# Patient Record
Sex: Male | Born: 1955 | Race: White | Hispanic: No | Marital: Married | Smoking: Former smoker
Health system: Southern US, Community
[De-identification: ages and names within clinical notes are randomized; demographics above are authoritative.]

## PROBLEM LIST (undated history)

## (undated) DIAGNOSIS — N189 Chronic kidney disease, unspecified: Secondary | ICD-10-CM

## (undated) DIAGNOSIS — E785 Hyperlipidemia, unspecified: Secondary | ICD-10-CM

## (undated) DIAGNOSIS — E041 Nontoxic single thyroid nodule: Secondary | ICD-10-CM

## (undated) DIAGNOSIS — I639 Cerebral infarction, unspecified: Secondary | ICD-10-CM

## (undated) DIAGNOSIS — I2510 Atherosclerotic heart disease of native coronary artery without angina pectoris: Secondary | ICD-10-CM

## (undated) DIAGNOSIS — Z87442 Personal history of urinary calculi: Secondary | ICD-10-CM

## (undated) DIAGNOSIS — M1990 Unspecified osteoarthritis, unspecified site: Secondary | ICD-10-CM

## (undated) DIAGNOSIS — I739 Peripheral vascular disease, unspecified: Secondary | ICD-10-CM

## (undated) DIAGNOSIS — I219 Acute myocardial infarction, unspecified: Secondary | ICD-10-CM

## (undated) DIAGNOSIS — Z8673 Personal history of transient ischemic attack (TIA), and cerebral infarction without residual deficits: Secondary | ICD-10-CM

## (undated) HISTORY — DX: Hyperlipidemia, unspecified: E78.5

## (undated) HISTORY — DX: Personal history of transient ischemic attack (TIA), and cerebral infarction without residual deficits: Z86.73

## (undated) HISTORY — DX: Atherosclerotic heart disease of native coronary artery without angina pectoris: I25.10

## (undated) HISTORY — DX: Nontoxic single thyroid nodule: E04.1

## (undated) HISTORY — PX: COLONOSCOPY: SHX174

## (undated) HISTORY — DX: Peripheral vascular disease, unspecified: I73.9

## (undated) HISTORY — DX: Cerebral infarction, unspecified: I63.9

---

## 1997-03-28 DIAGNOSIS — I219 Acute myocardial infarction, unspecified: Secondary | ICD-10-CM

## 1997-03-28 HISTORY — DX: Acute myocardial infarction, unspecified: I21.9

## 2001-03-28 HISTORY — PX: CORONARY ANGIOPLASTY WITH STENT PLACEMENT: SHX49

## 2001-05-03 ENCOUNTER — Ambulatory Visit (HOSPITAL_COMMUNITY): Admission: RE | Admit: 2001-05-03 | Discharge: 2001-05-04 | Payer: Self-pay | Admitting: Dermatology

## 2003-11-26 ENCOUNTER — Emergency Department (HOSPITAL_COMMUNITY): Admission: EM | Admit: 2003-11-26 | Discharge: 2003-11-26 | Payer: Self-pay | Admitting: Family Medicine

## 2004-08-14 ENCOUNTER — Observation Stay (HOSPITAL_COMMUNITY): Admission: EM | Admit: 2004-08-14 | Discharge: 2004-08-14 | Payer: Self-pay | Admitting: Family Medicine

## 2004-08-16 ENCOUNTER — Ambulatory Visit (HOSPITAL_COMMUNITY): Admission: AD | Admit: 2004-08-16 | Discharge: 2004-08-16 | Payer: Self-pay | Admitting: Urology

## 2006-06-02 ENCOUNTER — Observation Stay (HOSPITAL_COMMUNITY): Admission: EM | Admit: 2006-06-02 | Discharge: 2006-06-03 | Payer: Self-pay | Admitting: Emergency Medicine

## 2010-08-04 ENCOUNTER — Ambulatory Visit: Payer: Self-pay | Admitting: Cardiology

## 2010-08-04 ENCOUNTER — Encounter: Payer: Self-pay | Admitting: Cardiology

## 2010-08-04 DIAGNOSIS — E119 Type 2 diabetes mellitus without complications: Secondary | ICD-10-CM | POA: Insufficient documentation

## 2010-08-04 DIAGNOSIS — Z8673 Personal history of transient ischemic attack (TIA), and cerebral infarction without residual deficits: Secondary | ICD-10-CM | POA: Insufficient documentation

## 2010-08-04 DIAGNOSIS — I2510 Atherosclerotic heart disease of native coronary artery without angina pectoris: Secondary | ICD-10-CM | POA: Insufficient documentation

## 2010-08-04 DIAGNOSIS — E785 Hyperlipidemia, unspecified: Secondary | ICD-10-CM | POA: Insufficient documentation

## 2010-08-04 DIAGNOSIS — R0789 Other chest pain: Secondary | ICD-10-CM | POA: Insufficient documentation

## 2010-08-09 ENCOUNTER — Ambulatory Visit (INDEPENDENT_AMBULATORY_CARE_PROVIDER_SITE_OTHER): Payer: Managed Care, Other (non HMO) | Admitting: Cardiology

## 2010-08-09 ENCOUNTER — Encounter: Payer: Self-pay | Admitting: Cardiology

## 2010-08-09 VITALS — BP 122/80 | HR 66 | Ht 67.0 in | Wt 201.2 lb

## 2010-08-09 DIAGNOSIS — I2510 Atherosclerotic heart disease of native coronary artery without angina pectoris: Secondary | ICD-10-CM

## 2010-08-09 DIAGNOSIS — E785 Hyperlipidemia, unspecified: Secondary | ICD-10-CM

## 2010-08-09 NOTE — Assessment & Plan Note (Addendum)
He is asymptomatic at this point. We will continue with risk factor modification and antiplatelet therapy. I will plan on followup again in one year. I would anticipate a stress test at that time. I have encouraged him to increase his aerobic activity.

## 2010-08-09 NOTE — Assessment & Plan Note (Signed)
>>  ASSESSMENT AND PLAN FOR DYSLIPIDEMIA WRITTEN ON 08/09/2010  6:12 PM BY [place], Demetria Pore, MD  We will obtain a copy of his most recent lab work. He is on appropriate statin therapy.

## 2010-08-09 NOTE — Assessment & Plan Note (Signed)
We will obtain a copy of his most recent lab work. He is on appropriate statin therapy.

## 2010-08-09 NOTE — Patient Instructions (Signed)
Try and increase your walking.  We will request a copy of your lab work from Dr. Catha Gosselin.  Continue current medications.  Call if you have any worsening chest pain or shortness of breath.  I will see you back again in 1 year.

## 2010-08-09 NOTE — Progress Notes (Signed)
Mario Arnold Date of Birth: [birth year]   History of Present Illness: [name] is seen for followup today. He was last seen in August of 2010. He states he has done very well since then without any significant cardiac complaints. Occasionally he will note some shortness of breath on exertion he has had no significant chest pain. He reports his diabetes has been under good control. Based on his most recent lab work he was switched to Lipitor.  Current Outpatient Prescriptions on File Prior to Visit  Medication Sig Dispense Refill  . atorvastatin (LIPITOR) 80 MG tablet Take 80 mg by mouth daily.        . Choline Fenofibrate (TRILIPIX) 135 MG capsule Take 135 mg by mouth daily.        [name] dipyridamole-aspirin (AGGRENOX) 25-200 MG per 12 hr capsule Take 1 capsule by mouth 2 (two) times daily.        [name] glyBURIDE (DIABETA) 5 MG tablet Take 5 mg by mouth daily with breakfast.        . metFORMIN (GLUCOPHAGE) 500 MG tablet Take 500 mg by mouth 2 (two) times daily with a meal.        . metoprolol tartrate (LOPRESSOR) 25 MG tablet Take 25 mg by mouth 2 (two) times daily.        . Omega-3 Fatty Acids (FISH OIL) 1000 MG CAPS Take by mouth 4 (four) times daily.        . nitroGLYCERIN (NITROSTAT) 0.4 MG SL tablet Place 0.4 mg under the tongue every 5 (five) minutes as needed.        [name] DISCONTD: rosuvastatin (CRESTOR) 20 MG tablet Take 20 mg by mouth daily.          No Known Allergies  Past Medical History  Diagnosis Date  . Coronary artery disease     STATUS POST ANGIOPLASTY OF THE LAD   . Diabetes mellitus     TYPE 2  . Dyslipidemia   . History of transient ischemic attack (TIA)   . Chest pain, atypical     Past Surgical History  Procedure Date  . Coronary angioplasty with stent placement 2003    LAD. EJECTION FRACTION IS 45%    History  Smoking status  . Former Smoker -- 1.5 packs/day for 23 years  . Types: Cigarettes  . Quit date: 03/28/1997  Smokeless tobacco  . Former Neurosurgeon  .  Types: Snuff, Chew  Comment: as a teen    History  Alcohol Use No    Family History  Problem Relation Age of Onset  . Coronary artery disease Mother   . Heart attack Mother   . Diabetes Mother   . Transient ischemic attack Mother   . Heart disease Mother   . Cancer Father     Review of Systems: The review of systems is positive for chronic bilateral carpal tunnel symptoms. He doesn't want to consider surgery at this time.  All other systems were reviewed and are negative.  Physical Exam: BP 122/80  Pulse 66  Ht 5\' 7"  (1.702 m)  Wt 201 lb 4 oz (91.286 kg)  BMI 31.52 kg/m2 He is a pleasant white male in no acute distress. He is normocephalic, atraumatic. Pupils are equal round and reactive. Sclera clear. Neck is supple without JVD, adenopathy, thyromegaly, or bruits. Lungs are clear. Cardiac exam reveals a regular rate and rhythm without gallop or murmur. Abdomen is soft and nontender. He has no edema. Pedal pulses are good.  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 This is unchanged from July of 2009.  Assessment / Plan:

## 2011-07-18 ENCOUNTER — Encounter: Payer: Self-pay | Admitting: Cardiology

## 2011-07-18 ENCOUNTER — Other Ambulatory Visit: Payer: Self-pay | Admitting: Family Medicine

## 2011-07-18 DIAGNOSIS — I639 Cerebral infarction, unspecified: Secondary | ICD-10-CM

## 2011-07-22 ENCOUNTER — Other Ambulatory Visit: Payer: Managed Care, Other (non HMO)

## 2011-08-10 ENCOUNTER — Ambulatory Visit (INDEPENDENT_AMBULATORY_CARE_PROVIDER_SITE_OTHER): Payer: Managed Care, Other (non HMO) | Admitting: Cardiology

## 2011-08-10 ENCOUNTER — Encounter: Payer: Self-pay | Admitting: Cardiology

## 2011-08-10 ENCOUNTER — Ambulatory Visit
Admission: RE | Admit: 2011-08-10 | Discharge: 2011-08-10 | Disposition: A | Discharge status: Home / Self Care | Payer: Managed Care, Other (non HMO) | Source: Ambulatory Visit | Attending: Family Medicine | Admitting: Family Medicine

## 2011-08-10 VITALS — BP 143/87 | HR 65 | Ht 67.0 in | Wt 200.0 lb

## 2011-08-10 DIAGNOSIS — I2510 Atherosclerotic heart disease of native coronary artery without angina pectoris: Secondary | ICD-10-CM

## 2011-08-10 DIAGNOSIS — I10 Essential (primary) hypertension: Secondary | ICD-10-CM

## 2011-08-10 DIAGNOSIS — I639 Cerebral infarction, unspecified: Secondary | ICD-10-CM

## 2011-08-10 DIAGNOSIS — E785 Hyperlipidemia, unspecified: Secondary | ICD-10-CM

## 2011-08-10 DIAGNOSIS — E119 Type 2 diabetes mellitus without complications: Secondary | ICD-10-CM

## 2011-08-10 DIAGNOSIS — Z8673 Personal history of transient ischemic attack (TIA), and cerebral infarction without residual deficits: Secondary | ICD-10-CM

## 2011-08-10 NOTE — Assessment & Plan Note (Signed)
Agree with the addition of Zetia. Given his significant vascular disease and diabetes his goal LDL is 70.

## 2011-08-10 NOTE — Patient Instructions (Signed)
We will schedule you for a nuclear stress test   Continue your medical therapy.  I will see you again in 1 year.

## 2011-08-10 NOTE — Assessment & Plan Note (Signed)
>>  ASSESSMENT AND PLAN FOR DYSLIPIDEMIA WRITTEN ON 08/10/2011 10:13 AM BY [place], Demetria Pore, MD  Agree with the addition of Zetia. Given his significant vascular disease and diabetes his goal LDL is 70.

## 2011-08-10 NOTE — Assessment & Plan Note (Signed)
He remains asymptomatic other than mild dyspnea. He is on appropriate medical therapy. His last Myoview study was in February of 2010. This demonstrated a fixed apical defect and ejection fraction of 45%. We'll update his stress test at this time with a stress Myoview study.

## 2011-08-10 NOTE — Progress Notes (Signed)
Mario Arnold Date of Birth: 03-Apr-1955   History of Present Illness: Mario Arnold is seen for yearly followup today. He reports that he is generally doing well. He has occasional aches and pains but denies any chest pain. He does complain of a little bit of shortness of breath at times. He works vigorously in his yard but doesn't do any regular exercise. 3 weeks ago he was started on Zetia. He states he has been scheduled for carotid Doppler studies with his history of stroke in 2008.  Current Outpatient Prescriptions on File Prior to Visit  Medication Sig Dispense Refill  . atorvastatin (LIPITOR) 80 MG tablet Take 80 mg by mouth daily.        . Choline Fenofibrate (TRILIPIX) 135 MG capsule Take 135 mg by mouth daily.        [name] dipyridamole-aspirin (AGGRENOX) 25-200 MG per 12 hr capsule Take 1 capsule by mouth 2 (two) times daily.        [name] glyBURIDE (DIABETA) 5 MG tablet Take 5 mg by mouth daily with breakfast.        . metFORMIN (GLUCOPHAGE) 500 MG tablet Take 500 mg by mouth 2 (two) times daily with a meal.        . metoprolol tartrate (LOPRESSOR) 25 MG tablet Take 25 mg by mouth 2 (two) times daily.        . Omega-3 Fatty Acids (FISH OIL) 1000 MG CAPS Take by mouth 4 (four) times daily.        [name] ZETIA 10 MG tablet Take 10 mg by mouth daily.         No Known Allergies  Past Medical History  Diagnosis Date  . Coronary artery disease     STATUS POST ANGIOPLASTY OF THE LAD   . Diabetes mellitus     TYPE 2  . Dyslipidemia   . History of transient ischemic attack (TIA)   . Chest pain, atypical     Past Surgical History  Procedure Date  . Coronary angioplasty with stent placement 2003    LAD. EJECTION FRACTION IS 45%    History  Smoking status  . Former Smoker -- 1.5 packs/day for 23 years  . Types: Cigarettes  . Quit date: 03/28/1997  Smokeless tobacco  . Former Neurosurgeon  . Types: Snuff, Chew  Comment: as a teen    History  Alcohol Use No    Family History  Problem  Relation Age of Onset  . Coronary artery disease Mother   . Heart attack Mother   . Diabetes Mother   . Transient ischemic attack Mother   . Heart disease Mother   . Cancer Father     Review of Systems: As noted in history of present illness.  All other systems were reviewed and are negative.  Physical Exam: BP 143/87  Pulse 65  Ht 5\' 7"  (1.702 m)  Wt 200 lb (90.719 kg)  BMI 31.32 kg/m2 He is a pleasant white male in no acute distress. He is normocephalic, atraumatic. Pupils are equal round and reactive. Sclera clear. Neck is supple without JVD, adenopathy, thyromegaly, or bruits. Lungs are clear. Cardiac exam reveals a regular rate and rhythm without gallop or murmur. Abdomen is soft and nontender. He has no masses or bruits. No hepatosplenomegaly. He has no edema. Pedal pulses are good. He is alert and oriented x3. Cranial nerves II through XII are intact. Skin is warm and dry. LABORATORY DATA: ECG today demonstrates normal sinus rhythm with  possible old septal infarction. This is unchanged. Recent laboratory data reveals a hemoglobin A1c of 7.2%. Total cholesterol 145, triglycerides 151, LDL 96, and HDL 26. Chemistry panel was remarkable for creatinine of 1.36. CBC was normal.  Assessment / Plan:

## 2011-08-10 NOTE — Assessment & Plan Note (Signed)
He has been scheduled for followup carotid Dopplers.

## 2011-08-16 ENCOUNTER — Other Ambulatory Visit: Payer: Self-pay | Admitting: Family Medicine

## 2011-08-16 DIAGNOSIS — E041 Nontoxic single thyroid nodule: Secondary | ICD-10-CM

## 2011-08-17 ENCOUNTER — Ambulatory Visit
Admission: RE | Admit: 2011-08-17 | Discharge: 2011-08-17 | Disposition: A | Discharge status: Home / Self Care | Payer: Managed Care, Other (non HMO) | Source: Ambulatory Visit | Attending: Family Medicine | Admitting: Family Medicine

## 2011-08-17 ENCOUNTER — Ambulatory Visit (HOSPITAL_COMMUNITY): Payer: Managed Care, Other (non HMO) | Attending: Cardiology | Admitting: Radiology

## 2011-08-17 VITALS — BP 131/85 | HR 62 | Ht 67.0 in | Wt 200.0 lb

## 2011-08-17 DIAGNOSIS — E041 Nontoxic single thyroid nodule: Secondary | ICD-10-CM

## 2011-08-17 DIAGNOSIS — E785 Hyperlipidemia, unspecified: Secondary | ICD-10-CM | POA: Insufficient documentation

## 2011-08-17 DIAGNOSIS — I2510 Atherosclerotic heart disease of native coronary artery without angina pectoris: Secondary | ICD-10-CM

## 2011-08-17 DIAGNOSIS — R0602 Shortness of breath: Secondary | ICD-10-CM

## 2011-08-17 DIAGNOSIS — I10 Essential (primary) hypertension: Secondary | ICD-10-CM

## 2011-08-17 DIAGNOSIS — E119 Type 2 diabetes mellitus without complications: Secondary | ICD-10-CM | POA: Insufficient documentation

## 2011-08-17 MED ORDER — TECHNETIUM TC 99M TETROFOSMIN IV KIT
11.0000 | PACK | Freq: Once | INTRAVENOUS | Status: AC | PRN
  Administered 2011-08-17: 11 via INTRAVENOUS

## 2011-08-17 MED ORDER — TECHNETIUM TC 99M TETROFOSMIN IV KIT
33.0000 | PACK | Freq: Once | INTRAVENOUS | Status: AC | PRN
  Administered 2011-08-17: 33 via INTRAVENOUS

## 2011-08-17 NOTE — Progress Notes (Signed)
[facility] SITE 3 NUCLEAR MED [address] [place] [address] [phone]  Cardiology Nuclear Med Study  Mario Arnold is a 55 y.o. male     MRN : [id number]     DOB: 08-Jun-1955  Procedure Date: 08/17/2011  Nuclear Med Background Indication for Stress Test:  Evaluation for Ischemia and PTCA Patency History:  '03 PTCA-LAD; '10 QMV:HQION apical defect, EF=45% Cardiac Risk Factors: Carotid Disease, Family History - CAD, History of Smoking, Lipids, NIDDM, Overweight and TIA  Symptoms:  DOE and Fatigue   Nuclear Pre-Procedure Caffeine/Decaff Intake:  None NPO After: 9:00pm   Lungs:  Clear. IV 0.9% NS with Angio Cath:  20g  IV Site: R Hand  IV Started by:  Bonnita Levan, RN  Chest Size (in):  46 Cup Size: n/a  Height: 5\' 7"  (1.702 m)  Weight:  200 lb (90.719 kg)  BMI:  Body mass index is 31.32 kg/(m^2). Tech Comments:  Patient Aggrenox >72 hrs, held Metoprolol x 24 hours, BS=131 @ 7:30 AM    Nuclear Med Study 1 or 2 day study: 1 day  Stress Test Type:  Stress  Reading MD: Kristeen Miss, MD  Order Authorizing Provider:  Peter [place], MD  Resting Radionuclide: Technetium 49m Tetrofosmin  Resting Radionuclide Dose: 11.0 mCi   Stress Radionuclide:  Technetium 80m Tetrofosmin  Stress Radionuclide Dose: 32.0 mCi           Stress Protocol Rest HR: 62 Stress HR: 154  Rest BP: 131/85 Stress BP: 184/74  Exercise Time (min): 11:01 METS: 13.4   Predicted Max HR: 166 bpm % Max HR: 92.77 bpm Rate Pressure Product: 62952   Dose of Adenosine (mg):  n/a Dose of Lexiscan: n/a mg  Dose of Atropine (mg): n/a Dose of Dobutamine: n/a mcg/kg/min (at max HR)  Stress Test Technologist: Smiley Houseman, CMA-N  Nuclear Technologist:  Domenic Polite, CNMT     Rest Procedure:  Myocardial perfusion imaging was performed at rest 45 minutes following the intravenous administration of Technetium 87m Tetrofosmin.  Rest ECG: Prior SWMI with nonspecific T-wave changes.  Stress  Procedure:  The patient exercised on the treadmill utilizing the Bruce protocol for 11:01 minutes. He then stopped due to fatigue and denied any chest pain.  There were ST-T wave changes, frequent PVC's and two 3-beat runs of v-tach.  Technetium 80m Tetrofosmin was injected at peak exercise and myocardial perfusion imaging was performed after a brief delay.  Stress ECG: No significant change from baseline ECG and The patient developed 1 mm ST depression in the inferior and lateral leads with exercise.  He had many PVCs and several ventricular couplets.  The ST depression evolved into TWI .  QPS Raw Data Images:  Normal; no motion artifact; normal heart/lung ratio. Stress Images:  There is a moderate sized area of moderate attenuation of the mid and distal anterior wall.  The uptake in the other regions is normal. Rest Images:  Normal homogeneous uptake in all areas of the myocardium. Subtraction (SDS):  There is evidence of a moderate sized area of moderate ischemia of the mid and apical anterior wall.  Transient Ischemic Dilatation (Normal <1.22):  1.05 Lung/Heart Ratio (Normal <0.45):  0.29  Quantitative Gated Spect Images QGS EDV:  119 ml QGS ESV:  60 ml  Impression Exercise Capacity:  Excellent exercise capacity. BP Response:  Normal blood pressure response. Clinical Symptoms:  No significant symptoms noted. ECG Impression:  Significant ST abnormalities consistent with ischemia. Comparison with Prior  Nuclear Study: No images to compare  Overall Impression:  Abnormal stress nuclear study.  The ECG response is abnormal.  There is a moderate sized area of moderate ischemia in the mid and apical anterior wall. LV Ejection Fraction: 50%.  LV Wall Motion:  There is mild hypokinesis of the  anterior apical segments.   Mario Arnold, Mario Arnold., MD, [facility] 08/17/2011, 5:57 PM Office - [phone] Pager [phone]

## 2011-08-23 ENCOUNTER — Telehealth: Payer: Self-pay | Admitting: Cardiology

## 2011-08-23 NOTE — Telephone Encounter (Signed)
Please return call to patient regarding 5/22 stress test, he can be reached at [phone]

## 2011-08-23 NOTE — Telephone Encounter (Signed)
Patient called was told abnormal myoview.Dr.Jordan recommends cardiac cath.Patient was told will schedule cath tomorrow 08/24/11 and call him back with date, time and instructions.

## 2011-08-24 ENCOUNTER — Other Ambulatory Visit (INDEPENDENT_AMBULATORY_CARE_PROVIDER_SITE_OTHER): Payer: 59

## 2011-08-24 ENCOUNTER — Other Ambulatory Visit: Payer: Self-pay

## 2011-08-24 ENCOUNTER — Other Ambulatory Visit: Payer: Self-pay | Admitting: Cardiology

## 2011-08-24 ENCOUNTER — Ambulatory Visit
Admission: RE | Admit: 2011-08-24 | Discharge: 2011-08-24 | Disposition: A | Discharge status: Home / Self Care | Payer: 59 | Source: Ambulatory Visit | Attending: Cardiology | Admitting: Cardiology

## 2011-08-24 DIAGNOSIS — I2510 Atherosclerotic heart disease of native coronary artery without angina pectoris: Secondary | ICD-10-CM

## 2011-08-24 LAB — CBC WITH DIFFERENTIAL/PLATELET
Basophils Relative: 0.5 % (ref 0.0–3.0)
Eosinophils Relative: 4.4 % (ref 0.0–5.0)
HCT: 41.8 % (ref 39.0–52.0)
Lymphs Abs: 1.8 10*3/uL (ref 0.7–4.0)
MCV: 87.9 fl (ref 78.0–100.0)
Monocytes Absolute: 0.6 10*3/uL (ref 0.1–1.0)
Platelets: 183 10*3/uL (ref 150.0–400.0)
WBC: 5.3 10*3/uL (ref 4.5–10.5)

## 2011-08-24 LAB — BASIC METABOLIC PANEL
BUN: 22 mg/dL (ref 6–23)
CO2: 27 mEq/L (ref 19–32)
Chloride: 108 mEq/L (ref 96–112)
Potassium: 4.1 mEq/L (ref 3.5–5.1)

## 2011-08-24 NOTE — Telephone Encounter (Signed)
Patient called was told cardiac cath scheduled 08/30/11 JV cath lab at [facility] 9:30 am arrive at 8:30 am.Patient will come by office today 08/24/11 to pick up cath instructions and have lab work, cxr.

## 2011-08-27 DIAGNOSIS — E041 Nontoxic single thyroid nodule: Secondary | ICD-10-CM

## 2011-08-27 DIAGNOSIS — I779 Disorder of arteries and arterioles, unspecified: Secondary | ICD-10-CM

## 2011-08-27 HISTORY — DX: Disorder of arteries and arterioles, unspecified: I77.9

## 2011-08-27 HISTORY — DX: Nontoxic single thyroid nodule: E04.1

## 2011-08-30 ENCOUNTER — Encounter (HOSPITAL_BASED_OUTPATIENT_CLINIC_OR_DEPARTMENT_OTHER)
Admission: RE | Disposition: A | Discharge status: Home / Self Care | Payer: Self-pay | Source: Ambulatory Visit | Attending: Cardiology

## 2011-08-30 ENCOUNTER — Inpatient Hospital Stay (HOSPITAL_BASED_OUTPATIENT_CLINIC_OR_DEPARTMENT_OTHER)
Admission: RE | Admit: 2011-08-30 | Discharge: 2011-08-30 | Disposition: A | Discharge status: Home / Self Care | Payer: 59 | Source: Ambulatory Visit | Attending: Cardiology | Admitting: Cardiology

## 2011-08-30 DIAGNOSIS — I2510 Atherosclerotic heart disease of native coronary artery without angina pectoris: Secondary | ICD-10-CM | POA: Insufficient documentation

## 2011-08-30 DIAGNOSIS — T82897A Other specified complication of cardiac prosthetic devices, implants and grafts, initial encounter: Secondary | ICD-10-CM | POA: Insufficient documentation

## 2011-08-30 DIAGNOSIS — Y831 Surgical operation with implant of artificial internal device as the cause of abnormal reaction of the patient, or of later complication, without mention of misadventure at the time of the procedure: Secondary | ICD-10-CM | POA: Insufficient documentation

## 2011-08-30 DIAGNOSIS — R9439 Abnormal result of other cardiovascular function study: Secondary | ICD-10-CM | POA: Insufficient documentation

## 2011-08-30 LAB — POCT I-STAT GLUCOSE: Glucose, Bld: 118 mg/dL — ABNORMAL HIGH (ref 70–99)

## 2011-08-30 SURGERY — JV LEFT HEART CATHETERIZATION WITH CORONARY ANGIOGRAM
Anesthesia: Moderate Sedation

## 2011-08-30 MED ORDER — ASPIRIN 81 MG PO CHEW
324.0000 mg | CHEWABLE_TABLET | ORAL | Status: AC
  Administered 2011-08-30: 324 mg via ORAL

## 2011-08-30 MED ORDER — SODIUM CHLORIDE 0.9 % IJ SOLN: 3.0000 mL | INTRAMUSCULAR | Status: DC | PRN

## 2011-08-30 MED ORDER — SODIUM CHLORIDE 0.9 % IJ SOLN: 3.0000 mL | Freq: Two times a day (BID) | INTRAMUSCULAR | Status: DC

## 2011-08-30 MED ORDER — SODIUM CHLORIDE 0.9 % IV SOLN: 1.0000 mL/kg/h | INTRAVENOUS | Status: DC

## 2011-08-30 MED ORDER — ACETAMINOPHEN 325 MG PO TABS: 650.0000 mg | ORAL_TABLET | ORAL | Status: DC | PRN

## 2011-08-30 MED ORDER — SODIUM CHLORIDE 0.9 % IV SOLN
INTRAVENOUS | Status: DC
  Administered 2011-08-30: 09:00:00 via INTRAVENOUS

## 2011-08-30 MED ORDER — ONDANSETRON HCL 4 MG/2ML IJ SOLN: 4.0000 mg | Freq: Four times a day (QID) | INTRAMUSCULAR | Status: DC | PRN

## 2011-08-30 MED ORDER — DIAZEPAM 5 MG PO TABS
5.0000 mg | ORAL_TABLET | ORAL | Status: AC
  Administered 2011-08-30: 5 mg via ORAL

## 2011-08-30 MED ORDER — SODIUM CHLORIDE 0.9 % IV SOLN: 250.0000 mL | INTRAVENOUS | Status: DC | PRN

## 2011-08-30 NOTE — Progress Notes (Signed)
Bedrest begins @ 1030.  Tegaderm dressing applied, Dr. [place] in to discuss results with patient and family.

## 2011-08-30 NOTE — H&P (View-Only) (Signed)
 Mario Arnold Date of Birth: 03/20/1956   History of Present Illness: Mario Arnold is seen for yearly followup today. He reports that he is generally doing well. He has occasional aches and pains but denies any chest pain. He does complain of a little bit of shortness of breath at times. He works vigorously in his yard but doesn't do any regular exercise. 3 weeks ago he was started on Zetia. He states he has been scheduled for carotid Doppler studies with his history of stroke in 2008.  Current Outpatient Prescriptions on File Prior to Visit  Medication Sig Dispense Refill  . atorvastatin (LIPITOR) 80 MG tablet Take 80 mg by mouth daily.        . Choline Fenofibrate (TRILIPIX) 135 MG capsule Take 135 mg by mouth daily.        . dipyridamole-aspirin (AGGRENOX) 25-200 MG per 12 hr capsule Take 1 capsule by mouth 2 (two) times daily.        . glyBURIDE (DIABETA) 5 MG tablet Take 5 mg by mouth daily with breakfast.        . metFORMIN (GLUCOPHAGE) 500 MG tablet Take 500 mg by mouth 2 (two) times daily with a meal.        . metoprolol tartrate (LOPRESSOR) 25 MG tablet Take 25 mg by mouth 2 (two) times daily.        . Omega-3 Fatty Acids (FISH OIL) 1000 MG CAPS Take by mouth 4 (four) times daily.        . ZETIA 10 MG tablet Take 10 mg by mouth daily.         No Known Allergies  Past Medical History  Diagnosis Date  . Coronary artery disease     STATUS POST ANGIOPLASTY OF THE LAD   . Diabetes mellitus     TYPE 2  . Dyslipidemia   . History of transient ischemic attack (TIA)   . Chest pain, atypical     Past Surgical History  Procedure Date  . Coronary angioplasty with stent placement 2003    LAD. EJECTION FRACTION IS 45%    History  Smoking status  . Former Smoker -- 1.5 packs/day for 23 years  . Types: Cigarettes  . Quit date: 03/28/1997  Smokeless tobacco  . Former User  . Types: Snuff, Chew  Comment: as a teen    History  Alcohol Use No    Family History  Problem  Relation Age of Onset  . Coronary artery disease Mother   . Heart attack Mother   . Diabetes Mother   . Transient ischemic attack Mother   . Heart disease Mother   . Cancer Father     Review of Systems: As noted in history of present illness.  All other systems were reviewed and are negative.  Physical Exam: BP 143/87  Pulse 65  Ht 5' 7" (1.702 m)  Wt 200 lb (90.719 kg)  BMI 31.32 kg/m2 He is a pleasant white male in no acute distress. He is normocephalic, atraumatic. Pupils are equal round and reactive. Sclera clear. Neck is supple without JVD, adenopathy, thyromegaly, or bruits. Lungs are clear. Cardiac exam reveals a regular rate and rhythm without gallop or murmur. Abdomen is soft and nontender. He has no masses or bruits. No hepatosplenomegaly. He has no edema. Pedal pulses are good. He is alert and oriented x3. Cranial nerves II through XII are intact. Skin is warm and dry. LABORATORY DATA: ECG today demonstrates normal sinus rhythm with   possible old septal infarction. This is unchanged. Recent laboratory data reveals a hemoglobin A1c of 7.2%. Total cholesterol 145, triglycerides 151, LDL 96, and HDL 26. Chemistry panel was remarkable for creatinine of 1.36. CBC was normal.  Assessment / Plan:  

## 2011-08-30 NOTE — CV Procedure (Signed)
   Cardiac Catheterization Procedure Note  Name: Mario Arnold MRN: [id number] DOB: 05-12-1955  Procedure: Left Heart Cath, Selective Coronary Angiography, LV angiography  Indication: 56 year old white male with history of diabetes and hyperlipidemia. He has a history of remote stenting of the LAD in 2003. Recent stress Myoview study demonstrated anterior apical ischemia with mildly reduced left ventricular function.   Procedural details: The right groin was prepped, draped, and anesthetized with 1% lidocaine. Using modified Seldinger technique, a 4 French sheath was introduced into the right femoral artery. Standard Judkins catheters were used for coronary angiography and left ventriculography. Catheter exchanges were performed over a guidewire. There were no immediate procedural complications. The patient was transferred to the post catheterization recovery area for further monitoring.  Procedural Findings: Hemodynamics:  AO 115/68 with a mean of 88 mmHg LV 110/15 mm of birth   Coronary angiography: Coronary dominance: right  Left mainstem: There is mild tapering of the distal left main less than 20%.  Left anterior descending (LAD): The LAD is a large vessel which extends around the apex. There is a stent in the proximal to mid vessel. Within the stent there is one focal area of 50-60% narrowing at the takeoff of the first septal perforator.  There is a modest sized intermediate branch which has a 70% stenosis proximally.  Left circumflex (LCx): The left circumflex is relatively small vessel that courses predominantly in the AV groove. Has no significant disease.   Right coronary artery (RCA): The right coronary is a large dominant vessel. Has somewhat diffuse disease throughout the proximal to mid vessel up to 30%.  Left ventriculography: Left ventricular systolic function is abnormal, there is moderate apical hypokinesis, LVEF is estimated at 50 %, there is no significant mitral  regurgitation   Final Conclusions:   1. Moderate two-vessel coronary disease. There is a focal 50-60% in-stent stenosis in the LAD. There is a 70% stenosis in the intermediate branch. 2. Mild left ventricular dysfunction.  Recommendations: We will review this patient's data. He has been asymptomatic so medical therapy may be a reasonable option.  Malaiah Viramontes [facility] 08/30/2011, 10:05 AM

## 2011-08-30 NOTE — Interval H&P Note (Signed)
History and Physical Interval Note:  08/30/2011 9:39 AM  Mario Arnold  has presented today for surgery, with the diagnosis of abn myoview  The various methods of treatment have been discussed with the patient and family. After consideration of risks, benefits and other options for treatment, the patient has consented to  Procedure(s) (LRB): JV LEFT HEART CATHETERIZATION WITH CORONARY ANGIOGRAM (N/A) as a surgical intervention .  The patients' history has been reviewed, patient examined, no change in status, stable for surgery.  I have reviewed the patients' chart and labs.  Questions were answered to the patient's satisfaction.     Theron Arista [facility] 08/30/2011 9:39 AM

## 2011-09-13 ENCOUNTER — Encounter: Payer: Self-pay | Admitting: Nurse Practitioner

## 2011-09-13 ENCOUNTER — Ambulatory Visit (INDEPENDENT_AMBULATORY_CARE_PROVIDER_SITE_OTHER): Payer: 59 | Admitting: Nurse Practitioner

## 2011-09-13 VITALS — BP 140/82 | HR 60 | Ht 67.0 in | Wt 200.0 lb

## 2011-09-13 DIAGNOSIS — I2510 Atherosclerotic heart disease of native coronary artery without angina pectoris: Secondary | ICD-10-CM

## 2011-09-13 DIAGNOSIS — E041 Nontoxic single thyroid nodule: Secondary | ICD-10-CM

## 2011-09-13 DIAGNOSIS — Z8673 Personal history of transient ischemic attack (TIA), and cerebral infarction without residual deficits: Secondary | ICD-10-CM

## 2011-09-13 LAB — TSH: TSH: 2.71 u[IU]/mL (ref 0.35–5.50)

## 2011-09-13 MED ORDER — ISOSORBIDE MONONITRATE ER 30 MG PO TB24: 30.0000 mg | ORAL_TABLET | Freq: Every day | ORAL | Status: DC

## 2011-09-13 NOTE — Patient Instructions (Signed)
We are going to try you on Imdur 30 mg a day for the next 2 weeks  Dr. [place] and I will see you back in about 2 weeks  We are going to check your TSH today to make sure its ok since you have this thyroid nodule.  Call the [facility] office at [phone] if you have any questions, problems or concerns.

## 2011-09-13 NOTE — Assessment & Plan Note (Signed)
He is on aggrenox with no recurrent symptoms.

## 2011-09-13 NOTE — Progress Notes (Signed)
Mario Arnold Date of Birth: Feb 09, 1956 Medical Record #[id number]  History of Present Illness: Mario Arnold is seen today for a post cath visit. He is seen for Dr. Swaziland. He has known CAD with recent abnormal Myoview. He has had remote PCI of the LAD in 2003. Recent cath showed moderate 2 vessel disease with an EF of 50% and no significant MR. He is going to be managed medically. He had been fairly asymptomatic. His other issues include HTN, HLD and DM. He has known carotid disease with recent study performed. He has had a remote mini stroke about 5 or 6 years ago with no residual. He is on aggrenox.   He comes in today. He is here with his wife. He is doing well post cath. No problems with his groin reported. No chest pain. Doesn't exercise regularly but works in his yard fairly vigorously. He is having what he thinks is more "giving out" with working and hurrying and DOE. His wife thinks he may be more symptomatic than he has previously admitted too. He is doing more since his father in law has had recent CABG.  Does not know what his A1C is running. Did get his carotid study. Had a thyroid nodule with a subsequent ultrasound which is noted below. Has not had a TSH checked.   Current Outpatient Prescriptions on File Prior to Visit  Medication Sig Dispense Refill  . atorvastatin (LIPITOR) 80 MG tablet Take 80 mg by mouth daily.        . Choline Fenofibrate (TRILIPIX) 135 MG capsule Take 135 mg by mouth daily.        [name] dipyridamole-aspirin (AGGRENOX) 25-200 MG per 12 hr capsule Take 1 capsule by mouth 2 (two) times daily.        [name] glyBURIDE (DIABETA) 5 MG tablet Take 5 mg by mouth daily with breakfast.        . metFORMIN (GLUCOPHAGE) 500 MG tablet Take 500 mg by mouth 2 (two) times daily with a meal.        . metoprolol tartrate (LOPRESSOR) 25 MG tablet Take 25 mg by mouth 2 (two) times daily.        . Omega-3 Fatty Acids (FISH OIL) 1000 MG CAPS Take by mouth 4 (four) times daily.        [name] ZETIA 10  MG tablet Take 10 mg by mouth daily.         No Known Allergies  Past Medical History  Diagnosis Date  . Coronary artery disease     STATUS POST ANGIOPLASTY OF THE LAD   . Diabetes mellitus     TYPE 2  . Dyslipidemia   . History of transient ischemic attack (TIA)   . Chest pain, atypical     Past Surgical History  Procedure Date  . Coronary angioplasty with stent placement 2003    LAD. EJECTION FRACTION IS 45%    History  Smoking status  . Former Smoker -- 1.5 packs/day for 23 years  . Types: Cigarettes  . Quit date: 03/28/1997  Smokeless tobacco  . Former Neurosurgeon  . Types: Snuff, Chew  Comment: as a teen    History  Alcohol Use No    Family History  Problem Relation Age of Onset  . Coronary artery disease Mother   . Heart attack Mother   . Diabetes Mother   . Transient ischemic attack Mother   . Heart disease Mother   . Cancer Father  Review of Systems: The review of systems is per the HPI.  All other systems were reviewed and are negative.  Physical Exam: BP 140/82  Pulse 60  Ht 5\' 7"  (1.702 m)  Wt 200 lb (90.719 kg)  BMI 31.32 kg/m2  SpO2 96% Patient is very pleasant and in no acute distress. Skin is warm and dry. Color is normal.  HEENT is unremarkable. Normocephalic/atraumatic. PERRL. Sclera are nonicteric. Neck is supple. No masses. No JVD. Lungs are clear. Cardiac exam shows a regular rate and rhythm. Abdomen is soft. Extremities are without edema. Gait and ROM are intact. No gross neurologic deficits noted.   LABORATORY DATA: TSH is pending for today.   Lab Results  Component Value Date   WBC 5.3 08/24/2011   HGB 13.7 08/24/2011   HCT 41.8 08/24/2011   PLT 183.0 08/24/2011   GLUCOSE 118* 08/30/2011   NA 142 08/24/2011   K 4.1 08/24/2011   CL 108 08/24/2011   CREATININE 1.5 08/24/2011   BUN 22 08/24/2011   CO2 27 08/24/2011   INR 1.0 08/24/2011   No results found for this basename: TSH,  T3TOTAL,  T4TOTAL,  THYROIDAB    Cardiac Cath Final  Conclusions:  1. Moderate two-vessel coronary disease. There is a focal 50-60% in-stent stenosis in the LAD. There is a 70% stenosis in the intermediate branch.  2. Mild left ventricular dysfunction.   Recommendations: We will review this patient's data. He has been asymptomatic so medical therapy may be a reasonable option.   CAROTID DOPPLER IMPRESSION: Less than 50% stenosis in the right and left internal carotid arteries.  Sub centimeter left thyroid hypoechoic lesion. Dedicated thyroid ultrasound can be performed to complete the thyroid examination.   NECK ULTRASOUND IMPRESSION: 4 mm small cystic nodule in the left lower pole. Findings do not meet current SRU consensus criteria for biopsy. Follow-up by clinical exam is recommended. If patient has known risk factors for thyroid carcinoma, consider follow-up ultrasound in 12 months. If patient is clinically hyperthyroid, consider nuclear medicine thyroid uptake and scan.      Assessment / Plan:

## 2011-09-13 NOTE — Assessment & Plan Note (Signed)
Patient is s/p recent cath with the results as noted. He seems more symptomatic since his last visit here. I have started him on Imdur 30 mg a day. Cautioned him about headache. He is not on ED drugs. Will see him back in 2 weeks with Dr. Swaziland. If we do not see improvement, then I think we need to consider attempts at PCI. We will also check a TSH today to follow up on the thyroid nodule. Patient is agreeable to this plan and will call if any problems develop in the interim.

## 2011-09-13 NOTE — Assessment & Plan Note (Signed)
Most recent A1C that I see was 7.2

## 2011-09-20 ENCOUNTER — Ambulatory Visit: Payer: 59 | Admitting: Cardiology

## 2011-09-30 ENCOUNTER — Ambulatory Visit: Payer: 59 | Admitting: Nurse Practitioner

## 2011-10-06 ENCOUNTER — Telehealth: Payer: Self-pay

## 2011-10-06 ENCOUNTER — Encounter: Payer: Self-pay | Admitting: Nurse Practitioner

## 2011-10-06 ENCOUNTER — Ambulatory Visit (INDEPENDENT_AMBULATORY_CARE_PROVIDER_SITE_OTHER): Payer: 59 | Admitting: Nurse Practitioner

## 2011-10-06 VITALS — BP 118/82 | HR 64 | Ht 67.0 in | Wt 199.8 lb

## 2011-10-06 DIAGNOSIS — I2510 Atherosclerotic heart disease of native coronary artery without angina pectoris: Secondary | ICD-10-CM

## 2011-10-06 DIAGNOSIS — Z8673 Personal history of transient ischemic attack (TIA), and cerebral infarction without residual deficits: Secondary | ICD-10-CM

## 2011-10-06 DIAGNOSIS — E041 Nontoxic single thyroid nodule: Secondary | ICD-10-CM | POA: Insufficient documentation

## 2011-10-06 DIAGNOSIS — I779 Disorder of arteries and arterioles, unspecified: Secondary | ICD-10-CM

## 2011-10-06 MED ORDER — ISOSORBIDE MONONITRATE ER 60 MG PO TB24: 60.0000 mg | ORAL_TABLET | ORAL | Status: DC

## 2011-10-06 NOTE — Assessment & Plan Note (Signed)
He has a 4 mm small cystic nodule in the left lower pole. Findings did not meed current criteria for biopsy. His TSH was normal. He will need a repeat ultrasound in 12 months. Need to let his PCP know as well.

## 2011-10-06 NOTE — Progress Notes (Signed)
[name] Date of Birth: 25-Jun-1955 Medical Record #[id number]  History of Present Illness: Carlous is seen back today for a 2 week check. He is seen for Dr. Swaziland. He has known CAD with recent abnormal Myoview. Has had remote PCI of the LAD in 2003. Recent cath showed moderate 2 vessel disease with an EF of 50% and no significant MR. He is going to be managed medically but attempts at PCI could be undertaken. He is now on Imdur. His other issues include known carotid disease, a thyroid nodule with normal TSH, prior TIA on aggrenox, DM, HTN and HLD.   He comes in today. He is here with his wife. I placed him on Imdur 2 weeks ago. He seemed to be more symptomatic than originally thought. He says his at least 75% better. Breathing has improved. He still gets tired but it is mostly in the evening. Chest tightness has improved. Will have some tightness if he really over exerts himself. He is still having to manage another household since his father in law has had recent CABG. No headache reported.   Current Outpatient Prescriptions on File Prior to Visit  Medication Sig Dispense Refill  . atorvastatin (LIPITOR) 80 MG tablet Take 80 mg by mouth daily.        . Choline Fenofibrate (TRILIPIX) 135 MG capsule Take 135 mg by mouth daily.        [name] dipyridamole-aspirin (AGGRENOX) 25-200 MG per 12 hr capsule Take 1 capsule by mouth 2 (two) times daily.        [name] glyBURIDE (DIABETA) 5 MG tablet Take 5 mg by mouth daily with breakfast.        . isosorbide mononitrate (IMDUR) 30 MG 24 hr tablet Take 1 tablet (30 mg total) by mouth daily.  30 tablet  3  . metFORMIN (GLUCOPHAGE) 500 MG tablet Take 500 mg by mouth 2 (two) times daily with a meal.        . metoprolol tartrate (LOPRESSOR) 25 MG tablet Take 25 mg by mouth 2 (two) times daily.        [name] ZETIA 10 MG tablet Take 10 mg by mouth daily.       . Omega-3 Fatty Acids (FISH OIL) 1000 MG CAPS Take by mouth 4 (four) times daily.          No Known  Allergies  Past Medical History  Diagnosis Date  . Coronary artery disease     STATUS POST ANGIOPLASTY OF THE LAD   . Diabetes mellitus     TYPE 2  . Dyslipidemia   . History of transient ischemic attack (TIA)     on aggrenox  . Chest pain, atypical     Past Surgical History  Procedure Date  . Coronary angioplasty with stent placement 2003    LAD. EJECTION FRACTION IS 45%    History  Smoking status  . Former Smoker -- 1.5 packs/day for 23 years  . Types: Cigarettes  . Quit date: 03/28/1997  Smokeless tobacco  . Former Neurosurgeon  . Types: Snuff, Chew  Comment: as a teen    History  Alcohol Use No    Family History  Problem Relation Age of Onset  . Coronary artery disease Mother   . Heart attack Mother   . Diabetes Mother   . Transient ischemic attack Mother   . Heart disease Mother   . Cancer Father     Review of Systems: The review of systems is per  the HPI.  All other systems were reviewed and are negative.  Physical Exam: BP 118/82  Pulse 64  Ht 5\' 7"  (1.702 m)  Wt 199 lb 12.8 oz (90.629 kg)  BMI 31.29 kg/m2 Patient is very pleasant and in no acute distress. Skin is warm and dry. Color is normal.  HEENT is unremarkable. Normocephalic/atraumatic. PERRL. Sclera are nonicteric. Neck is supple. No masses. No JVD. Lungs are clear. Cardiac exam shows a regular rate and rhythm. Abdomen is soft. Extremities are without edema. Gait and ROM are intact. No gross neurologic deficits noted.   LABORATORY DATA:   Assessment / Plan:

## 2011-10-06 NOTE — Assessment & Plan Note (Signed)
Would need a repeat duplex next year.

## 2011-10-06 NOTE — Patient Instructions (Addendum)
Let's increase the Imdur to 60 mg a day  We will need to have your thyroid rechecked with ultrasound next June  Dr. Swaziland will see you in a month.  Stay on your other medicines.  Call the [facility] office at [phone] if you have any questions, problems or concerns.

## 2011-10-06 NOTE — Assessment & Plan Note (Signed)
He reports at least a 75% improvement in symptoms with the addition of nitrate therapy. I have increased it to 60 mg a day. We will see him back in one month to reassess. Patient is agreeable to this plan and will call if any problems develop in the interim.

## 2011-10-06 NOTE — Telephone Encounter (Signed)
Mario Arnold's office note from 10/06/11 faxed to patient's PCP Dr.Kevin Little.

## 2011-10-06 NOTE — Assessment & Plan Note (Signed)
On aggrenox with no recurrent symptoms.

## 2011-10-17 ENCOUNTER — Other Ambulatory Visit: Payer: Self-pay | Admitting: Cardiology

## 2011-11-18 ENCOUNTER — Ambulatory Visit (INDEPENDENT_AMBULATORY_CARE_PROVIDER_SITE_OTHER): Payer: 59 | Admitting: Cardiology

## 2011-11-18 ENCOUNTER — Encounter: Payer: Self-pay | Admitting: Cardiology

## 2011-11-18 VITALS — BP 120/80 | HR 68 | Ht 67.0 in | Wt 200.8 lb

## 2011-11-18 DIAGNOSIS — I2510 Atherosclerotic heart disease of native coronary artery without angina pectoris: Secondary | ICD-10-CM

## 2011-11-18 DIAGNOSIS — E785 Hyperlipidemia, unspecified: Secondary | ICD-10-CM

## 2011-11-18 NOTE — Progress Notes (Signed)
Mario Arnold Date of Birth: [birth year] Medical Record #[id number]  History of Present Illness: Mario Arnold is seen back today for a followup visit. He has known CAD with  abnormal Myoview. Has had remote PCI of the LAD in 2003. Recent cath showed moderate 2 vessel disease with a 50% in-stent restenosis of the proximal LAD. There was also a 70% lesion in an intermediate branch.  EF of 50% and no significant MR.  His other issues include known carotid disease, a thyroid nodule with normal TSH, prior TIA on aggrenox, DM, HTN and HLD. With reinstitution of isosorbide his symptoms of dyspnea have resolved. He has no chest pain. He is very at and sometimes works up to 10 hours a day. He states he is feeling much better.  Current Outpatient Prescriptions on File Prior to Visit  Medication Sig Dispense Refill  . atorvastatin (LIPITOR) 80 MG tablet Take 80 mg by mouth daily.        [name] dipyridamole-aspirin (AGGRENOX) 25-200 MG per 12 hr capsule Take 1 capsule by mouth 2 (two) times daily.        [name] glyBURIDE (DIABETA) 5 MG tablet Take 5 mg by mouth daily with breakfast.        . isosorbide mononitrate (IMDUR) 60 MG 24 hr tablet Take 1 tablet (60 mg total) by mouth every morning.  30 tablet  11  . metFORMIN (GLUCOPHAGE) 500 MG tablet Take 500 mg by mouth 2 (two) times daily with a meal.        . metoprolol tartrate (LOPRESSOR) 25 MG tablet Take 25 mg by mouth 2 (two) times daily.        . Omega-3 Fatty Acids (FISH OIL) 1000 MG CAPS Take by mouth 4 (four) times daily.        . TRILIPIX 135 MG capsule TAKE ONE CAPSULE BY MOUTH EVERY DAY  30 each  5  . ZETIA 10 MG tablet Take 10 mg by mouth daily.         No Known Allergies  Past Medical History  Diagnosis Date  . Coronary artery disease     STATUS POST ANGIOPLASTY OF THE LAD   . Diabetes mellitus     TYPE 2  . Dyslipidemia   . History of transient ischemic attack (TIA)     on aggrenox  . Thyroid nodule June 2013    had [place] with 4 mm small cystic nodule  noted. Needs follow up US in 12 months. TSH is normal.   . Carotid disease, bilateral June 2013    less than 50% stenosis in the right and left     Past Surgical History  Procedure Date  . Coronary angioplasty with stent placement 2003    LAD. EJECTION FRACTION IS 45%    History  Smoking status  . Former Smoker -- 1.5 packs/day for 23 years  . Types: Cigarettes  . Quit date: 03/28/1997  Smokeless tobacco  . Former Neurosurgeon  . Types: Snuff, Chew  Comment: as a teen    History  Alcohol Use No    Family History  Problem Relation Age of Onset  . Coronary artery disease Mother   . Heart attack Mother   . Diabetes Mother   . Transient ischemic attack Mother   . Heart disease Mother   . Cancer Father     Review of Systems: The review of systems is per the HPI.  All other systems were reviewed and are negative.  Physical Exam: BP  120/80  Pulse 68  Ht 5\' 7"  (1.702 m)  Wt 200 lb 12.8 oz (91.082 kg)  BMI 31.45 kg/m2  SpO2 95% Patient is very pleasant and in no acute distress. Skin is warm and dry. Color is normal.  HEENT is unremarkable. Normocephalic/atraumatic. PERRL. Sclera are nonicteric. Neck is supple. No masses. No JVD. Lungs are clear. Cardiac exam shows a regular rate and rhythm. Abdomen is soft. Extremities are without edema. Gait and ROM are intact. No gross neurologic deficits noted.   LABORATORY DATA:   Assessment / Plan: 1. Coronary disease status post remote stenting of the proximal LAD. He has modest in-stent restenosis and modest disease in an intermediate branch. Given his excellent clinical response to medical treatment no intervention indicated at this time.  2. Carotid arterial disease. Recommend followup Doppler study next year.  3. Diabetes mellitus type 2.  4. Hyperlipidemia.

## 2011-11-18 NOTE — Patient Instructions (Signed)
Continue your current medication.  I will see you again in 6 months.   

## 2012-03-29 ENCOUNTER — Telehealth: Payer: Self-pay | Admitting: Cardiology

## 2012-03-29 NOTE — Telephone Encounter (Signed)
New problem:    trilipix 135 mg.  Price has went up . Can he get an gentric form or can the rx be call in as DAW  As brand. To get a reduce in price.

## 2012-03-29 NOTE — Telephone Encounter (Signed)
Pt. states that Trilipix has gone up in price again. He wants to know if there is another medication he can take that is not as costly or if Dr. Swaziland indicates medication to be dispensed as written he can get for $50.00 monthly. Please advise.   Pt. given 42 tablets of sample Trilipix as he states he has ran out.

## 2012-03-31 NOTE — Telephone Encounter (Signed)
May substitute fenofibrate 160 mg daily for Trilipix.  Kishia Shackett Sw[place]and MD, [facility]

## 2012-04-02 MED ORDER — FENOFIBRATE 160 MG PO TABS: 160.0000 mg | ORAL_TABLET | Freq: Every day | ORAL | Status: DC

## 2012-04-02 NOTE — Telephone Encounter (Signed)
Patient called was told Dr.Jordan okayed when finishes trilipix start fenofibrate 160 mg daily.

## 2012-04-02 NOTE — Addendum Note (Signed)
Addended by: Meda Klinefelter D on: 04/02/2012 08:44 AM   Modules accepted: Orders

## 2012-07-27 ENCOUNTER — Ambulatory Visit (HOSPITAL_COMMUNITY)
Admission: RE | Admit: 2012-07-27 | Discharge: 2012-07-27 | Disposition: A | Discharge status: Home / Self Care | Payer: 59 | Source: Ambulatory Visit | Attending: Orthopedic Surgery | Admitting: Orthopedic Surgery

## 2012-07-27 ENCOUNTER — Other Ambulatory Visit (HOSPITAL_COMMUNITY): Payer: Self-pay | Admitting: Orthopedic Surgery

## 2012-07-27 DIAGNOSIS — IMO0002 Reserved for concepts with insufficient information to code with codable children: Secondary | ICD-10-CM

## 2012-07-27 DIAGNOSIS — Z1389 Encounter for screening for other disorder: Secondary | ICD-10-CM | POA: Insufficient documentation

## 2012-08-09 ENCOUNTER — Other Ambulatory Visit: Payer: Self-pay | Admitting: Orthopedic Surgery

## 2012-08-13 ENCOUNTER — Telehealth: Payer: Self-pay | Admitting: Cardiology

## 2012-08-13 NOTE — Telephone Encounter (Signed)
New problem   Pt is having rotator cuff sx on 09/05/12 and need to know what medications he need to stop taking before his sx. Please call pt

## 2012-08-13 NOTE — Telephone Encounter (Signed)
Returned call to patient no answer.LMTC. 

## 2012-08-21 ENCOUNTER — Encounter (HOSPITAL_COMMUNITY): Payer: Self-pay | Admitting: Pharmacy Technician

## 2012-08-22 NOTE — Telephone Encounter (Signed)
Returned call to patient Dr.Jordan advised continue all medication.Does not need to hold any medication before his rotator cuff surgery.

## 2012-08-22 NOTE — Telephone Encounter (Signed)
Follow up   Pt need to know if there is any medications he need to stop taking before your sx. Please call pt.

## 2012-08-23 ENCOUNTER — Other Ambulatory Visit: Payer: Self-pay | Admitting: Family Medicine

## 2012-08-23 DIAGNOSIS — E041 Nontoxic single thyroid nodule: Secondary | ICD-10-CM

## 2012-08-27 ENCOUNTER — Encounter (HOSPITAL_COMMUNITY)
Admission: RE | Admit: 2012-08-27 | Discharge: 2012-08-27 | Disposition: A | Discharge status: Home / Self Care | Payer: PRIVATE HEALTH INSURANCE | Source: Ambulatory Visit | Attending: Orthopedic Surgery | Admitting: Orthopedic Surgery

## 2012-08-27 ENCOUNTER — Encounter (HOSPITAL_COMMUNITY): Payer: Self-pay

## 2012-08-27 ENCOUNTER — Ambulatory Visit (HOSPITAL_COMMUNITY)
Admission: RE | Admit: 2012-08-27 | Discharge: 2012-08-27 | Disposition: A | Discharge status: Home / Self Care | Payer: PRIVATE HEALTH INSURANCE | Source: Ambulatory Visit | Attending: Orthopedic Surgery | Admitting: Orthopedic Surgery

## 2012-08-27 DIAGNOSIS — Z01818 Encounter for other preprocedural examination: Secondary | ICD-10-CM | POA: Insufficient documentation

## 2012-08-27 DIAGNOSIS — I252 Old myocardial infarction: Secondary | ICD-10-CM | POA: Insufficient documentation

## 2012-08-27 DIAGNOSIS — Z01810 Encounter for preprocedural cardiovascular examination: Secondary | ICD-10-CM | POA: Insufficient documentation

## 2012-08-27 DIAGNOSIS — Z01812 Encounter for preprocedural laboratory examination: Secondary | ICD-10-CM | POA: Insufficient documentation

## 2012-08-27 HISTORY — DX: Unspecified osteoarthritis, unspecified site: M19.90

## 2012-08-27 HISTORY — DX: Acute myocardial infarction, unspecified: I21.9

## 2012-08-27 LAB — BASIC METABOLIC PANEL
BUN: 17 mg/dL (ref 6–23)
CO2: 25 mEq/L (ref 19–32)
Calcium: 9.8 mg/dL (ref 8.4–10.5)
Chloride: 106 mEq/L (ref 96–112)
Creatinine, Ser: 1.39 mg/dL — ABNORMAL HIGH (ref 0.50–1.35)

## 2012-08-27 LAB — CBC
HCT: 43.1 % (ref 39.0–52.0)
MCH: 29.4 pg (ref 26.0–34.0)
MCV: 87.2 fL (ref 78.0–100.0)
Platelets: 204 10*3/uL (ref 150–400)
RBC: 4.94 MIL/uL (ref 4.22–5.81)

## 2012-08-27 NOTE — Progress Notes (Signed)
Stress test 5/13 EPIC,   Phone clearance note Dr Swaziland with note not to stop blood thinners EPIC.  Patient states was told not to stop

## 2012-08-27 NOTE — Patient Instructions (Addendum)
20 Mario Arnold  08/27/2012   Your procedure is scheduled on:  09/05/12  [facility]  Report to [facility] at 0630      AM.  Call this number if you have problems the morning of surgery: [phone]       Remember:   Do not eat food  Or drink :After Midnight. Tuesday NIGHT   Take these medicines the morning of surgery with A SIP OF WATER:  Isosorbide,  Metoprolol DO NOT TAKE ANY DIABETES MEDICATION MORNING OF SURGERY  .  Contacts, dentures or partial plates can not be worn to surgery  Leave suitcase in the car. After surgery it may be brought to your room.  For patients admitted to the hospital, checkout time is 11:00 AM day of  discharge.             SPECIAL INSTRUCTIONS- SEE [facility] PREPARING FOR SURGERY INSTRUCTION SHEET-     DO NOT WEAR JEWELRY, LOTIONS, POWDERS, OR PERFUMES.  WOMEN-- DO NOT SHAVE LEGS OR UNDERARMS FOR 12 HOURS BEFORE SHOWERS. MEN MAY SHAVE FACE.  Patients discharged the day of surgery will not be allowed to drive home. IF going home the day of surgery, you must have a driver and someone to stay with you for the first 24 hours  Name and phone number of your driver:      Mario Arnold   wife                                                                  Please read over the following fact sheets that you were given: MRSA Information, Incentive Spirometry Sheet                                                                                  [name]  PST [phone]  [phone]                 FAILURE TO FOLLOW THESE INSTRUCTIONS MAY RESULT IN  CANCELLATION   OF YOUR SURGERY                                                  Patient Signature _____________________________

## 2012-08-27 NOTE — Progress Notes (Signed)
SURGICAL CLEARANCE NOTE DR Swaziland ON CHART

## 2012-09-05 ENCOUNTER — Ambulatory Visit (HOSPITAL_COMMUNITY): Payer: PRIVATE HEALTH INSURANCE | Admitting: Anesthesiology

## 2012-09-05 ENCOUNTER — Encounter (HOSPITAL_COMMUNITY): Payer: Self-pay

## 2012-09-05 ENCOUNTER — Ambulatory Visit (HOSPITAL_COMMUNITY)
Admission: RE | Admit: 2012-09-05 | Discharge: 2012-09-06 | Disposition: A | Discharge status: Home / Self Care | Payer: PRIVATE HEALTH INSURANCE | Source: Ambulatory Visit | Attending: Orthopedic Surgery | Admitting: Orthopedic Surgery

## 2012-09-05 ENCOUNTER — Encounter (HOSPITAL_COMMUNITY): Payer: Self-pay | Admitting: Anesthesiology

## 2012-09-05 ENCOUNTER — Encounter (HOSPITAL_COMMUNITY)
Admission: RE | Disposition: A | Discharge status: Home / Self Care | Payer: Self-pay | Source: Ambulatory Visit | Attending: Orthopedic Surgery

## 2012-09-05 DIAGNOSIS — M24119 Other articular cartilage disorders, unspecified shoulder: Secondary | ICD-10-CM | POA: Insufficient documentation

## 2012-09-05 DIAGNOSIS — M719 Bursopathy, unspecified: Secondary | ICD-10-CM | POA: Insufficient documentation

## 2012-09-05 DIAGNOSIS — I2510 Atherosclerotic heart disease of native coronary artery without angina pectoris: Secondary | ICD-10-CM | POA: Insufficient documentation

## 2012-09-05 DIAGNOSIS — E785 Hyperlipidemia, unspecified: Secondary | ICD-10-CM | POA: Insufficient documentation

## 2012-09-05 DIAGNOSIS — M19019 Primary osteoarthritis, unspecified shoulder: Secondary | ICD-10-CM | POA: Insufficient documentation

## 2012-09-05 DIAGNOSIS — Z79899 Other long term (current) drug therapy: Secondary | ICD-10-CM | POA: Insufficient documentation

## 2012-09-05 DIAGNOSIS — I252 Old myocardial infarction: Secondary | ICD-10-CM | POA: Insufficient documentation

## 2012-09-05 DIAGNOSIS — I739 Peripheral vascular disease, unspecified: Secondary | ICD-10-CM | POA: Insufficient documentation

## 2012-09-05 DIAGNOSIS — M67919 Unspecified disorder of synovium and tendon, unspecified shoulder: Secondary | ICD-10-CM | POA: Insufficient documentation

## 2012-09-05 DIAGNOSIS — E119 Type 2 diabetes mellitus without complications: Secondary | ICD-10-CM | POA: Insufficient documentation

## 2012-09-05 DIAGNOSIS — Z8673 Personal history of transient ischemic attack (TIA), and cerebral infarction without residual deficits: Secondary | ICD-10-CM | POA: Insufficient documentation

## 2012-09-05 HISTORY — PX: SHOULDER ARTHROSCOPY WITH OPEN ROTATOR CUFF REPAIR AND DISTAL CLAVICLE ACROMINECTOMY: SHX5683

## 2012-09-05 LAB — GLUCOSE, CAPILLARY
Glucose-Capillary: 159 mg/dL — ABNORMAL HIGH (ref 70–99)
Glucose-Capillary: 160 mg/dL — ABNORMAL HIGH (ref 70–99)
Glucose-Capillary: 195 mg/dL — ABNORMAL HIGH (ref 70–99)

## 2012-09-05 SURGERY — SHOULDER ARTHROSCOPY WITH OPEN ROTATOR CUFF REPAIR AND DISTAL CLAVICLE ACROMINECTOMY
Anesthesia: General | Site: Shoulder | Laterality: Right | Wound class: Clean

## 2012-09-05 MED ORDER — PROMETHAZINE HCL 25 MG/ML IJ SOLN: 6.2500 mg | INTRAMUSCULAR | Status: DC | PRN

## 2012-09-05 MED ORDER — HYDROCODONE-ACETAMINOPHEN 5-325 MG PO TABS
1.0000 | ORAL_TABLET | ORAL | Status: DC | PRN
  Administered 2012-09-05 – 2012-09-06 (×3): 1 via ORAL
  Administered 2012-09-06: 2 via ORAL
  Administered 2012-09-06: 1 via ORAL
  Filled 2012-09-05 (×3): qty 1
  Filled 2012-09-05: qty 2
  Filled 2012-09-05: qty 1

## 2012-09-05 MED ORDER — ACETAMINOPHEN 650 MG RE SUPP: 650.0000 mg | Freq: Four times a day (QID) | RECTAL | Status: DC | PRN

## 2012-09-05 MED ORDER — KETOROLAC TROMETHAMINE 30 MG/ML IJ SOLN
INTRAMUSCULAR | Status: AC
  Filled 2012-09-05: qty 1

## 2012-09-05 MED ORDER — DIPHENHYDRAMINE HCL 12.5 MG/5ML PO ELIX: 12.5000 mg | ORAL_SOLUTION | Freq: Four times a day (QID) | ORAL | Status: DC | PRN

## 2012-09-05 MED ORDER — ATORVASTATIN CALCIUM 80 MG PO TABS
80.0000 mg | ORAL_TABLET | Freq: Every day | ORAL | Status: DC
  Administered 2012-09-05: 80 mg via ORAL
  Filled 2012-09-05 (×2): qty 1

## 2012-09-05 MED ORDER — ONDANSETRON HCL 4 MG/2ML IJ SOLN
4.0000 mg | Freq: Four times a day (QID) | INTRAMUSCULAR | Status: DC | PRN
  Administered 2012-09-05: 4 mg via INTRAVENOUS

## 2012-09-05 MED ORDER — ASPIRIN-DIPYRIDAMOLE ER 25-200 MG PO CP12
1.0000 | ORAL_CAPSULE | Freq: Two times a day (BID) | ORAL | Status: DC
  Administered 2012-09-05 – 2012-09-06 (×2): 1 via ORAL
  Filled 2012-09-05 (×3): qty 1

## 2012-09-05 MED ORDER — POVIDONE-IODINE 7.5 % EX SOLN
Freq: Once | CUTANEOUS | Status: AC
  Administered 2012-09-05: 07:00:00 via TOPICAL

## 2012-09-05 MED ORDER — EPINEPHRINE HCL 1 MG/ML IJ SOLN
INTRAMUSCULAR | Status: DC | PRN
  Administered 2012-09-05 (×2): 1 mg

## 2012-09-05 MED ORDER — MORPHINE SULFATE 2 MG/ML IJ SOLN
2.0000 mg | INTRAMUSCULAR | Status: DC | PRN
  Administered 2012-09-06: 2 mg via INTRAVENOUS
  Filled 2012-09-05: qty 1

## 2012-09-05 MED ORDER — FENOFIBRATE 160 MG PO TABS
160.0000 mg | ORAL_TABLET | Freq: Every day | ORAL | Status: DC
  Administered 2012-09-05: 160 mg via ORAL
  Filled 2012-09-05 (×2): qty 1

## 2012-09-05 MED ORDER — MIDAZOLAM HCL 5 MG/5ML IJ SOLN
INTRAMUSCULAR | Status: DC | PRN
  Administered 2012-09-05: 2 mg via INTRAVENOUS

## 2012-09-05 MED ORDER — ROPIVACAINE HCL 5 MG/ML IJ SOLN
INTRAMUSCULAR | Status: DC | PRN
  Administered 2012-09-05: 30 mL

## 2012-09-05 MED ORDER — ONDANSETRON HCL 4 MG/2ML IJ SOLN
INTRAMUSCULAR | Status: DC | PRN
  Administered 2012-09-05: 4 mg via INTRAVENOUS

## 2012-09-05 MED ORDER — ROPIVACAINE HCL 5 MG/ML IJ SOLN
INTRAMUSCULAR | Status: AC
  Filled 2012-09-05: qty 30

## 2012-09-05 MED ORDER — PROPOFOL 10 MG/ML IV BOLUS
INTRAVENOUS | Status: DC | PRN
  Administered 2012-09-05: 170 mg via INTRAVENOUS
  Administered 2012-09-05: 30 mg via INTRAVENOUS

## 2012-09-05 MED ORDER — KETOROLAC TROMETHAMINE 30 MG/ML IJ SOLN
15.0000 mg | Freq: Once | INTRAMUSCULAR | Status: AC | PRN
  Administered 2012-09-05: 30 mg via INTRAVENOUS

## 2012-09-05 MED ORDER — HYDROMORPHONE 0.3 MG/ML IV SOLN
INTRAVENOUS | Status: AC
  Filled 2012-09-05: qty 25

## 2012-09-05 MED ORDER — METFORMIN HCL 500 MG PO TABS
500.0000 mg | ORAL_TABLET | Freq: Two times a day (BID) | ORAL | Status: DC
  Administered 2012-09-05 – 2012-09-06 (×2): 500 mg via ORAL
  Filled 2012-09-05 (×4): qty 1

## 2012-09-05 MED ORDER — CEFAZOLIN SODIUM-DEXTROSE 2-3 GM-% IV SOLR
INTRAVENOUS | Status: AC
  Filled 2012-09-05: qty 50

## 2012-09-05 MED ORDER — HYDROMORPHONE HCL PF 1 MG/ML IJ SOLN: 0.5000 mg | INTRAMUSCULAR | Status: DC | PRN

## 2012-09-05 MED ORDER — DIPHENHYDRAMINE HCL 50 MG/ML IJ SOLN: 12.5000 mg | Freq: Four times a day (QID) | INTRAMUSCULAR | Status: DC | PRN

## 2012-09-05 MED ORDER — MENTHOL 3 MG MT LOZG
1.0000 | LOZENGE | OROMUCOSAL | Status: DC | PRN
  Filled 2012-09-05: qty 9

## 2012-09-05 MED ORDER — ISOSORBIDE MONONITRATE ER 60 MG PO TB24
60.0000 mg | ORAL_TABLET | Freq: Every day | ORAL | Status: DC
  Administered 2012-09-06: 60 mg via ORAL
  Filled 2012-09-05: qty 1

## 2012-09-05 MED ORDER — NALOXONE HCL 0.4 MG/ML IJ SOLN: 0.4000 mg | INTRAMUSCULAR | Status: DC | PRN

## 2012-09-05 MED ORDER — METOCLOPRAMIDE HCL 5 MG/ML IJ SOLN: 5.0000 mg | Freq: Three times a day (TID) | INTRAMUSCULAR | Status: DC | PRN

## 2012-09-05 MED ORDER — BUPIVACAINE-EPINEPHRINE 0.5% -1:200000 IJ SOLN
INTRAMUSCULAR | Status: AC
  Filled 2012-09-05: qty 1

## 2012-09-05 MED ORDER — OXYCODONE-ACETAMINOPHEN 5-325 MG PO TABS
1.0000 | ORAL_TABLET | ORAL | Status: DC | PRN
  Administered 2012-09-06 (×2): 1 via ORAL
  Filled 2012-09-05 (×2): qty 1

## 2012-09-05 MED ORDER — SUCCINYLCHOLINE CHLORIDE 20 MG/ML IJ SOLN
INTRAMUSCULAR | Status: DC | PRN
  Administered 2012-09-05: 100 mg via INTRAVENOUS

## 2012-09-05 MED ORDER — SODIUM CHLORIDE 0.9 % IV SOLN
INTRAVENOUS | Status: DC
  Administered 2012-09-05: 17:00:00 via INTRAVENOUS

## 2012-09-05 MED ORDER — GLYBURIDE 5 MG PO TABS
5.0000 mg | ORAL_TABLET | Freq: Every day | ORAL | Status: DC
  Administered 2012-09-06: 5 mg via ORAL
  Filled 2012-09-05 (×2): qty 1

## 2012-09-05 MED ORDER — PHENYLEPHRINE HCL 10 MG/ML IJ SOLN
INTRAMUSCULAR | Status: DC | PRN
  Administered 2012-09-05 (×3): 40 ug via INTRAVENOUS

## 2012-09-05 MED ORDER — HYDROMORPHONE HCL PF 1 MG/ML IJ SOLN
0.2500 mg | INTRAMUSCULAR | Status: DC | PRN
  Administered 2012-09-05 (×2): 0.5 mg via INTRAVENOUS

## 2012-09-05 MED ORDER — EPINEPHRINE HCL 1 MG/ML IJ SOLN
INTRAMUSCULAR | Status: AC
Start: 2012-09-05 — End: 2012-09-05
  Filled 2012-09-05: qty 2

## 2012-09-05 MED ORDER — METOPROLOL TARTRATE 25 MG PO TABS
25.0000 mg | ORAL_TABLET | Freq: Two times a day (BID) | ORAL | Status: DC
  Administered 2012-09-05 – 2012-09-06 (×2): 25 mg via ORAL
  Filled 2012-09-05 (×3): qty 1

## 2012-09-05 MED ORDER — METOCLOPRAMIDE HCL 10 MG PO TABS: 5.0000 mg | ORAL_TABLET | Freq: Three times a day (TID) | ORAL | Status: DC | PRN

## 2012-09-05 MED ORDER — LACTATED RINGERS IV SOLN
INTRAVENOUS | Status: DC | PRN
  Administered 2012-09-05 (×2): via INTRAVENOUS

## 2012-09-05 MED ORDER — EZETIMIBE 10 MG PO TABS
10.0000 mg | ORAL_TABLET | Freq: Every day | ORAL | Status: DC
  Administered 2012-09-05: 10 mg via ORAL
  Filled 2012-09-05 (×2): qty 1

## 2012-09-05 MED ORDER — ONDANSETRON HCL 4 MG/2ML IJ SOLN
4.0000 mg | Freq: Four times a day (QID) | INTRAMUSCULAR | Status: DC | PRN
  Filled 2012-09-05: qty 2

## 2012-09-05 MED ORDER — HYDROMORPHONE 0.3 MG/ML IV SOLN
INTRAVENOUS | Status: DC
  Administered 2012-09-05: 0.3 mg via INTRAVENOUS
  Administered 2012-09-05: 11:00:00 via INTRAVENOUS

## 2012-09-05 MED ORDER — CEFAZOLIN SODIUM-DEXTROSE 2-3 GM-% IV SOLR
2.0000 g | Freq: Four times a day (QID) | INTRAVENOUS | Status: AC
  Administered 2012-09-05 – 2012-09-06 (×3): 2 g via INTRAVENOUS
  Filled 2012-09-05 (×3): qty 50

## 2012-09-05 MED ORDER — LACTATED RINGERS IV SOLN: INTRAVENOUS | Status: DC

## 2012-09-05 MED ORDER — PHENOL 1.4 % MT LIQD
1.0000 | OROMUCOSAL | Status: DC | PRN
  Filled 2012-09-05: qty 177

## 2012-09-05 MED ORDER — METHOCARBAMOL 100 MG/ML IJ SOLN
500.0000 mg | Freq: Four times a day (QID) | INTRAMUSCULAR | Status: DC | PRN
  Administered 2012-09-05: 500 mg via INTRAVENOUS
  Filled 2012-09-05: qty 5

## 2012-09-05 MED ORDER — METHOCARBAMOL 500 MG PO TABS
500.0000 mg | ORAL_TABLET | Freq: Four times a day (QID) | ORAL | Status: DC | PRN
  Administered 2012-09-05 – 2012-09-06 (×3): 500 mg via ORAL
  Filled 2012-09-05 (×3): qty 1

## 2012-09-05 MED ORDER — BUPIVACAINE-EPINEPHRINE 0.5% -1:200000 IJ SOLN
INTRAMUSCULAR | Status: DC | PRN
  Administered 2012-09-05: 6 mL

## 2012-09-05 MED ORDER — PHENYLEPHRINE HCL 10 MG/ML IJ SOLN
10.0000 mg | INTRAVENOUS | Status: DC | PRN
  Administered 2012-09-05: 40 ug/min via INTRAVENOUS

## 2012-09-05 MED ORDER — LIDOCAINE HCL (CARDIAC) 20 MG/ML IV SOLN
INTRAVENOUS | Status: DC | PRN
  Administered 2012-09-05: 80 mg via INTRAVENOUS

## 2012-09-05 MED ORDER — CEFAZOLIN SODIUM-DEXTROSE 2-3 GM-% IV SOLR
2.0000 g | INTRAVENOUS | Status: AC
  Administered 2012-09-05: 2 g via INTRAVENOUS

## 2012-09-05 MED ORDER — ONDANSETRON HCL 4 MG PO TABS: 4.0000 mg | ORAL_TABLET | Freq: Four times a day (QID) | ORAL | Status: DC | PRN

## 2012-09-05 MED ORDER — HYDROMORPHONE HCL PF 1 MG/ML IJ SOLN
INTRAMUSCULAR | Status: AC
  Filled 2012-09-05: qty 1

## 2012-09-05 MED ORDER — BUPIVACAINE-EPINEPHRINE (PF) 0.5% -1:200000 IJ SOLN
INTRAMUSCULAR | Status: AC
  Filled 2012-09-05: qty 10

## 2012-09-05 MED ORDER — FENTANYL CITRATE 0.05 MG/ML IJ SOLN
INTRAMUSCULAR | Status: DC | PRN
  Administered 2012-09-05: 50 ug via INTRAVENOUS

## 2012-09-05 MED ORDER — SODIUM CHLORIDE 0.9 % IJ SOLN: 9.0000 mL | INTRAMUSCULAR | Status: DC | PRN

## 2012-09-05 MED ORDER — ACETAMINOPHEN 325 MG PO TABS: 650.0000 mg | ORAL_TABLET | Freq: Four times a day (QID) | ORAL | Status: DC | PRN

## 2012-09-05 MED ORDER — LACTATED RINGERS IR SOLN
Status: DC | PRN
  Administered 2012-09-05 (×2): 3000 mL

## 2012-09-05 SURGICAL SUPPLY — 57 items
ANCH SUT 2 5.5 BABSR ASCP (Orthopedic Implant) ×1 IMPLANT
ANCHOR PEEK ZIP 5.5 NDL NO2 (Orthopedic Implant) ×1 IMPLANT
APL SKNCLS STERI-STRIP NONHPOA (GAUZE/BANDAGES/DRESSINGS) ×1
BENZOIN TINCTURE PRP APPL 2/3 (GAUZE/BANDAGES/DRESSINGS) ×2 IMPLANT
BLADE 4.2CUDA (BLADE) ×2 IMPLANT
BLADE OSCILLATING/SAGITTAL (BLADE) ×2
BLADE SURG SZ11 CARB STEEL (BLADE) ×2 IMPLANT
BLADE SW THK.38XMED LNG THN (BLADE) IMPLANT
BOOTIES KNEE HIGH SLOAN (MISCELLANEOUS) ×4 IMPLANT
BUR OVAL 4.0 (BURR) ×2 IMPLANT
CLOTH BEACON ORANGE TIMEOUT ST (SAFETY) ×2 IMPLANT
DRAPE LG THREE QUARTER DISP (DRAPES) ×2 IMPLANT
DRAPE SHOULDER BEACH CHAIR (DRAPES) ×2 IMPLANT
DRAPE U-SHAPE 47X51 STRL (DRAPES) ×2 IMPLANT
DRSG ADAPTIC 3X8 NADH LF (GAUZE/BANDAGES/DRESSINGS) ×2 IMPLANT
DRSG EMULSION OIL 3X3 NADH (GAUZE/BANDAGES/DRESSINGS) ×1 IMPLANT
DRSG PAD ABDOMINAL 8X10 ST (GAUZE/BANDAGES/DRESSINGS) ×2 IMPLANT
DURAPREP 26ML APPLICATOR (WOUND CARE) ×2 IMPLANT
ELECT KIT MENISCUS BASIC 165 (SET/KITS/TRAYS/PACK) IMPLANT
ELECT REM PT RETURN 9FT ADLT (ELECTROSURGICAL) ×2
ELECTRODE REM PT RTRN 9FT ADLT (ELECTROSURGICAL) ×1 IMPLANT
GLOVE BIOGEL PI IND STRL 8 (GLOVE) ×1 IMPLANT
GLOVE BIOGEL PI INDICATOR 8 (GLOVE) ×1
GLOVE ECLIPSE 8.0 STRL XLNG CF (GLOVE) ×2 IMPLANT
GLOVE INDICATOR 8.0 STRL GRN (GLOVE) ×4 IMPLANT
GOWN STRL REIN XL XLG (GOWN DISPOSABLE) ×4 IMPLANT
MANIFOLD NEPTUNE II (INSTRUMENTS) ×2 IMPLANT
NDL MA TROC 1/2 (NEEDLE) IMPLANT
NDL MA TROC 1/2 CIR (NEEDLE) IMPLANT
NDL SAFETY ECLIPSE 18X1.5 (NEEDLE) ×1 IMPLANT
NDL SPNL 18GX3.5 QUINCKE PK (NEEDLE) ×1 IMPLANT
NEEDLE HYPO 18GX1.5 SHARP (NEEDLE) ×2
NEEDLE MA TROC 1/2 (NEEDLE) IMPLANT
NEEDLE MA TROC 1/2 CIR (NEEDLE) IMPLANT
NEEDLE SPNL 18GX3.5 QUINCKE PK (NEEDLE) ×2 IMPLANT
PACK SHOULDER CUSTOM OPM052 (CUSTOM PROCEDURE TRAY) ×2 IMPLANT
POSITIONER SURGICAL ARM (MISCELLANEOUS) ×2 IMPLANT
SET ARTHROSCOPY TUBING (MISCELLANEOUS) ×2
SET ARTHROSCOPY TUBING LN (MISCELLANEOUS) ×1 IMPLANT
SLING ARM IMMOBILIZER LRG (SOFTGOODS) ×2 IMPLANT
SLING ARM IMMOBILIZER MED (SOFTGOODS) IMPLANT
SPONGE GAUZE 4X4 12PLY (GAUZE/BANDAGES/DRESSINGS) ×2 IMPLANT
SPONGE LAP 4X18 X RAY DECT (DISPOSABLE) ×1 IMPLANT
STAPLER VISISTAT (STAPLE) IMPLANT
STRIP CLOSURE SKIN 1/2X4 (GAUZE/BANDAGES/DRESSINGS) IMPLANT
SUCTION FRAZIER TIP 10 FR DISP (SUCTIONS) ×2 IMPLANT
SUT BONE WAX W31G (SUTURE) ×3 IMPLANT
SUT ETHIBOND NAB CT1 #1 30IN (SUTURE) ×2 IMPLANT
SUT ETHILON 4 0 PS 2 18 (SUTURE) ×2 IMPLANT
SUT VIC AB 1 CT1 27 (SUTURE) ×4
SUT VIC AB 1 CT1 27XBRD ANTBC (SUTURE) ×2 IMPLANT
SUT VIC AB 2-0 CT1 27 (SUTURE) ×4
SUT VIC AB 2-0 CT1 27XBRD (SUTURE) ×2 IMPLANT
SYR 3ML LL SCALE MARK (SYRINGE) ×2 IMPLANT
TAPE CLOTH SURG 6X10 WHT LF (GAUZE/BANDAGES/DRESSINGS) ×1 IMPLANT
TUBING CONNECTING 10 (TUBING) ×2 IMPLANT
WAND 90 DEG TURBOVAC W/CORD (SURGICAL WAND) ×2 IMPLANT

## 2012-09-05 NOTE — Anesthesia Postprocedure Evaluation (Signed)
  Anesthesia Post-op Note  Patient: Lynnwood L Putz  Procedure(s) Performed: Procedure(s) (LRB): RIGHT SHOULDER ARTHROSCOPY WITH LABRAL DEBRIDEMENT AND OPEN DISTAL CLAVICLE RESECTION, acromionectomy  AND OPEN ROTATOR CUFF REPAIR (Right)  Patient Location: PACU  Anesthesia Type: General  Level of Consciousness: awake and alert   Airway and Oxygen Therapy: Patient Spontanous Breathing  Post-op Pain: mild  Post-op Assessment: Post-op Vital signs reviewed, Patient's Cardiovascular Status Stable, Respiratory Function Stable, Patent Airway and No signs of Nausea or vomiting  Last Vitals:  Filed Vitals:   09/05/12 1044  BP:   Pulse:   Temp: 36.4 C  Resp:     Post-op Vital Signs: stable   Complications: No apparent anesthesia complications

## 2012-09-05 NOTE — Transfer of Care (Signed)
Immediate Anesthesia Transfer of Care Note  Patient: Mario Arnold  Procedure(s) Performed: Procedure(s) (LRB): RIGHT SHOULDER ARTHROSCOPY WITH LABRAL DEBRIDEMENT AND OPEN DISTAL CLAVICLE RESECTION AND OPEN ROTATOR CUFF REPAIR (Right)  Patient Location: PACU  Anesthesia Type: General  Level of Consciousness: sedated, patient cooperative and responds to stimulaton  Airway & Oxygen Therapy: Patient Spontanous Breathing and Patient connected to face mask oxgen  Post-op Assessment: Report given to PACU RN and Post -op Vital signs reviewed and stable  Post vital signs: Reviewed and stable  Complications: No apparent anesthesia complications

## 2012-09-05 NOTE — Anesthesia Procedure Notes (Signed)
Anesthesia Regional Block:  Interscalene brachial plexus block  Pre-Anesthetic Checklist: ,, timeout performed, Correct Patient, Correct Site, Correct Laterality, Correct Procedure, Correct Position, site marked, Risks and benefits discussed,  Surgical consent,  Pre-op evaluation,  At surgeon's request and post-op pain management  Laterality: Right  Prep: chloraprep       Needles:  Injection technique: Single-shot  Needle Type: Echogenic Needle     Needle Length:cm 9 cm Needle Gauge: 21 G    Additional Needles:  Procedures: ultrasound guided (picture in chart) Interscalene brachial plexus block Narrative:  Injection made incrementally with aspirations every 5 mL.  Performed by: Personally  Anesthesiologist: Eilene Ghazi MD  Additional Notes: Patient tolerated the procedure well without complications  Interscalene brachial plexus block

## 2012-09-05 NOTE — Anesthesia Preprocedure Evaluation (Signed)
Anesthesia Evaluation  Patient identified by MRN, date of birth, ID band Patient awake    Reviewed: Allergy & Precautions, H&P , NPO status , Patient's Chart, lab work & pertinent test results  Airway Mallampati: II TM Distance: <3 FB Neck ROM: Full    Dental no notable dental hx.    Pulmonary former smoker,  breath sounds clear to auscultation  Pulmonary exam normal       Cardiovascular + CAD, + Past MI, + Cardiac Stents and + Peripheral Vascular Disease Rhythm:Regular Rate:Normal     Neuro/Psych negative neurological ROS  negative psych ROS   GI/Hepatic negative GI ROS, Neg liver ROS,   Endo/Other  diabetes, Oral Hypoglycemic Agents  Renal/GU negative Renal ROS  negative genitourinary   Musculoskeletal negative musculoskeletal ROS (+)   Abdominal   Peds negative pediatric ROS (+)  Hematology negative hematology ROS (+)   Anesthesia Other Findings   Reproductive/Obstetrics negative OB ROS                           Anesthesia Physical Anesthesia Plan  ASA: III  Anesthesia Plan: General   Post-op Pain Management:    Induction: Intravenous  Airway Management Planned: Oral ETT  Additional Equipment:   Intra-op Plan:   Post-operative Plan: Extubation in OR  Informed Consent: I have reviewed the patients History and Physical, chart, labs and discussed the procedure including the risks, benefits and alternatives for the proposed anesthesia with the patient or authorized representative who has indicated his/her understanding and acceptance.   Dental advisory given  Plan Discussed with: CRNA and Surgeon  Anesthesia Plan Comments:         Anesthesia Quick Evaluation

## 2012-09-05 NOTE — H&P (Signed)
Mario Arnold is an 57 y.o. male.   Chief Complaint:painful rt shoulder HPI:MRI demonstrates labral tear, ac arthritis, and torn rotator cuff  Past Medical History  Diagnosis Date  . Coronary artery disease     STATUS POST ANGIOPLASTY OF THE LAD   . Diabetes mellitus     TYPE 2  . Dyslipidemia   . History of transient ischemic attack (TIA)     on aggrenox  . Thyroid nodule June 2013    had [place] with 4 mm small cystic nodule noted. Needs follow up US in 12 months. TSH is normal.   . Carotid disease, bilateral June 2013    less than 50% stenosis in the right and left   . Arthritis   . Myocardial infarction 1999    stent placement- per patient has 2 stents    Past Surgical History  Procedure Laterality Date  . Coronary angioplasty with stent placement  2003    LAD. EJECTION FRACTION IS 45%    Family History  Problem Relation Age of Onset  . Coronary artery disease Mother   . Heart attack Mother   . Diabetes Mother   . Transient ischemic attack Mother   . Heart disease Mother   . Cancer Father    Social History:  reports that he quit smoking about 15 years ago. His smoking use included Cigarettes. He has a 34.5 pack-year smoking history. He has quit using smokeless tobacco. His smokeless tobacco use included Snuff and Chew. He reports that  drinks alcohol. He reports that he does not use illicit drugs.  Allergies: No Known Allergies  Medications Prior to Admission  Medication Sig Dispense Refill  . atorvastatin (LIPITOR) 80 MG tablet Take 80 mg by mouth every evening.       . dipyridamole-aspirin (AGGRENOX) 25-200 MG per 12 hr capsule Take 1 capsule by mouth 2 (two) times daily.       [name] ezetimibe (ZETIA) 10 MG tablet Take 10 mg by mouth every evening.      . fenofibrate 160 MG tablet Take 160 mg by mouth every evening.      . fish oil-omega-3 fatty acids 1000 MG capsule Take 1 g by mouth 2 (two) times daily. States has been instructed date to stop      . glyBURIDE (DIABETA)  5 MG tablet Take 5 mg by mouth daily with breakfast.       . isosorbide mononitrate (IMDUR) 60 MG 24 hr tablet Take 60 mg by mouth every evening.      . metFORMIN (GLUCOPHAGE) 500 MG tablet Take 500 mg by mouth 2 (two) times daily with a meal.       . metoprolol tartrate (LOPRESSOR) 25 MG tablet Take 25 mg by mouth 2 (two) times daily.         Results for orders placed during the hospital encounter of 09/05/12 (from the past 48 hour(s))  GLUCOSE, CAPILLARY     Status: Abnormal   Collection Time    09/05/12  6:48 AM      Result Value Range   Glucose-Capillary 159 (*) 70 - 99 mg/dL   No results found.  ROS  Blood pressure 124/79, pulse 67, temperature 97.6 F (36.4 C), temperature source Oral, resp. rate 11, SpO2 93.00%. Physical Exam  Constitutional: He is oriented to person, place, and time. He appears well-developed and well-nourished.  HENT:  Head: Normocephalic and atraumatic.  Right Ear: External ear normal.  Left Ear: External ear normal.  Nose: Nose normal.  Mouth/Throat: Oropharynx is clear and moist.  Eyes: Conjunctivae and EOM are normal. Pupils are equal, round, and reactive to light.  Neck: Normal range of motion. Neck supple.  Cardiovascular: Normal rate, regular rhythm, normal heart sounds and intact distal pulses.   Respiratory: Effort normal and breath sounds normal.  GI: Soft. Bowel sounds are normal.  Musculoskeletal:  He has had an interscalene block rt shoulder  Neurological: He is alert and oriented to person, place, and time. He has normal reflexes.  Skin: Skin is warm and dry.  Psychiatric: He has a normal mood and affect. His behavior is normal. Judgment and thought content normal.     Assessment/Plan Torn labrum, rotator cuff, ac arthritis Rt shoulder arthroscopy with labral debridement;open distal clavicle resection, anterior acromionectomy and repair torn rotator cuff  APLINGTON,JAMES P 09/05/2012, 8:32 AM

## 2012-09-06 ENCOUNTER — Encounter (HOSPITAL_COMMUNITY): Payer: Self-pay | Admitting: Orthopedic Surgery

## 2012-09-06 MED ORDER — METHOCARBAMOL 500 MG PO TABS: 500.0000 mg | ORAL_TABLET | Freq: Four times a day (QID) | ORAL | Status: DC

## 2012-09-06 MED ORDER — OXYCODONE-ACETAMINOPHEN 7.5-325 MG PO TABS: 1.0000 | ORAL_TABLET | ORAL | Status: DC | PRN

## 2012-09-06 NOTE — Progress Notes (Signed)
Patient ID: Mario Arnold, male   DOB: Jan 14, 1956, 57 y.o.   MRN: [id number] Doing well.  Afebrile and comfortable on oral pain meds.  Will discharge.  Inst given on use of shoulder immobilizer, bathing, and followup.

## 2012-09-06 NOTE — Evaluation (Signed)
Occupational Therapy Evaluation Patient Details Name: Mario Arnold MRN: [id number] DOB: [birth year] Today's Date: 09/06/2012 Time: 7829-5621 OT Time Calculation (min): 22 min  OT Assessment / Plan / Recommendation Clinical Impression  Pt is s/p R shoulder surgery and all education completed with pt and family for shoulder care. No further questions or concerns.     OT Assessment  Patient does not need any further OT services;Progress rehab of shoulder as ordered by MD at follow-up appointment    Follow Up Recommendations  No OT follow up;Supervision/Assistance - 24 hour    Barriers to Discharge      Equipment Recommendations  None recommended by OT    Recommendations for Other Services    Frequency       Precautions / Restrictions Precautions Precautions: Shoulder Precaution Booklet Issued: Yes (comment) Precaution Comments: Issued shoulder care handout and reviewed with pt and family member Required Braces or Orthoses: Other Brace/Splint Other Brace/Splint: R shoulder immobilizer Restrictions Weight Bearing Restrictions: Yes RUE Weight Bearing: Non weight bearing   Pertinent Vitals/Pain 5/10 with activity. Was 3/10 at rest. Pt states he has had pain meds.    ADL  ADL Comments: Wife practiced with don/doff sling and don shirt as well as UB bathing. Reviewed shoulder care handout with pt and wife. No further questions. Pt overall at mod assist with bathing and dressing mostly for UB care.     OT Diagnosis:    OT Problem List:   OT Treatment Interventions:     OT Goals    Visit Information  Last OT Received On: 09/06/12 Assistance Needed: +1    Subjective Data  Subjective: I washed up this morning Patient Stated Goal: none stated. pt agreeable to OT   Prior Functioning     Home Living Lives With: Spouse Available Help at Discharge: Family Dominant Hand: Right         Vision/Perception     Cognition       Extremity/Trunk Assessment        Mobility       Exercise Donning/doffing shirt without moving shoulder: Caregiver independent with task Method for sponge bathing under operated UE: Caregiver independent with task Donning/doffing sling/immobilizer: Caregiver independent with task Correct positioning of sling/immobilizer: Caregiver independent with task Sling wearing schedule (on at all times/off for ADL's): Caregiver independent with task Proper positioning of operated UE when showering: Caregiver independent with task Positioning of UE while sleeping: Caregiver independent with task   Balance     End of Session OT - End of Session Activity Tolerance: Patient tolerated treatment well Patient left: in chair;with call bell/phone within reach;with family/visitor present  GO Functional Assessment Tool Used: clinical judgement Functional Limitation: Self care Self Care Current Status (H0865): At least 20 percent but less than 40 percent impaired, limited or restricted Self Care Goal Status (H8469): At least 20 percent but less than 40 percent impaired, limited or restricted Self Care Discharge Status [phone]): At least 20 percent but less than 40 percent impaired, limited or restricted   Lennox Laity [phone] 09/06/2012, 11:27 AM

## 2012-09-06 NOTE — Care Management Note (Signed)
    Page 1 of 1   09/06/2012     2:34:50 PM   CARE MANAGEMENT NOTE 09/06/2012  Patient:  Mario Arnold, Mario Arnold   Account Number:  [id number]  Date Initiated:  09/06/2012  Documentation initiated by:  Colleen Can  Subjective/Objective Assessment:   dx rt shoulder rotator cuff tear repair     Action/Plan:   CM spoke with patient and spouse. PLANS ARE FOR return TO HIS HOME in [facility] where spouse will be caregiver. He has no HH or DME needs.   Anticipated DC Date:  09/06/2012   Anticipated DC Plan:  HOME/SELF CARE      DC Planning Services  CM consult      Choice offered to / List presented to:             Status of service:  Completed, signed off Medicare Important Message given?   (If response is "NO", the following Medicare IM given date fields will be blank) Date Medicare IM given:   Date Additional Medicare IM given:    Discharge Disposition:  HOME/SELF CARE  Per UR Regulation:    If discussed at Long Length of Stay Meetings, dates discussed:    Comments:

## 2012-09-06 NOTE — Op Note (Signed)
NAMEZEKI, BEDROSIAN NO.:  [id number]  MEDICAL RECORD NO.:  [id number]  LOCATION:  1609                         FACILITY:  [facility]  PHYSICIAN:  Marlowe Kays, M.D.  DATE OF BIRTH:  01-27-1956  DATE OF PROCEDURE:  09/05/2012 DATE OF DISCHARGE:                              OPERATIVE REPORT   PREOPERATIVE DIAGNOSES: 1. Labral tear. 2. Acromioclavicular joint arthritis. 3. Torn rotator cuff, right shoulder.  POSTOPERATIVE DIAGNOSES: 1. Labral tear. 2. Acromioclavicular joint arthritis. 3. Torn rotator cuff, right shoulder.  OPERATION: 1. Right shoulder arthroscopy with debridement of labrum. 2. Open distal clavicle resection. 3. Open anterior acromionectomy with repair of torn rotator cuff.  SURGEON:  Marlowe Kays, M.D.  ASSISTANTDruscilla Brownie. Cherlynn June.  ANESTHESIA:  General preceded by interscalene block.  PATHOLOGY AND JUSTIFICATION FOR PROCEDURE:  Painful right shoulder with MRI demonstrating preoperative diagnoses, which were confirmed on surgery.  We had obtained Medical and Cardiology clearance prior to the case.  Mr. Angie Fava assistance was necessary because of the complexity of the surgery, including using the arm and system and instrumentation.  PROCEDURE:  Satisfactory interscalene block by Anesthesia, prophylactic antibiotics, satisfied general anesthesia, placed in the sliding frame in the beach-chair position.  Right shoulder girdle was prepped with DuraPrep, and draped in sterile field.  Time-out was performed.  I then marked out the anatomy of the shoulder and for hemostatic purposes infiltrated posterior soft spot portal location and the proposed surgical incision with Marcaine with adrenaline.  Through posterior soft spot portal I atraumatically entered the glenohumeral joint with the major finding being fairly significant tear of the labrum with 1 large fragment of rotator cuff going into the joint.  Accordingly, the  biceps tendon looked unremarkable.  I advanced the scope between the biceps tendon and subscapularis and using a switching stick, made an anterior incision over it and placed a metal cannula into the joint followed by 4.2 shaver.  I then debrided down the labrum removing all the large strands that were interposed in the joint.  Then withdrew all fluid possible and we closed the two portals anteriorly and posteriorly with 4- 0 nylon.  We then applied Ioban and I marked out the incision for the remainder of the surgery laterally of the clavicle and AC joint and then went laterally and distally.  At the [facility] joint which I identified with subperiosteal dissection, I then measured 1.5 cm medial and placed scribe lines on the clavicle at this point.  I then undermined the clavicle with small elevator and placed __it________ protecting underneath tissues and used a micro saw to amputate the clavicle at that point.  In fact there was no residual spurs on the apparent clavicle which I covered with bone wax.  I then continued the incision as planned lateralward with subperiosteal dissection exposing the anterior acromion, placed a Cobb elevator beneath it.  I then used a micro saw to decompress the underlying rotator cuff.  I also made several passes with the saw and also used small rongeur and rasp.  The rotator cuff tear was identified.  It was little more than 1 cm on a size and what was  essentially an equilateral triangle.  I roughened up the articular cartilage above the greater tuberosity and advanced the rotator cuff tear lateralward attaching it using a 4-stranded Stryker 5.5 anchor.  We supplemented this repair with several lateral sutures.  This gave a nice stable repair with his arm to his side.  The wound was then irrigated with sterile saline and closure performed with interrupted #1 Vicryl over the fascia over the distal clavicle and anterior acromion, 2-0 Vicryl subcutaneous tissue and  staples in the skin.  Betadine, Adaptic, dry sterile dressing, shoulder immobilizer applied.  He tolerated the procedure well, was taken to the recovery room in a satisfied condition with no known complications.          ______________________________ Marlowe Kays, M.D.     JA/MEDQ  D:  09/05/2012  T:  09/06/2012  Job:  [id number]

## 2012-09-07 NOTE — Discharge Summary (Signed)
Mario Arnold, Mario Arnold NO.:  [id number]  MEDICAL RECORD NO.:  [id number]  LOCATION:  1609                         FACILITY:  [facility]  PHYSICIAN:  Marlowe Kays, M.D.  DATE OF BIRTH:  01/31/1956  DATE OF ADMISSION:  09/05/2012 DATE OF DISCHARGE:  09/06/2012                              DISCHARGE SUMMARY   ADMITTING DIAGNOSES: 1. Right shoulder labral tear. 2. Acromioclavicular joint arthritis. 3. Rotator cuff tear.  DISCHARGE DIAGNOSES: 1. Right shoulder labral tear. 2. Acromioclavicular joint arthritis. 3. Rotator cuff tear.  OPERATION:  On September 05, 2012: 1. Right shoulder arthroscopy with debridement of labrum. 2. Open distal clavicle resection. 3. Open anterior acromionectomy and repair of torn rotator cuff.  SUMMARY:  This is a 57 year old male who has had progressive pain in his right shoulder with an MRI demonstrating the preoperative diagnoses.  He has had heart issues and also is a type 2 diabetic and did receive medical clearance from his medical doctor and cardiologist prior to surgery.  This was performed without complication with an eventful postoperative course.  He is discharged today comfortable on oral pain medication.  He is afebrile.  He is given prescriptions for Percocet and Robaxin 500.  Also given instructions on using the shoulder immobilizer, bathing, dressing, and undressing.  His wife will change the dressing in 3 days at home, and if there is no unusual drainage, he may be showering at that time.  Return visit to my office in 2 weeks from surgery with instructions to call [phone] for appointment.  CONDITION AT DISCHARGE:  Stable and improved.          ______________________________ Marlowe Kays, M.D.     JA/MEDQ  D:  09/06/2012  T:  09/07/2012  Job:  [id number]

## 2012-09-17 NOTE — Discharge Summary (Signed)
Mario Arnold, Mario Arnold NO.:  [id number]  MEDICAL RECORD NO.:  [id number]  LOCATION:  1609                         FACILITY:  [facility]  PHYSICIAN:  Marlowe Kays, M.D.  DATE OF BIRTH:  [birth year]  DATE OF ADMISSION:  09/05/2012 DATE OF DISCHARGE:  09/06/2012                              DISCHARGE SUMMARY   ADMISSION DIAGNOSES: 1. Right shoulder labral tear. 2. Rotator cuff tear. 3. Acromioclavicular joint arthritis.  DISCHARGE DIAGNOSES: 1. Right shoulder labral tear. 2. Rotator cuff tear. 3. Acromioclavicular joint arthritis.  OPERATION: 1. On September 05, 2012, right shoulder arthroscopy with labral     debridement. 2. Open distal clavicle resection. 3. Anterior acromionectomy with rotator cuff repair.  SUMMARY:  Because of chronic pain in his right shoulder, an MRI was performed demonstrating the admission diagnoses.  Potential risks and complications were discussed with him.  Preoperatively he was cleared by his cardiologist, Dr. Peter Swaziland.  The surgical procedure proceeded uneventfully.  He had an interscalene block under general anesthesia. At discharge, he was afebrile and comfortable on oral pain medication. Wound was healing satisfactorily.  He was given discharge instructions on appropriate use of the shoulder immobilizer, bathing, dressing, and undressing.  He will resume his Aggrenox as of discharge, he was also given appropriate pain medication and muscle relaxants to return to my office 2 weeks from surgery, unless there was a problem, he needs to call [phone] for appointment.  CONDITION ON DISCHARGE:  Stable.          ______________________________ Marlowe Kays, M.D.     JA/MEDQ  D:  09/17/2012  T:  09/17/2012  Job:  [id number]

## 2012-10-02 ENCOUNTER — Ambulatory Visit
Admission: RE | Admit: 2012-10-02 | Discharge: 2012-10-02 | Disposition: A | Discharge status: Home / Self Care | Payer: PRIVATE HEALTH INSURANCE | Source: Ambulatory Visit | Attending: Family Medicine | Admitting: Family Medicine

## 2012-10-02 DIAGNOSIS — E041 Nontoxic single thyroid nodule: Secondary | ICD-10-CM

## 2012-10-29 ENCOUNTER — Other Ambulatory Visit: Payer: Self-pay | Admitting: Nurse Practitioner

## 2012-11-01 ENCOUNTER — Other Ambulatory Visit: Payer: Self-pay | Admitting: Nurse Practitioner

## 2012-11-01 ENCOUNTER — Other Ambulatory Visit: Payer: Self-pay | Admitting: *Deleted

## 2012-11-01 MED ORDER — ISOSORBIDE MONONITRATE ER 60 MG PO TB24
60.0000 mg | ORAL_TABLET | Freq: Every evening | ORAL | Status: DC
Start: 2012-11-01 — End: 2013-04-15

## 2013-04-15 ENCOUNTER — Other Ambulatory Visit: Payer: Self-pay | Admitting: Cardiology

## 2013-04-25 ENCOUNTER — Encounter: Payer: Self-pay | Admitting: Cardiology

## 2013-05-07 ENCOUNTER — Ambulatory Visit (INDEPENDENT_AMBULATORY_CARE_PROVIDER_SITE_OTHER): Payer: PRIVATE HEALTH INSURANCE | Admitting: Nurse Practitioner

## 2013-05-07 ENCOUNTER — Encounter: Payer: Self-pay | Admitting: Nurse Practitioner

## 2013-05-07 VITALS — BP 108/80 | HR 60 | Ht 67.0 in | Wt 195.8 lb

## 2013-05-07 DIAGNOSIS — I779 Disorder of arteries and arterioles, unspecified: Secondary | ICD-10-CM

## 2013-05-07 DIAGNOSIS — I739 Peripheral vascular disease, unspecified: Secondary | ICD-10-CM

## 2013-05-07 DIAGNOSIS — E785 Hyperlipidemia, unspecified: Secondary | ICD-10-CM

## 2013-05-07 DIAGNOSIS — I10 Essential (primary) hypertension: Secondary | ICD-10-CM

## 2013-05-07 DIAGNOSIS — I2510 Atherosclerotic heart disease of native coronary artery without angina pectoris: Secondary | ICD-10-CM

## 2013-05-07 LAB — BASIC METABOLIC PANEL
BUN: 20 mg/dL (ref 6–23)
CO2: 23 mEq/L (ref 19–32)
Calcium: 9.8 mg/dL (ref 8.4–10.5)
Chloride: 107 mEq/L (ref 96–112)
Creatinine, Ser: 1.3 mg/dL (ref 0.4–1.5)
GFR: 59.65 mL/min — ABNORMAL LOW (ref >60.00)
Glucose, Bld: 184 mg/dL — ABNORMAL HIGH (ref 70–99)
Potassium: 4.1 mEq/L (ref 3.5–5.1)
Sodium: 137 mEq/L (ref 135–145)

## 2013-05-07 LAB — HEPATIC FUNCTION PANEL
ALT: 37 U/L (ref 0–53)
AST: 26 U/L (ref 0–37)
Albumin: 4.4 g/dL (ref 3.5–5.2)
Alkaline Phosphatase: 71 U/L (ref 39–117)
Bilirubin, Direct: 0 mg/dL (ref 0.0–0.3)
Total Bilirubin: 0.6 mg/dL (ref 0.3–1.2)
Total Protein: 7.6 g/dL (ref 6.0–8.3)

## 2013-05-07 LAB — CBC
HCT: 44.9 % (ref 39.0–52.0)
Hemoglobin: 14.8 g/dL (ref 13.0–17.0)
MCHC: 33 g/dL (ref 30.0–36.0)
MCV: 87.7 fl (ref 78.0–100.0)
Platelets: 202 10*3/uL (ref 150.0–400.0)
RBC: 5.12 Mil/uL (ref 4.22–5.81)
RDW: 12.7 % (ref 11.5–14.6)
WBC: 6 10*3/uL (ref 4.5–10.5)

## 2013-05-07 LAB — LIPID PANEL
Cholesterol: 115 mg/dL (ref 0–200)
HDL: 29.3 mg/dL — ABNORMAL LOW (ref >39.00)
Total CHOL/HDL Ratio: 4
Triglycerides: 235 mg/dL — ABNORMAL HIGH (ref 0.0–149.0)
VLDL: 47 mg/dL — ABNORMAL HIGH (ref 0.0–40.0)

## 2013-05-07 LAB — LDL CHOLESTEROL, DIRECT: Direct LDL: 55.5 mg/dL

## 2013-05-07 MED ORDER — NITROGLYCERIN 0.4 MG SL SUBL: 0.4000 mg | SUBLINGUAL_TABLET | SUBLINGUAL | Status: DC | PRN

## 2013-05-07 NOTE — Patient Instructions (Addendum)
Stay on your current medicines - I did send in a prescription for NTG to have on hand  We will check labs today  We need to update your carotid duplex  See Dr. SwazilandJordan in 6 months  Call the [facility] office at [phone] if you have any questions, problems or concerns.

## 2013-05-07 NOTE — Progress Notes (Signed)
Kandis CockingBuddy L Hinkson Date of Birth: 07/18/1955 Medical Record #[id number]  History of Present Illness: [name] is seen back today for a follow up visit. Seen for Dr. SwazilandJordan. Has not been here since August of 2013. Has known CAD with past abnormal Myoview and remote PCI of the LAD in 2003. Last cath showed moderate 2 vessel disease with a 50% INR of the proximal LAD and a 70% lesion in an intermediate branch. EF of 50% - managed medically. Other issues include known carotid disease - less than 50% bilaterally when studied in 2013, thyroid nodule with normal TSH, prior TIA in 2013 on aggrenox, DM, HTN and HLD.   Has not been seen since August of 2013 - was doing ok.   Comes back today. Here with his wife. Did not get a recall letter. Has been doing ok. Seems to be at his baseline. No chest pain. Breathing ok. Some fatigue at times. Not really exercising. Tolerating his medicines. Does not know what his A1C is - has not seen his PCP in over a year as well. Overall he feels like he is at his baseline. Has had a shoulder surgery since his last visit here. Also with some "kidney impairment" - no real details noted. Not on ACE.   Current Outpatient Prescriptions  Medication Sig Dispense Refill  . atorvastatin (LIPITOR) 80 MG tablet Take 80 mg by mouth every evening.       . dipyridamole-aspirin (AGGRENOX) 25-200 MG per 12 hr capsule Take 1 capsule by mouth 2 (two) times daily.       [name]. ezetimibe (ZETIA) 10 MG tablet Take 10 mg by mouth every evening.      . fenofibrate 160 MG tablet Take 160 mg by mouth every evening.      . fish oil-omega-3 fatty acids 1000 MG capsule Take 1 g by mouth 2 (two) times daily. States has been instructed date to stop      . glyBURIDE (DIABETA) 5 MG tablet Take 5 mg by mouth daily with breakfast.       . isosorbide mononitrate (IMDUR) 60 MG 24 hr tablet TAKE ONE TABLET BY MOUTH ONCE DAILY IN THE EVENING  30 tablet  2  . metFORMIN (GLUCOPHAGE) 500 MG tablet Take 500 mg by mouth 2  (two) times daily with a meal.       . metoprolol tartrate (LOPRESSOR) 25 MG tablet Take 25 mg by mouth 2 (two) times daily.        No current facility-administered medications for this visit.    Allergies  Allergen Reactions  . Hydromorphone Nausea And Vomiting    Past Medical History  Diagnosis Date  . Coronary artery disease     STATUS POST ANGIOPLASTY OF THE LAD   . Diabetes mellitus     TYPE 2  . Dyslipidemia   . History of transient ischemic attack (TIA)     on aggrenox  . Thyroid nodule June 2013    had US with 4 mm small cystic nodule noted. Needs follow up US in 12 months. TSH is normal.   . Carotid disease, bilateral June 2013    less than 50% stenosis in the right and left   . Arthritis   . Myocardial infarction 1999    stent placement- per patient has 2 stents    Past Surgical History  Procedure Laterality Date  . Coronary angioplasty with stent placement  2003    LAD. EJECTION FRACTION IS 45%  . Shoulder  arthroscopy with open rotator cuff repair and distal clavicle acrominectomy Right 09/05/2012    Procedure: RIGHT SHOULDER ARTHROSCOPY WITH LABRAL DEBRIDEMENT AND OPEN DISTAL CLAVICLE RESECTION, acromionectomy  AND OPEN ROTATOR CUFF REPAIR;  Surgeon: Drucilla Schmidt, MD;  Location: [facility];  Service: Orthopedics;  Laterality: Right;    History  Smoking status  . Former Smoker -- 1.50 packs/day for 23 years  . Types: Cigarettes  . Quit date: 03/28/1997  Smokeless tobacco  . Former Neurosurgeon  . Types: Snuff, Chew    Comment: as a teen    History  Alcohol Use  . Yes    Comment: socially    Family History  Problem Relation Age of Onset  . Coronary artery disease Mother   . Heart attack Mother   . Diabetes Mother   . Transient ischemic attack Mother   . Heart disease Mother   . Cancer Father     Review of Systems: The review of systems is per the HPI.  All other systems were reviewed and are negative.  Physical Exam: BP 108/80  Pulse 60  Ht 5'  7" (1.702 m)  Wt 195 lb 12.8 oz (88.814 kg)  BMI 30.66 kg/m2 Patient is very pleasant and in no acute distress. Skin is warm and dry. Color is normal.  HEENT is unremarkable. Normocephalic/atraumatic. PERRL. Sclera are nonicteric. Neck is supple. No masses. No JVD. Lungs are clear. Cardiac exam shows a regular rate and rhythm. Abdomen is soft. Extremities are without edema. Gait and ROM are intact. No gross neurologic deficits noted.  LABORATORY DATA:  EKG today shows sinus rhythm, possible septal infarct - tracing unchanged.   Lab Results  Component Value Date   WBC 5.1 08/27/2012   HGB 14.5 08/27/2012   HCT 43.1 08/27/2012   PLT 204 08/27/2012   GLUCOSE 166* 08/27/2012   NA 139 08/27/2012   K 4.2 08/27/2012   CL 106 08/27/2012   CREATININE 1.39* 08/27/2012   BUN 17 08/27/2012   CO2 25 08/27/2012   TSH 2.71 09/13/2011   INR 1.0 08/24/2011     Assessment / Plan: 1. CAD - remote PCI of the LAD - known 2 vessel CAD with 50% ISR of the LAD and 70% intermediate lesion - managed medically - he is doing well. I have sent in a RX for NTG to have on hand. Continue with CV risk factor modification. See back in 6 months.   2. HTN - BP looks good.   3. HLD - recheck labs today  4. Carotid disease - recheck his duplex  See him back in 6 months.   Patient is agreeable to this plan and will call if any problems develop in the interim.   Rosalio Macadamia, RN, ANP-C [facility] [address] [place], [place]  [address] [phone]

## 2013-05-17 ENCOUNTER — Other Ambulatory Visit (INDEPENDENT_AMBULATORY_CARE_PROVIDER_SITE_OTHER): Payer: PRIVATE HEALTH INSURANCE

## 2013-05-17 ENCOUNTER — Other Ambulatory Visit: Payer: Self-pay | Admitting: *Deleted

## 2013-05-17 ENCOUNTER — Ambulatory Visit (HOSPITAL_COMMUNITY): Payer: PRIVATE HEALTH INSURANCE | Attending: Nurse Practitioner

## 2013-05-17 DIAGNOSIS — I779 Disorder of arteries and arterioles, unspecified: Secondary | ICD-10-CM

## 2013-05-17 DIAGNOSIS — I658 Occlusion and stenosis of other precerebral arteries: Secondary | ICD-10-CM | POA: Insufficient documentation

## 2013-05-17 DIAGNOSIS — I10 Essential (primary) hypertension: Secondary | ICD-10-CM

## 2013-05-17 DIAGNOSIS — E119 Type 2 diabetes mellitus without complications: Secondary | ICD-10-CM

## 2013-05-17 DIAGNOSIS — I6529 Occlusion and stenosis of unspecified carotid artery: Secondary | ICD-10-CM | POA: Insufficient documentation

## 2013-05-17 DIAGNOSIS — E785 Hyperlipidemia, unspecified: Secondary | ICD-10-CM

## 2013-05-17 DIAGNOSIS — Z87891 Personal history of nicotine dependence: Secondary | ICD-10-CM | POA: Insufficient documentation

## 2013-05-17 DIAGNOSIS — Z8673 Personal history of transient ischemic attack (TIA), and cerebral infarction without residual deficits: Secondary | ICD-10-CM | POA: Insufficient documentation

## 2013-05-17 DIAGNOSIS — I2510 Atherosclerotic heart disease of native coronary artery without angina pectoris: Secondary | ICD-10-CM | POA: Insufficient documentation

## 2013-05-17 DIAGNOSIS — I739 Peripheral vascular disease, unspecified: Secondary | ICD-10-CM

## 2013-05-17 LAB — HEMOGLOBIN A1C: Hgb A1c MFr Bld: 10.8 % — ABNORMAL HIGH (ref 4.6–6.5)

## 2013-05-24 ENCOUNTER — Other Ambulatory Visit: Payer: Self-pay | Admitting: *Deleted

## 2013-05-24 MED ORDER — FENOFIBRATE 160 MG PO TABS: 160.0000 mg | ORAL_TABLET | Freq: Every evening | ORAL | Status: DC

## 2013-06-21 ENCOUNTER — Encounter: Payer: Self-pay | Admitting: Cardiology

## 2013-07-18 ENCOUNTER — Other Ambulatory Visit: Payer: Self-pay

## 2013-07-18 MED ORDER — ISOSORBIDE MONONITRATE ER 60 MG PO TB24: ORAL_TABLET | ORAL | Status: DC

## 2013-10-17 ENCOUNTER — Other Ambulatory Visit: Payer: Self-pay | Admitting: Cardiology

## 2013-10-24 IMAGING — CR DG CHEST 2V
2 series · 2 of 2 positions shown · non-contrast
Comparison: 08/24/2011

CLINICAL DATA: Preop for rotator cuff repair

CHEST - 2 VIEW

[w chest pa]
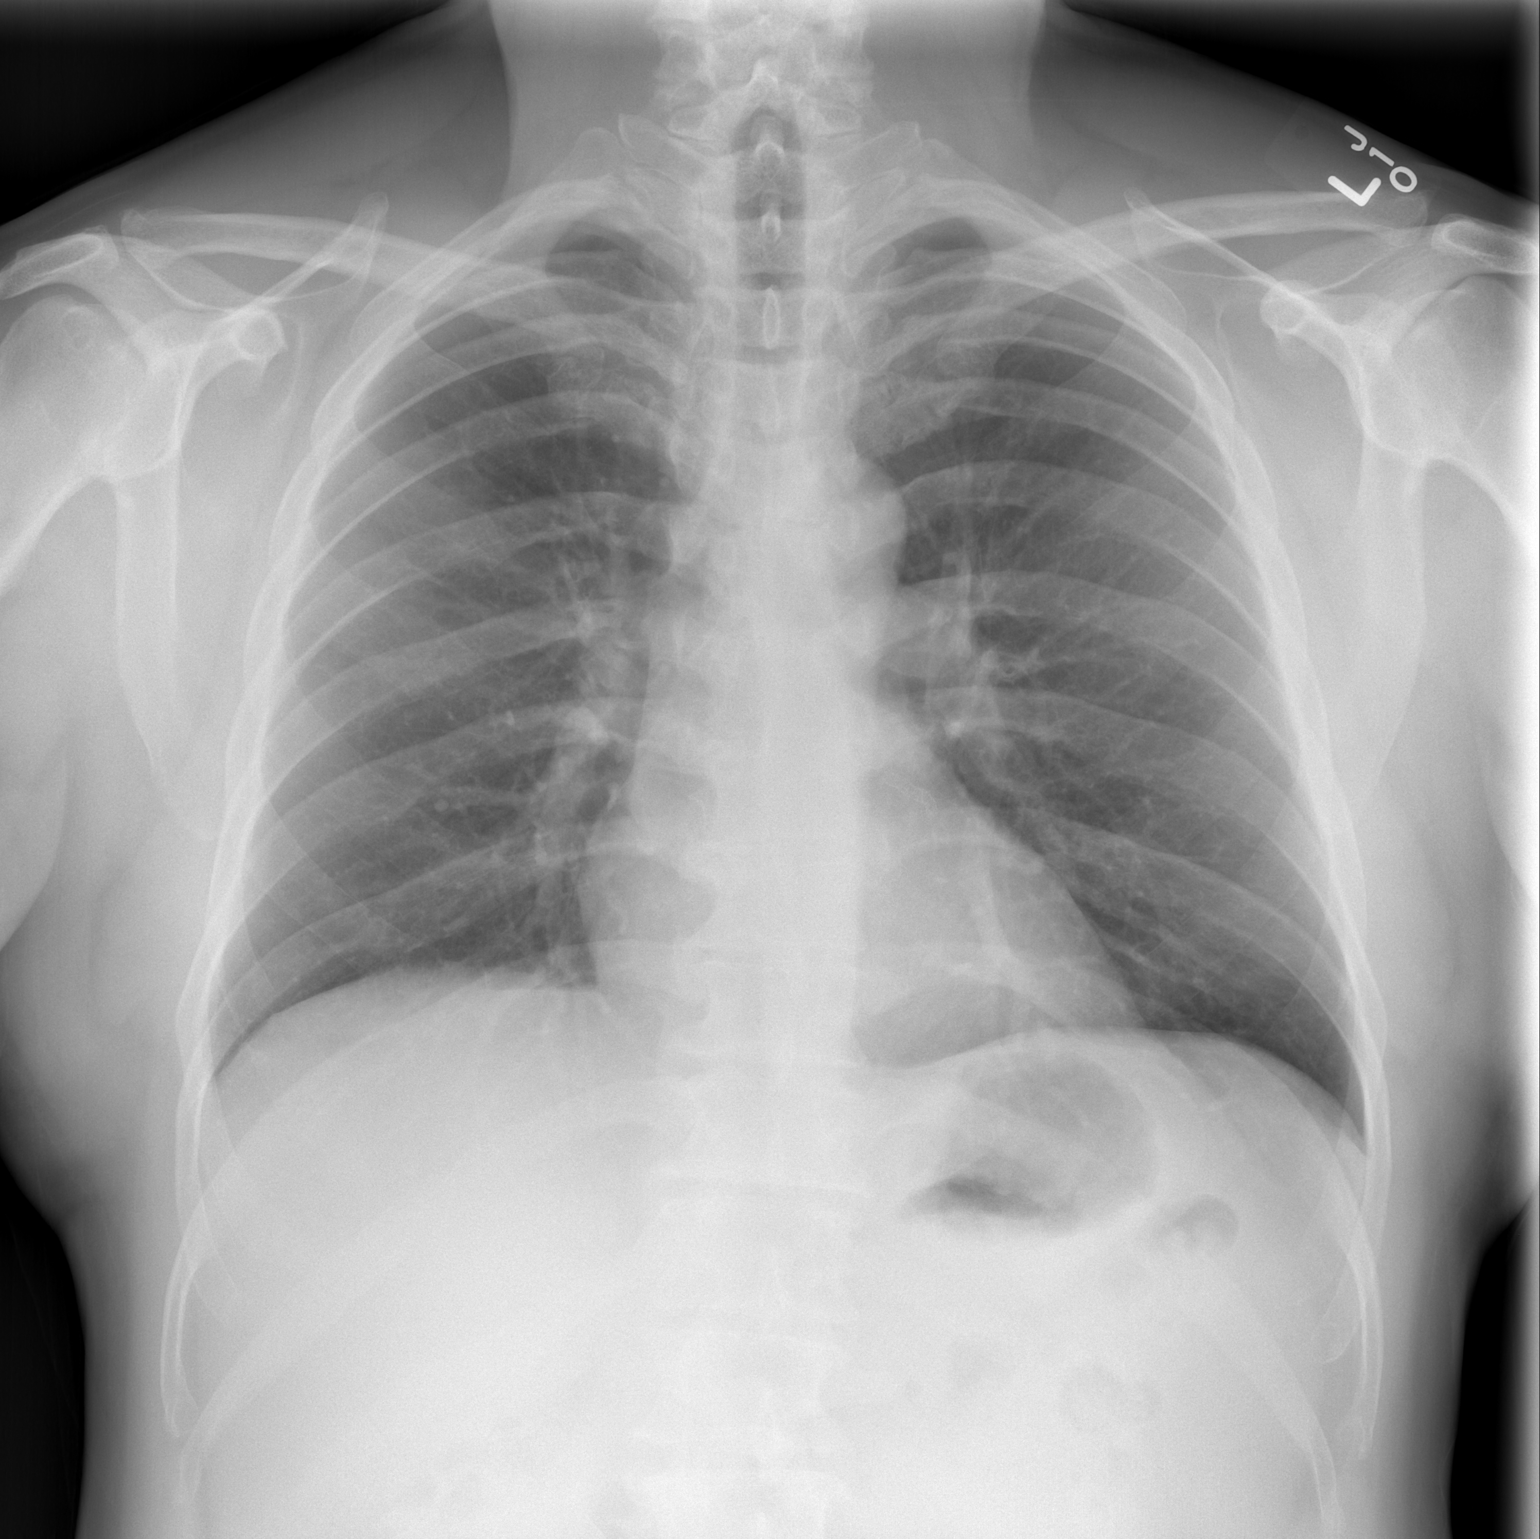

[w chest lat]
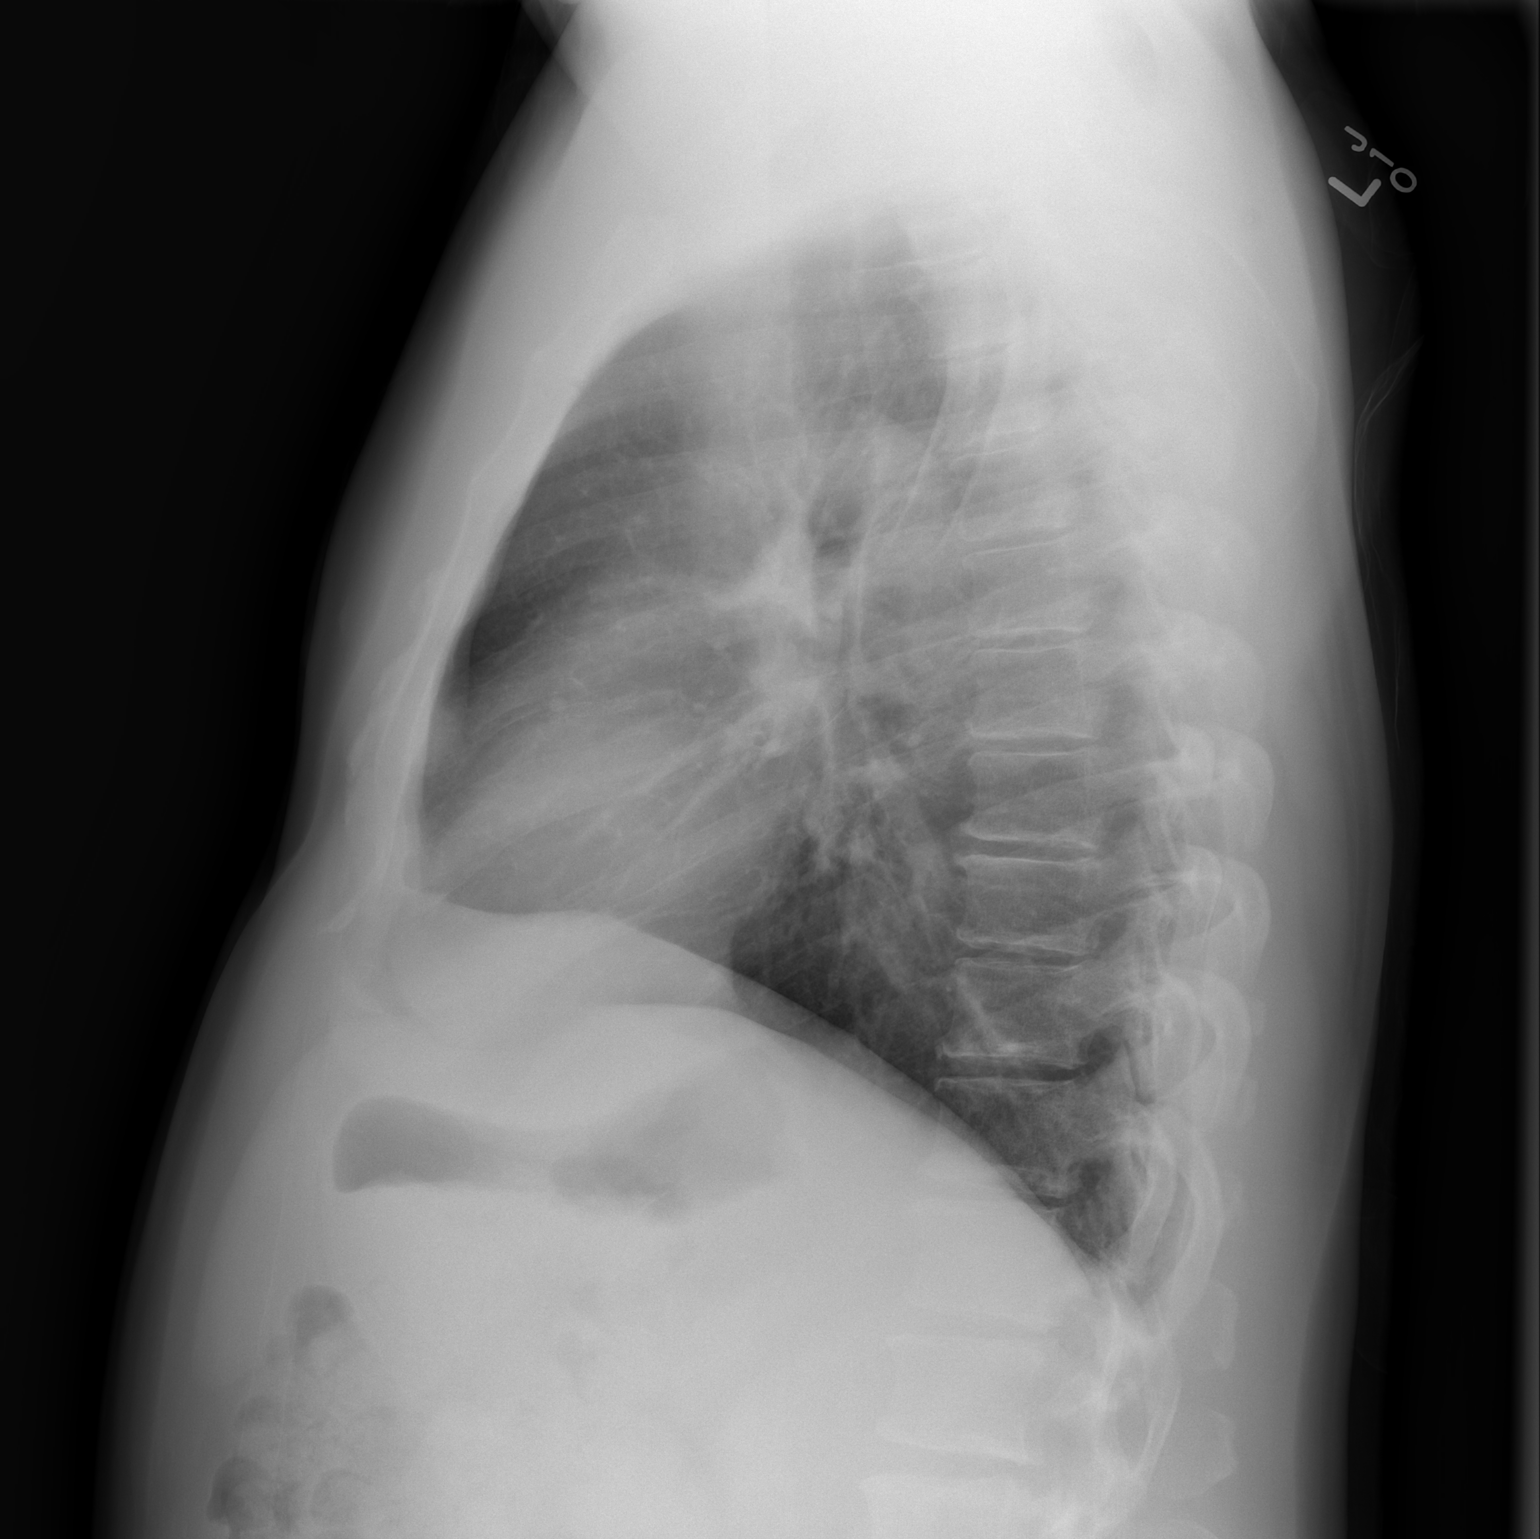

[2 of 2 positions shown; findings below may reference images not displayed]

FINDINGS: Cardiomediastinal silhouette is stable.  There is no
infiltrate or pleural effusion.  No pulmonary edema.  Bony
structures are stable.  Again noted degenerative changes thoracic
spine.
IMPRESSION: No active disease.  No significant change.

## 2013-11-10 ENCOUNTER — Emergency Department (HOSPITAL_COMMUNITY): Payer: PRIVATE HEALTH INSURANCE

## 2013-11-10 ENCOUNTER — Emergency Department (HOSPITAL_COMMUNITY)
Admission: EM | Admit: 2013-11-10 | Discharge: 2013-11-10 | Disposition: A | Discharge status: Home / Self Care | Payer: PRIVATE HEALTH INSURANCE | Attending: Emergency Medicine | Admitting: Emergency Medicine

## 2013-11-10 ENCOUNTER — Emergency Department (INDEPENDENT_AMBULATORY_CARE_PROVIDER_SITE_OTHER)
Admission: EM | Admit: 2013-11-10 | Discharge: 2013-11-10 | Disposition: A | Discharge status: Home / Self Care | Payer: PRIVATE HEALTH INSURANCE | Source: Home / Self Care | Attending: Emergency Medicine | Admitting: Emergency Medicine

## 2013-11-10 ENCOUNTER — Encounter (HOSPITAL_COMMUNITY): Payer: Self-pay | Admitting: Emergency Medicine

## 2013-11-10 DIAGNOSIS — Y9389 Activity, other specified: Secondary | ICD-10-CM | POA: Insufficient documentation

## 2013-11-10 DIAGNOSIS — S335XXA Sprain of ligaments of lumbar spine, initial encounter: Secondary | ICD-10-CM | POA: Insufficient documentation

## 2013-11-10 DIAGNOSIS — Z9861 Coronary angioplasty status: Secondary | ICD-10-CM | POA: Insufficient documentation

## 2013-11-10 DIAGNOSIS — Z7982 Long term (current) use of aspirin: Secondary | ICD-10-CM | POA: Diagnosis not present

## 2013-11-10 DIAGNOSIS — E119 Type 2 diabetes mellitus without complications: Secondary | ICD-10-CM | POA: Diagnosis not present

## 2013-11-10 DIAGNOSIS — Z87891 Personal history of nicotine dependence: Secondary | ICD-10-CM | POA: Diagnosis not present

## 2013-11-10 DIAGNOSIS — IMO0002 Reserved for concepts with insufficient information to code with codable children: Secondary | ICD-10-CM | POA: Diagnosis present

## 2013-11-10 DIAGNOSIS — E785 Hyperlipidemia, unspecified: Secondary | ICD-10-CM | POA: Insufficient documentation

## 2013-11-10 DIAGNOSIS — Y92410 Unspecified street and highway as the place of occurrence of the external cause: Secondary | ICD-10-CM | POA: Insufficient documentation

## 2013-11-10 DIAGNOSIS — I252 Old myocardial infarction: Secondary | ICD-10-CM | POA: Diagnosis not present

## 2013-11-10 DIAGNOSIS — M129 Arthropathy, unspecified: Secondary | ICD-10-CM | POA: Diagnosis not present

## 2013-11-10 DIAGNOSIS — Z8673 Personal history of transient ischemic attack (TIA), and cerebral infarction without residual deficits: Secondary | ICD-10-CM | POA: Diagnosis not present

## 2013-11-10 DIAGNOSIS — Z79899 Other long term (current) drug therapy: Secondary | ICD-10-CM | POA: Diagnosis not present

## 2013-11-10 DIAGNOSIS — I2510 Atherosclerotic heart disease of native coronary artery without angina pectoris: Secondary | ICD-10-CM | POA: Diagnosis not present

## 2013-11-10 DIAGNOSIS — M6281 Muscle weakness (generalized): Secondary | ICD-10-CM

## 2013-11-10 DIAGNOSIS — S39012A Strain of muscle, fascia and tendon of lower back, initial encounter: Secondary | ICD-10-CM

## 2013-11-10 DIAGNOSIS — M549 Dorsalgia, unspecified: Secondary | ICD-10-CM

## 2013-11-10 MED ORDER — CYCLOBENZAPRINE HCL 10 MG PO TABS: 10.0000 mg | ORAL_TABLET | Freq: Two times a day (BID) | ORAL | Status: DC | PRN

## 2013-11-10 MED ORDER — HYDROCODONE-ACETAMINOPHEN 5-325 MG PO TABS: 1.0000 | ORAL_TABLET | Freq: Four times a day (QID) | ORAL | Status: DC | PRN

## 2013-11-10 NOTE — ED Notes (Addendum)
[facility] and states that today his lower back hurts. Patient reports taking tylenol for pain.
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
## 2013-11-10 NOTE — Discharge Instructions (Signed)
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
## 2013-11-11 NOTE — ED Provider Notes (Signed)
Medical screening examination/treatment/procedure(s) were performed by non-physician practitioner and as supervising physician I was immediately available for consultation/collaboration.  Leslee Homeavid Anjeanette Petzold, M.D.  Reuben Likesavid C Keigan Girten, MD 11/11/13 [phone]

## 2013-11-11 NOTE — ED Provider Notes (Signed)
History/physical exam/procedure(s) were performed by non-physician practitioner and as supervising physician I was immediately available for consultation/collaboration. I have reviewed all notes and am in agreement with care and plan.   Landi Biscardi S Berwyn Bigley, MD 11/11/13 1227 

## 2013-11-18 ENCOUNTER — Ambulatory Visit: Payer: PRIVATE HEALTH INSURANCE | Admitting: Cardiology

## 2013-12-11 ENCOUNTER — Other Ambulatory Visit: Payer: Self-pay

## 2013-12-11 DIAGNOSIS — I2510 Atherosclerotic heart disease of native coronary artery without angina pectoris: Secondary | ICD-10-CM

## 2013-12-11 DIAGNOSIS — E785 Hyperlipidemia, unspecified: Secondary | ICD-10-CM

## 2013-12-20 ENCOUNTER — Ambulatory Visit (INDEPENDENT_AMBULATORY_CARE_PROVIDER_SITE_OTHER): Payer: PRIVATE HEALTH INSURANCE | Admitting: Physician Assistant

## 2013-12-20 ENCOUNTER — Encounter: Payer: Self-pay | Admitting: Physician Assistant

## 2013-12-20 VITALS — BP 122/72 | HR 75 | Ht 67.0 in | Wt 190.0 lb

## 2013-12-20 DIAGNOSIS — E785 Hyperlipidemia, unspecified: Secondary | ICD-10-CM

## 2013-12-20 DIAGNOSIS — I2510 Atherosclerotic heart disease of native coronary artery without angina pectoris: Secondary | ICD-10-CM

## 2013-12-20 DIAGNOSIS — I739 Peripheral vascular disease, unspecified: Secondary | ICD-10-CM

## 2013-12-20 DIAGNOSIS — E119 Type 2 diabetes mellitus without complications: Secondary | ICD-10-CM

## 2013-12-20 DIAGNOSIS — I779 Disorder of arteries and arterioles, unspecified: Secondary | ICD-10-CM

## 2013-12-20 NOTE — Patient Instructions (Signed)
1.  I recommend daily exercise.   2.  Try to reduce carbohydrate.   3.  Follow up with Dr. Swaziland in 6 months.   4.  We will get labs from Dr. Clarene Duke.

## 2013-12-20 NOTE — Assessment & Plan Note (Signed)
A1c 10.8 in February 2015. He is on metformin. Recommend low carbohydrate diet which we discussed.

## 2013-12-20 NOTE — Assessment & Plan Note (Addendum)
On Zetia and fenofibrate.  LDL is at target however is vLDL is high.

## 2013-12-20 NOTE — Assessment & Plan Note (Signed)
Carotid Dopplers were 2015 showed a 40-59% right ICA stenosis and 1-39% left ICA stenosis. We'll continue to monitor.

## 2013-12-20 NOTE — Assessment & Plan Note (Signed)
>>  ASSESSMENT AND PLAN FOR DYSLIPIDEMIA WRITTEN ON 12/20/2013  3:34 PM BY HAGER, BRYAN W, PA-C  On Zetia and fenofibrate.  LDL is at target however is vLDL is high.

## 2013-12-20 NOTE — Progress Notes (Signed)
Date:  12/20/2013   ID:  Mario Arnold, DOB [birth year], MRN [id number]  PCP:  Mickie Hillier, MD  Primary Cardiologist:  [place]    History of Present Illness: Mario Arnold is a 58 y.o. male with CAD with past abnormal Myoview and remote PCI of the LAD in 2003. Last cath showed moderate 2 vessel disease with a 50% INR of the proximal LAD and a 70% lesion in an intermediate branch. EF of 50% - managed medically. Other issues include known carotid disease - less than 50% bilaterally when studied in 2013, thyroid nodule with normal TSH, prior TIA in 2013 on aggrenox, DM, HTN and HLD.  He had carotid Dopplers in February 2015 which showed 40-59% right ICA stenosis and 1-39% left ICA stenosis.  His A1c in February was 10.8. Lipid Panel below. Patient presents for a six-month evaluation. He reports feeling a little tired sometimes and has some back pain. He otherwise denies nausea, vomiting, fever, chest pain, shortness of breath, orthopnea, dizziness, PND, cough, congestion, abdominal pain, hematochezia, melena, lower extremity edema, claudication.  He drives 1.5 hours to [place] for work and does not have a lot of extra time for exercise. He eats a lot of potatoes.  He did have some labs done yesterday for Dr.Kevin Little.   we will try to obtain those records.  His weight is down 5 pounds from previous office visit.   Lipid Panel     Component Value Date/Time   CHOL 115 05/07/2013 0855   TRIG 235.0* 05/07/2013 0855   HDL 29.30* 05/07/2013 0855   CHOLHDL 4 05/07/2013 0855   VLDL 47.0* 05/07/2013 0855   LDLDIRECT 55.5 05/07/2013 0855     Wt Readings from Last 3 Encounters:  12/20/13 190 lb (86.183 kg)  05/07/13 195 lb 12.8 oz (88.814 kg)  09/05/12 197 lb (89.359 kg)     Past Medical History  Diagnosis Date  . Coronary artery disease     STATUS POST ANGIOPLASTY OF THE LAD   . Diabetes mellitus     TYPE 2  . Dyslipidemia   . History of transient ischemic attack (TIA)     on  aggrenox  . Thyroid nodule June 2013    had [place] with 4 mm small cystic nodule noted. Needs follow up US in 12 months. TSH is normal.   . Carotid disease, bilateral June 2013    less than 50% stenosis in the right and left   . Arthritis   . Myocardial infarction 1999    stent placement- per patient has 2 stents    Current Outpatient Prescriptions  Medication Sig Dispense Refill  . acetaminophen (TYLENOL) 500 MG tablet Take 1,000 mg by mouth every 6 (six) hours as needed for mild pain.      [name] dipyridamole-aspirin (AGGRENOX) 25-200 MG per 12 hr capsule Take 1 capsule by mouth 2 (two) times daily.       [name] ezetimibe (ZETIA) 10 MG tablet Take 10 mg by mouth every evening.      . fenofibrate 160 MG tablet Take 1 tablet (160 mg total) by mouth every evening.  30 tablet  5  . glyBURIDE (DIABETA) 5 MG tablet Take 5 mg by mouth daily with breakfast.       . isosorbide mononitrate (IMDUR) 60 MG 24 hr tablet TAKE ONE TABLET BY MOUTH IN THE EVENING  30 tablet  6  . metFORMIN (GLUCOPHAGE) 500 MG tablet Take 500 mg by mouth 4 (four) times  daily. Two in am and 2 in pm      . metoprolol tartrate (LOPRESSOR) 25 MG tablet Take 25 mg by mouth 2 (two) times daily.       . nitroGLYCERIN (NITROSTAT) 0.4 MG SL tablet Place 1 tablet (0.4 mg total) under the tongue every 5 (five) minutes as needed.  25 tablet  6   No current facility-administered medications for this visit.    Allergies:    Allergies  Allergen Reactions  . Hydromorphone Nausea And Vomiting    Social History:  The patient  reports that he quit smoking about 16 years ago. His smoking use included Cigarettes. He has a 34.5 pack-year smoking history. He has quit using smokeless tobacco. His smokeless tobacco use included Snuff and Chew. He reports that he drinks alcohol. He reports that he does not use illicit drugs.   Family history:   Family History  Problem Relation Age of Onset  . Coronary artery disease Mother   . Heart attack Mother   .  Diabetes Mother   . Transient ischemic attack Mother   . Heart disease Mother   . Cancer Father     ROS:  Please see the history of present illness.  All other systems reviewed and negative.   PHYSICAL EXAM: VS:  BP 122/72  Pulse 75  Ht  (1.702 m)  Wt 190 lb (86.183 kg)  BMI 29.75 kg/m2 Well nourished, well developed, in no acute distress HEENT: Pupils are equal round react to light accommodation extraocular movements are intact.  Neck: no JVDNo cervical lymphadenopathy. No carotid bruits Cardiac: Regular rate and rhythm without murmurs rubs or gallops. Lungs:  clear to auscultation bilaterally, no wheezing, rhonchi or rales Abd: soft, nontender, positive bowel sounds all quadrants, Ext: no lower extremity edema.  2+ radial and dorsalis pedis pulses. Skin: warm and dry Neuro:  Grossly normal  EKG:  Normal sinus rhythm. Possible atrial enlargement. Septal Q waves. No changes  ASSESSMENT AND PLAN:  Problem List Items Addressed This Visit   Coronary artery disease - Primary     No complaints of angina. Patient does report feeling a little tired more than usual. He does have uncontrolled diabetes and does not exercise at all. 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 He also does not get a full night's sleep. I think all this can contribute to his fatigue.  I recommended a low carbohydrate diet and apparently he eats a lot of french fries/potatoes.    Relevant Orders      EKG 12-Lead   Diabetes mellitus type 2 in nonobese     A1c 10.8 in February 2015. He is on metformin. Recommend low carbohydrate diet which we discussed.    Dyslipidemia     On Zetia and fenofibrate.  LDL is at target however is vLDL is high.    Carotid disease, bilateral     Carotid Dopplers were 2015 showed a 40-59% right ICA stenosis and 1-39% left ICA stenosis. We'll continue to monitor.

## 2013-12-20 NOTE — Assessment & Plan Note (Signed)
No complaints of angina. Patient does report feeling a little tired more than usual. He does have uncontrolled diabetes and does not exercise at all. 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 He also does not get a full night's sleep. I think all this can contribute to his fatigue.  I recommended a low carbohydrate diet and apparently he eats a lot of french fries/potatoes.

## 2014-03-07 ENCOUNTER — Other Ambulatory Visit: Payer: Self-pay | Admitting: *Deleted

## 2014-03-07 MED ORDER — FENOFIBRATE 160 MG PO TABS: 160.0000 mg | ORAL_TABLET | Freq: Every evening | ORAL | Status: DC

## 2014-04-22 ENCOUNTER — Telehealth: Payer: Self-pay | Admitting: Cardiology

## 2014-04-22 NOTE — Telephone Encounter (Signed)
Received records from [facility] (Dr Terrial RhodesJoseph Coladonato) for appointment on 06/20/14 with Dr SwazilandJordan.  [facility] Dr Elvis CoilJordan's schedule on 06/20/14. lp

## 2014-06-06 ENCOUNTER — Other Ambulatory Visit: Payer: Self-pay

## 2014-06-06 MED ORDER — ISOSORBIDE MONONITRATE ER 60 MG PO TB24: 60.0000 mg | ORAL_TABLET | Freq: Every evening | ORAL | Status: DC

## 2014-06-20 ENCOUNTER — Encounter: Payer: Self-pay | Admitting: Cardiology

## 2014-06-20 ENCOUNTER — Ambulatory Visit (INDEPENDENT_AMBULATORY_CARE_PROVIDER_SITE_OTHER): Payer: PRIVATE HEALTH INSURANCE | Admitting: Cardiology

## 2014-06-20 VITALS — BP 102/68 | HR 68 | Ht 67.0 in | Wt 189.5 lb

## 2014-06-20 DIAGNOSIS — I779 Disorder of arteries and arterioles, unspecified: Secondary | ICD-10-CM | POA: Diagnosis not present

## 2014-06-20 DIAGNOSIS — E785 Hyperlipidemia, unspecified: Secondary | ICD-10-CM | POA: Diagnosis not present

## 2014-06-20 DIAGNOSIS — E119 Type 2 diabetes mellitus without complications: Secondary | ICD-10-CM | POA: Diagnosis not present

## 2014-06-20 DIAGNOSIS — I2510 Atherosclerotic heart disease of native coronary artery without angina pectoris: Secondary | ICD-10-CM

## 2014-06-20 DIAGNOSIS — I739 Peripheral vascular disease, unspecified: Secondary | ICD-10-CM

## 2014-06-20 NOTE — Patient Instructions (Signed)
We will schedule you for carotid doppler studies.  Continue your current therapy  I will see you in 6 months

## 2014-06-20 NOTE — Progress Notes (Signed)
Mario Arnold Date of Birth: 08/09/1955 Medical Record #[id number]  History of Present Illness: Mario Arnold is seen for follow up CAD.  Has known CAD with past abnormal Myoview and remote PCI of the LAD in 2003. Last cath in 2013 following abnormal Myoview showed moderate 2 vessel disease with a 50% INR of the proximal LAD and a 70% lesion in an intermediate branch. EF of 50% - managed medically. Other issues include known carotid disease - less than 50% bilaterally when studied in 2013, thyroid nodule with normal TSH, prior TIA in 2013 on aggrenox, DM, HTN and HLD.  On follow up today he is doing well. Denies any chest pain or SOB. His only complaint is that he works long hours and is tired. Recent labs from nephrology noted below.   Current Outpatient Prescriptions  Medication Sig Dispense Refill  . acetaminophen (TYLENOL) 500 MG tablet Take 1,000 mg by mouth every 6 (six) hours as needed for mild pain.    [name]. dipyridamole-aspirin (AGGRENOX) 25-200 MG per 12 hr capsule Take 1 capsule by mouth 2 (two) times daily.     [name]. ezetimibe (ZETIA) 10 MG tablet Take 10 mg by mouth every evening.    . fenofibrate 160 MG tablet Take 1 tablet (160 mg total) by mouth every evening. 30 tablet 3  . glyBURIDE (DIABETA) 5 MG tablet Take 5 mg by mouth daily with breakfast.     . isosorbide mononitrate (IMDUR) 60 MG 24 hr tablet Take 1 tablet (60 mg total) by mouth every evening. 30 tablet 1  . metFORMIN (GLUCOPHAGE) 500 MG tablet Take 500 mg by mouth 4 (four) times daily. Two in am and 2 in pm    . metoprolol tartrate (LOPRESSOR) 25 MG tablet Take 25 mg by mouth 2 (two) times daily.     . nitroGLYCERIN (NITROSTAT) 0.4 MG SL tablet Place 1 tablet (0.4 mg total) under the tongue every 5 (five) minutes as needed. 25 tablet 6  . PROAIR HFA 108 (90 BASE) MCG/ACT inhaler Inhale 2 puffs into the lungs as needed.     No current facility-administered medications for this visit.    Allergies  Allergen Reactions  .  Hydromorphone Nausea And Vomiting    Past Medical History  Diagnosis Date  . Coronary artery disease     STATUS POST ANGIOPLASTY OF THE LAD   . Diabetes mellitus     TYPE 2  . Dyslipidemia   . History of transient ischemic attack (TIA)     on aggrenox  . Thyroid nodule June 2013    had US with 4 mm small cystic nodule noted. Needs follow up US in 12 months. TSH is normal.   . Carotid disease, bilateral June 2013    less than 50% stenosis in the right and left   . Arthritis   . Myocardial infarction 1999    stent placement- per patient has 2 stents    Past Surgical History  Procedure Laterality Date  . Coronary angioplasty with stent placement  2003    LAD. EJECTION FRACTION IS 45%  . Shoulder arthroscopy with open rotator cuff repair and distal clavicle acrominectomy Right 09/05/2012    Procedure: RIGHT SHOULDER ARTHROSCOPY WITH LABRAL DEBRIDEMENT AND OPEN DISTAL CLAVICLE RESECTION, acromionectomy  AND OPEN ROTATOR CUFF REPAIR;  Surgeon: Drucilla SchmidtJames P Aplington, MD;  Location: [facility];  Service: Orthopedics;  Laterality: Right;    History  Smoking status  . Former Smoker -- 1.50 packs/day for 23 years  .  Types: Cigarettes  . Quit date: 03/28/1997  Smokeless tobacco  . Former Neurosurgeon  . Types: Snuff, Chew    Comment: as a teen    History  Alcohol Use  . Yes    Comment: socially    Family History  Problem Relation Age of Onset  . Coronary artery disease Mother   . Heart attack Mother   . Diabetes Mother   . Transient ischemic attack Mother   . Heart disease Mother   . Cancer Father     Review of Systems: The review of systems is per the HPI.  All other systems were reviewed and are negative.  Physical Exam: BP 102/68 mmHg  Pulse 68  Ht  (1.702 m)  Wt 189 lb 8 oz (85.957 kg)  BMI 29.67 kg/m2 Patient is very pleasant and in no acute distress. Skin is warm and dry. Color is normal.  HEENT is unremarkable. Normocephalic/atraumatic. PERRL. Sclera are nonicteric.  Neck is supple. No masses. No JVD. Lungs are clear. Cardiac exam shows a regular rate and rhythm. Abdomen is soft. Extremities are without edema. Gait and ROM are intact. No gross neurologic deficits noted.  LABORATORY DATA:    Lab Results  Component Value Date   WBC 6.0 05/07/2013   HGB 14.8 05/07/2013   HCT 44.9 05/07/2013   PLT 202.0 05/07/2013   GLUCOSE 184* 05/07/2013   CHOL 115 05/07/2013   TRIG 235.0* 05/07/2013   HDL 29.30* 05/07/2013   LDLDIRECT 55.5 05/07/2013   ALT 37 05/07/2013   AST 26 05/07/2013   NA 137 05/07/2013   K 4.1 05/07/2013   CL 107 05/07/2013   CREATININE 1.3 05/07/2013   BUN 20 05/07/2013   CO2 23 05/07/2013   TSH 2.71 09/13/2011   INR 1.0 08/24/2011   HGBA1C 10.8* 05/17/2013   Labs from 04/11/14: glucose 102, BUN 24, creatinine 1.33. Other chemistries normal. A1c 7.5%. Cholesterol 113, trig-100, HDL 28, LDL 65. CBC is normal.  Assessment / Plan: 1. CAD - remote PCI of the LAD - known 2 vessel CAD with 50% ISR of the LAD and 70% intermediate lesion in 2013 - managed medically - he is doing well.  Continue with CV risk factor modification. See back in 6 months.   2. HTN - BP well controlled.   3. HLD - excellent control on Zetia and fenofibrate.   4. Carotid disease - recheck his duplex this month.  See him back in 6 months.

## 2014-06-27 ENCOUNTER — Ambulatory Visit (HOSPITAL_COMMUNITY)
Admission: RE | Admit: 2014-06-27 | Discharge: 2014-06-27 | Disposition: A | Discharge status: Home / Self Care | Payer: PRIVATE HEALTH INSURANCE | Source: Ambulatory Visit | Attending: Cardiovascular Disease | Admitting: Cardiovascular Disease

## 2014-06-27 DIAGNOSIS — E119 Type 2 diabetes mellitus without complications: Secondary | ICD-10-CM | POA: Diagnosis not present

## 2014-06-27 DIAGNOSIS — I2510 Atherosclerotic heart disease of native coronary artery without angina pectoris: Secondary | ICD-10-CM | POA: Insufficient documentation

## 2014-06-27 DIAGNOSIS — I779 Disorder of arteries and arterioles, unspecified: Secondary | ICD-10-CM | POA: Diagnosis not present

## 2014-06-27 DIAGNOSIS — I739 Peripheral vascular disease, unspecified: Secondary | ICD-10-CM

## 2014-06-27 DIAGNOSIS — E785 Hyperlipidemia, unspecified: Secondary | ICD-10-CM | POA: Insufficient documentation

## 2014-06-27 NOTE — Progress Notes (Signed)
Carotid duplex completed. °Brianna L Mazza,RVT °

## 2014-07-03 ENCOUNTER — Telehealth: Payer: Self-pay | Admitting: Cardiology

## 2014-07-03 NOTE — Telephone Encounter (Signed)
Returned call to patient carotid doppler results given. 

## 2014-07-03 NOTE — Telephone Encounter (Signed)
Returning your call. °

## 2014-08-13 ENCOUNTER — Other Ambulatory Visit: Payer: Self-pay | Admitting: *Deleted

## 2014-08-13 MED ORDER — ISOSORBIDE MONONITRATE ER 60 MG PO TB24: 60.0000 mg | ORAL_TABLET | Freq: Every evening | ORAL | Status: DC

## 2014-10-07 ENCOUNTER — Other Ambulatory Visit: Payer: Self-pay

## 2014-10-07 MED ORDER — FENOFIBRATE 160 MG PO TABS: 160.0000 mg | ORAL_TABLET | Freq: Every evening | ORAL | Status: DC

## 2014-12-26 ENCOUNTER — Encounter: Payer: Self-pay | Admitting: Cardiology

## 2014-12-26 ENCOUNTER — Ambulatory Visit (INDEPENDENT_AMBULATORY_CARE_PROVIDER_SITE_OTHER): Payer: PRIVATE HEALTH INSURANCE | Admitting: Cardiology

## 2014-12-26 VITALS — BP 134/76 | HR 60 | Ht 67.0 in | Wt 192.4 lb

## 2014-12-26 DIAGNOSIS — I2510 Atherosclerotic heart disease of native coronary artery without angina pectoris: Secondary | ICD-10-CM

## 2014-12-26 DIAGNOSIS — E119 Type 2 diabetes mellitus without complications: Secondary | ICD-10-CM | POA: Diagnosis not present

## 2014-12-26 DIAGNOSIS — E785 Hyperlipidemia, unspecified: Secondary | ICD-10-CM

## 2014-12-26 NOTE — Patient Instructions (Signed)
Continue your current therapy   I will see you in 6-8 months.  We will get a copy of your lab work from Dr. Clarene Duke.

## 2014-12-26 NOTE — Progress Notes (Signed)
[name] Date of Birth: 1955/06/27 Medical Record #[id number]  History of Present Illness: Mr. Atkison is seen for follow up CAD.  Has known CAD with past abnormal Myoview and remote PCI of the LAD in 2003. Last cath in 2013 following abnormal Myoview showed moderate 2 vessel disease with a 50% INR of the proximal LAD and a 70% lesion in an intermediate branch. EF of 50% - managed medically. Other issues include  thyroid nodule with normal TSH, prior TIA in 2013 on aggrenox, DM, HTN and HLD.  On follow up today he is doing well. Denies any chest pain. His only complaint is that he works long hours and is tired. He does note that sometimes he has to take a deep breath.  Reports labs done by primary care.  Current Outpatient Prescriptions  Medication Sig Dispense Refill  . acetaminophen (TYLENOL) 500 MG tablet Take 1,000 mg by mouth as needed for mild pain.     [name] atorvastatin (LIPITOR) 80 MG tablet Take 80 mg by mouth daily.    [name] dipyridamole-aspirin (AGGRENOX) 25-200 MG per 12 hr capsule Take 1 capsule by mouth 2 (two) times daily.     [name] ezetimibe (ZETIA) 10 MG tablet Take 10 mg by mouth every evening.    . fenofibrate 160 MG tablet Take 1 tablet (160 mg total) by mouth every evening. 30 tablet 2  . glyBURIDE (DIABETA) 5 MG tablet Take 5 mg by mouth daily with breakfast.     . isosorbide mononitrate (IMDUR) 60 MG 24 hr tablet Take 1 tablet (60 mg total) by mouth every evening. 30 tablet 11  . metFORMIN (GLUCOPHAGE) 500 MG tablet Take 500 mg by mouth 4 (four) times daily. Two in am and 2 in pm    . metoprolol tartrate (LOPRESSOR) 25 MG tablet Take 25 mg by mouth 2 (two) times daily.     . nitroGLYCERIN (NITROSTAT) 0.4 MG SL tablet Place 1 tablet (0.4 mg total) under the tongue every 5 (five) minutes as needed. 25 tablet 6   No current facility-administered medications for this visit.    Allergies  Allergen Reactions  . Hydromorphone Nausea And Vomiting    Past Medical History    Diagnosis Date  . Coronary artery disease     STATUS POST ANGIOPLASTY OF THE LAD   . Diabetes mellitus     TYPE 2  . Dyslipidemia   . History of transient ischemic attack (TIA)     on aggrenox  . Thyroid nodule June 2013    had [place] with 4 mm small cystic nodule noted. Needs follow up US in 12 months. TSH is normal.   . Carotid disease, bilateral June 2013    less than 50% stenosis in the right and left   . Arthritis   . Myocardial infarction 1999    stent placement- per patient has 2 stents    Past Surgical History  Procedure Laterality Date  . Coronary angioplasty with stent placement  2003    LAD. EJECTION FRACTION IS 45%  . Shoulder arthroscopy with open rotator cuff repair and distal clavicle acrominectomy Right 09/05/2012    Procedure: RIGHT SHOULDER ARTHROSCOPY WITH LABRAL DEBRIDEMENT AND OPEN DISTAL CLAVICLE RESECTION, acromionectomy  AND OPEN ROTATOR CUFF REPAIR;  Surgeon: Drucilla Schmidt, MD;  Location: [facility];  Service: Orthopedics;  Laterality: Right;    History  Smoking status  . Former Smoker -- 1.50 packs/day for 23 years  . Types: Cigarettes  . Quit date:  03/28/1997  Smokeless tobacco  . Former Neurosurgeon  . Types: Snuff, Chew    Comment: as a teen    History  Alcohol Use  . Yes    Comment: socially    Family History  Problem Relation Age of Onset  . Coronary artery disease Mother   . Heart attack Mother   . Diabetes Mother   . Transient ischemic attack Mother   . Heart disease Mother   . Cancer Father     Review of Systems: The review of systems is per the HPI.  All other systems were reviewed and are negative.  Physical Exam: BP 134/76 mmHg  Pulse 60  Ht  (1.702 m)  Wt 87.272 kg (192 lb 6.4 oz)  BMI 30.13 kg/m2 Patient is very pleasant and in no acute distress. Skin is warm and dry. Color is normal.  HEENT is unremarkable. Normocephalic/atraumatic. PERRL. Sclera are nonicteric. Neck is supple. No masses. No JVD. Lungs are clear. Cardiac  exam shows a regular rate and rhythm. Abdomen is soft. Extremities are without edema. Gait and ROM are intact. No gross neurologic deficits noted.  LABORATORY DATA:    Lab Results  Component Value Date   WBC 6.0 05/07/2013   HGB 14.8 05/07/2013   HCT 44.9 05/07/2013   PLT 202.0 05/07/2013   GLUCOSE 184* 05/07/2013   CHOL 115 05/07/2013   TRIG 235.0* 05/07/2013   HDL 29.30* 05/07/2013   LDLDIRECT 55.5 05/07/2013   ALT 37 05/07/2013   AST 26 05/07/2013   NA 137 05/07/2013   K 4.1 05/07/2013   CL 107 05/07/2013   CREATININE 1.3 05/07/2013   BUN 20 05/07/2013   CO2 23 05/07/2013   TSH 2.71 09/13/2011   INR 1.0 08/24/2011   HGBA1C 10.8* 05/17/2013   Carotid dopplers 06/27/14 showed no significant obstruction.  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 No acute change. I have personally reviewed and interpreted this study.   Assessment / Plan: 1. CAD - remote PCI of the LAD - known 2 vessel CAD with 50% ISR of the LAD and 70% intermediate lesion in 2013 - managed medically - he is asymptomatic.  Continue with CV risk factor modification. See back in 6-8  months.   2. HTN - BP well controlled.   3. HLD - excellent control on Zetia and fenofibrate.   4. DM type 2. Reports blood sugars in 120s. Weight is up 2-3 lbs. Continue to work on diet. Request copy of last lab work with Dr. Clarene Duke.

## 2015-01-07 ENCOUNTER — Encounter: Payer: Self-pay | Admitting: Cardiology

## 2015-02-26 ENCOUNTER — Other Ambulatory Visit: Payer: Self-pay | Admitting: Cardiology

## 2015-02-26 NOTE — Telephone Encounter (Signed)
REFILL 

## 2015-03-29 DIAGNOSIS — N189 Chronic kidney disease, unspecified: Secondary | ICD-10-CM

## 2015-03-29 HISTORY — DX: Chronic kidney disease, unspecified: N18.9

## 2015-04-03 ENCOUNTER — Other Ambulatory Visit: Payer: Self-pay | Admitting: Family Medicine

## 2015-04-03 DIAGNOSIS — N189 Chronic kidney disease, unspecified: Secondary | ICD-10-CM

## 2015-04-15 ENCOUNTER — Ambulatory Visit
Admission: RE | Admit: 2015-04-15 | Discharge: 2015-04-15 | Disposition: A | Discharge status: Home / Self Care | Payer: 59 | Source: Ambulatory Visit | Attending: Family Medicine | Admitting: Family Medicine

## 2015-04-15 ENCOUNTER — Ambulatory Visit
Admission: RE | Admit: 2015-04-15 | Discharge: 2015-04-15 | Disposition: A | Discharge status: Home / Self Care | Payer: PRIVATE HEALTH INSURANCE | Source: Ambulatory Visit | Attending: Family Medicine | Admitting: Family Medicine

## 2015-04-15 DIAGNOSIS — N189 Chronic kidney disease, unspecified: Secondary | ICD-10-CM

## 2015-06-19 ENCOUNTER — Other Ambulatory Visit: Payer: Self-pay | Admitting: Urology

## 2015-06-24 ENCOUNTER — Encounter (HOSPITAL_COMMUNITY): Payer: Self-pay | Admitting: *Deleted

## 2015-06-24 NOTE — H&P (Signed)
Chief Complaint Chief Complaints  1. Flank Pain  Flank pain   History of Present Illness Mr Mario Arnold is a 60yo seen in consultation for ureteral/renal calculus. He is referred by Dr. Catha GosselinKevin Little.  This is his second stone event. Last stone event was over 10 years ago treated with ESWL. He denies any pain currently. He denies any LUTS except for nocturia 1-2x He underwent renal US which showed a 1.8cm UPJ calculus with mild hydro.   CT scan shows 1cm ureteral calculus int he proximal to mid ureter.   Past Medical History Problems  1. History of diabetes mellitus (Z86.39) 2. History of hyperlipidemia (Z86.39)  Surgical History Problems  1. History of Cardiac Cath Procedure Summary 2. History of Lithotripsy - Whole Body (Extracorporeal Shock Wave) 3. History of Shoulder Surgery Right  Current Meds 1. Aggrenox 25-200 MG Oral Capsule Extended Release 12 Hour;  Therapy: (Recorded:24Mar2017) to Recorded 2. Atorvastatin Calcium 80 MG Oral Tablet;  Therapy: (Recorded:24Mar2017) to Recorded 3. Fenofibrate 160 MG Oral Tablet;  Therapy: (Recorded:24Mar2017) to Recorded 4. Glucophage 1000 MG Oral Tablet;  Therapy: (Recorded:24Mar2017) to Recorded 5. GlyBURIDE TABS;  Therapy: (Recorded:24Mar2017) to Recorded 6. Isosorbide Mononitrate ER 60 MG Oral Tablet Extended Release 24 Hour;  Therapy: (Recorded:24Mar2017) to Recorded 7. Metoprolol Tartrate 25 MG Oral Tablet;  Therapy: (Recorded:24Mar2017) to Recorded 8. Zetia 10 MG Oral Tablet;  Therapy: (Recorded:24Mar2017) to Recorded  Allergies Medication  1. Dilaudid TABS 2. NSAIDs  Family History Problems  1. Family history of Death of family member : Mother 2. Family history of cardiac disorder (Z82.49) : Mother 3. Family history of diabetes mellitus (Z83.3) : Mother, Brother  Social History Problems    Alcohol use (Z78.9)   Caffeine use (F15.90)   Former smoker [phone](Z87.891)   Married   Number of children    Occupation  Review of Systems Genitourinary, constitutional, skin, eye, otolaryngeal, hematologic/lymphatic, cardiovascular, pulmonary, endocrine, musculoskeletal, gastrointestinal, neurological and psychiatric system(s) were reviewed and pertinent findings if present are noted and are otherwise negative.  Genitourinary: nocturia.    Vitals Vital Signs [Data Includes: Last 1 Day]  Recorded: 24Mar2017 11:04AM  Height: 5 ft 7 in Weight: 193 lb  BMI Calculated: 30.23 BSA Calculated: 1.99 Blood Pressure: 132 / 72 Temperature: 97.7 F Heart Rate: 62  Physical Exam Constitutional: Well nourished and well developed.  ENT:. The ears and nose are normal in appearance. The oropharynx is normal.  Neck: The appearance of the neck is normal and no neck mass is present.  Pulmonary: No respiratory distress and normal respiratory rhythm and effort.  Cardiovascular:. The arterial pulses are normal. No peripheral edema.  Abdomen: The abdomen is rounded. The abdomen is soft and nontender. No masses are palpated. No CVA tenderness. No hernias are palpable. No hepatosplenomegaly noted.  Lymphatics: The posterior cervical and anterior cervical nodes are not enlarged or tender.  Skin: Normal skin turgor, no visible rash and normal skin color and pigmentation.  Neuro/Psych:. Mood and affect are appropriate. No focal sensory deficits.    Assessment Assessed  1. Nephrolithiasis (N20.0) 2. Ureteral stone (N20.1)  Plan Health Maintenance  1. UA With REFLEX; [Do Not Release]; Status:Resulted - Requires Verification;   Done:  24Mar2017 09:46AM Nephrolithiasis  2. Start: Tamsulosin HCl - 0.4 MG Oral Capsule (Flomax); TAKE 1 CAPSULE Daily 3. AU CT-STONE PROTOCOL; Status:Resulted - Requires Verification;   Done: 24Mar2017  12:00AM  1. schedule for L ESWL   Signatures CC: Dr. Catha GosselinKevin Little  Electronically signed by : Wilkie AyePatrick McKenzie,  M.D.; Jun 22 2015  3:25PM EST Environmental education officer)    Add: U/a no bacteria. Left  proximal 10 mm stone and a 5 mm stone proximal to that one. Visible on scout at L4.

## 2015-06-25 ENCOUNTER — Ambulatory Visit (HOSPITAL_COMMUNITY): Payer: Managed Care, Other (non HMO)

## 2015-06-25 ENCOUNTER — Encounter (HOSPITAL_COMMUNITY)
Admission: RE | Disposition: A | Discharge status: Home / Self Care | Payer: Self-pay | Source: Ambulatory Visit | Attending: Urology

## 2015-06-25 ENCOUNTER — Encounter (HOSPITAL_COMMUNITY): Payer: Self-pay | Admitting: *Deleted

## 2015-06-25 ENCOUNTER — Ambulatory Visit (HOSPITAL_COMMUNITY)
Admission: RE | Admit: 2015-06-25 | Discharge: 2015-06-25 | Disposition: A | Discharge status: Home / Self Care | Payer: Managed Care, Other (non HMO) | Source: Ambulatory Visit | Attending: Urology | Admitting: Urology

## 2015-06-25 DIAGNOSIS — Z8673 Personal history of transient ischemic attack (TIA), and cerebral infarction without residual deficits: Secondary | ICD-10-CM | POA: Insufficient documentation

## 2015-06-25 DIAGNOSIS — Z79899 Other long term (current) drug therapy: Secondary | ICD-10-CM | POA: Diagnosis not present

## 2015-06-25 DIAGNOSIS — I252 Old myocardial infarction: Secondary | ICD-10-CM | POA: Diagnosis not present

## 2015-06-25 DIAGNOSIS — N132 Hydronephrosis with renal and ureteral calculous obstruction: Secondary | ICD-10-CM | POA: Diagnosis present

## 2015-06-25 DIAGNOSIS — E785 Hyperlipidemia, unspecified: Secondary | ICD-10-CM | POA: Insufficient documentation

## 2015-06-25 DIAGNOSIS — N201 Calculus of ureter: Secondary | ICD-10-CM

## 2015-06-25 DIAGNOSIS — Z87891 Personal history of nicotine dependence: Secondary | ICD-10-CM | POA: Diagnosis not present

## 2015-06-25 DIAGNOSIS — Z7902 Long term (current) use of antithrombotics/antiplatelets: Secondary | ICD-10-CM | POA: Insufficient documentation

## 2015-06-25 DIAGNOSIS — E119 Type 2 diabetes mellitus without complications: Secondary | ICD-10-CM | POA: Insufficient documentation

## 2015-06-25 DIAGNOSIS — Z791 Long term (current) use of non-steroidal anti-inflammatories (NSAID): Secondary | ICD-10-CM | POA: Diagnosis not present

## 2015-06-25 HISTORY — DX: Chronic kidney disease, unspecified: N18.9

## 2015-06-25 LAB — GLUCOSE, CAPILLARY: Glucose-Capillary: 124 mg/dL — ABNORMAL HIGH (ref 65–99)

## 2015-06-25 SURGERY — LITHOTRIPSY, ESWL
Anesthesia: LOCAL | Laterality: Left

## 2015-06-25 MED ORDER — DIPHENHYDRAMINE HCL 25 MG PO CAPS
25.0000 mg | ORAL_CAPSULE | ORAL | Status: AC
  Administered 2015-06-25: 25 mg via ORAL
  Filled 2015-06-25: qty 1

## 2015-06-25 MED ORDER — DIAZEPAM 5 MG PO TABS
10.0000 mg | ORAL_TABLET | ORAL | Status: AC
  Administered 2015-06-25: 10 mg via ORAL
  Filled 2015-06-25: qty 2

## 2015-06-25 MED ORDER — CIPROFLOXACIN HCL 500 MG PO TABS
500.0000 mg | ORAL_TABLET | ORAL | Status: AC
  Administered 2015-06-25: 500 mg via ORAL
  Filled 2015-06-25: qty 1

## 2015-06-25 MED ORDER — TAMSULOSIN HCL 0.4 MG PO CAPS: 0.4000 mg | ORAL_CAPSULE | Freq: Every day | ORAL | Status: DC

## 2015-06-25 MED ORDER — SODIUM CHLORIDE 0.9 % IV SOLN
INTRAVENOUS | Status: DC
  Administered 2015-06-25: 08:00:00 via INTRAVENOUS

## 2015-06-25 MED ORDER — TRAMADOL HCL 50 MG PO TABS: 100.0000 mg | ORAL_TABLET | Freq: Four times a day (QID) | ORAL | Status: DC | PRN

## 2015-06-25 MED ORDER — ASPIRIN-DIPYRIDAMOLE ER 25-200 MG PO CP12: 1.0000 | ORAL_CAPSULE | Freq: Two times a day (BID) | ORAL | Status: DC

## 2015-06-25 NOTE — Op Note (Signed)
Left ESWL - 10 mm left proximal stone - Staged  Findings: Stone fragmented/faded well - did not see/treat 5 mm more proximal stone.

## 2015-06-25 NOTE — Interval H&P Note (Signed)
History and Physical Interval Note:  06/25/2015 9:17 AM  Mario Arnold  has presented today for surgery, with the diagnosis of LEFT URETERAL CALCULUS  The various methods of treatment have been discussed with the patient and family. After consideration of risks, benefits and other options for treatment, the patient has consented to  Procedure(s): LEFT EXTRACORPOREAL SHOCK WAVE LITHOTRIPSY (ESWL) (Left) as a surgical intervention.   The patient's history has been reviewed, patient examined, no change in status, stable for surgery.  I have reviewed the patient's chart and labs. I discussed with the patient the nature, potential benefits, risks and alternatives to left ESWL, including side effects of the proposed treatment, the likelihood of the patient achieving the goals of the procedure, and any potential problems that might occur during the procedure or recuperation. All questions answered. Patient elects to proceed.      Mayjor Ager

## 2015-06-25 NOTE — Discharge Instructions (Signed)
° ° ° ° ° ° ° ° °Moderate Conscious Sedation, Adult °Sedation is the use of medicines to promote relaxation and relieve discomfort and anxiety. Moderate conscious sedation is a type of sedation. Under moderate conscious sedation you are less alert than normal but are still able to respond to instructions or stimulation. Moderate conscious sedation is used during short medical and dental procedures. It is milder than deep sedation or general anesthesia and allows you to return to your regular activities sooner. °LET YOUR HEALTH CARE PROVIDER KNOW ABOUT:  °· Any allergies you have. °· All medicines you are taking, including vitamins, herbs, eye drops, creams, and over-the-counter medicines. °· Use of steroids (by mouth or creams). °· Previous problems you or members of your family have had with the use of anesthetics. °· Any blood disorders you have. °· Previous surgeries you have had. °· Medical conditions you have. °· Possibility of pregnancy, if this applies. °· Use of cigarettes, alcohol, or illegal drugs. °RISKS AND COMPLICATIONS °Generally, this is a safe procedure. However, as with any procedure, problems can occur. Possible problems include: °· Oversedation. °· Trouble breathing on your own. You may need to have a breathing tube until you are awake and breathing on your own. °· Allergic reaction to any of the medicines used for the procedure. °BEFORE THE PROCEDURE °· You may have blood tests done. These tests can help show how well your kidneys and liver are working. They can also show how well your blood clots. °· A physical exam will be done.   °· Only take medicines as directed by your health care provider. You may need to stop taking medicines (such as blood thinners, aspirin, or nonsteroidal anti-inflammatory drugs) before the procedure.   °· Do not eat or drink at least 6 hours before the procedure or as directed by your health care provider. °· Arrange for a responsible adult, family member, or friend  to take you home after the procedure. He or she should stay with you for at least 24 hours after the procedure, until the medicine has worn off. °PROCEDURE  °· An intravenous (IV) catheter will be inserted into one of your veins. Medicine will be able to flow directly into your body through this catheter. You may be given medicine through this tube to help prevent pain and help you relax. °· The medical or dental procedure will be done. °AFTER THE PROCEDURE °· You will stay in a recovery area until the medicine has worn off. Your blood pressure and pulse will be checked.   °·  Depending on the procedure you had, you may be allowed to go home when you can tolerate liquids and your pain is under control. °  °This information is not intended to replace advice given to you by your health care provider. Make sure you discuss any questions you have with your health care provider. °  °Document Released: 12/07/2000 Document Revised: 04/04/2014 Document Reviewed: 11/19/2012 °Elsevier Interactive Patient Education ©2016 [employer] ° ° ° ° ° °Lithotripsy, Care After °Refer to this sheet in the next few weeks. These instructions provide you with information on caring for yourself after your procedure. Your health care provider may also give you more specific instructions. Your treatment has been planned according to current medical practices, but problems sometimes occur. Call your health care provider if you have any problems or questions after your procedure. °WHAT TO EXPECT AFTER THE PROCEDURE  °· Your urine may have a red tinge for a few days after treatment. Blood   loss is usually minimal. °· You may have soreness in the back or flank area. This usually goes away after a few days. The procedure can cause blotches or bruises on the back where the pressure wave enters the skin. These marks usually cause only minimal discomfort and should disappear in a short time. °· Stone fragments should begin to pass within 24 hours of  treatment. However, a delayed passage is not unusual. °· You may have pain, discomfort, and feel sick to your stomach (nauseated) when the crushed fragments of stone are passed down the tube from the kidney to the bladder. Stone fragments can pass soon after the procedure and may last for up to 4-8 weeks. °· A small number of patients may have severe pain when stone fragments are not able to pass, which leads to an obstruction. °· If your stone is greater than 1 inch (2.5 cm) in diameter or if you have multiple stones that have a combined diameter greater than 1 inch (2.5 cm), you may require more than one treatment. °· If you had a stent placed prior to your procedure, you may experience some discomfort, especially during urination. You may experience the pain or discomfort in your flank or back, or you may experience a sharp pain or discomfort at the base of your penis or in your lower abdomen. The discomfort usually lasts only a few minutes after urinating. °HOME CARE INSTRUCTIONS  °· Rest at home until you feel your energy improving. °· Only take over-the-counter or prescription medicines for pain, discomfort, or fever as directed by your health care provider. Depending on the type of lithotripsy, you may need to take antibiotics and anti-inflammatory medicines for a few days. °· Drink enough water and fluids to keep your urine clear or pale yellow. This helps "flush" your kidneys. It helps pass any remaining pieces of stone and prevents stones from coming back. °· Most people can resume daily activities within 1-2 days after standard lithotripsy. It can take longer to recover from laser and percutaneous lithotripsy. °· Strain all urine through the provided strainer. Keep all particulate matter and stones for your health care provider to see. The stone may be as small as a grain of salt. It is very important to use the strainer each and every time you pass your urine. Any stones that are found can be sent to a  medical lab for examination. °· Visit your health care provider for a follow-up appointment in a few weeks. Your doctor may remove your stent if you have one. Your health care provider will also check to see whether stone particles still remain. °SEEK MEDICAL CARE IF:  °· Your pain is not relieved by medicine. °· You have a lasting nauseous feeling. °· You feel there is too much blood in the urine. °· You develop persistent problems with frequent or painful urination that does not at least partially improve after 2 days following the procedure. °· You have a congested cough. °· You feel lightheaded. °· You develop a rash or any other signs that might suggest an allergic problem. °· You develop any reaction or side effects to your medicine(s). °SEEK IMMEDIATE MEDICAL CARE IF:  °· You experience severe back or flank pain or both. °· You see nothing but blood when you urinate. °· You cannot pass any urine at all. °· You have a fever or shaking chills. °· You develop shortness of breath, difficulty breathing, or chest pain. °· You develop vomiting that will not   stop after 6-8 hours. °· You have a fainting episode. °  °This information is not intended to replace advice given to you by your health care provider. Make sure you discuss any questions you have with your health care provider. °  °Document Released: 04/03/2007 Document Revised: 12/03/2014 Document Reviewed: 09/27/2012 °Elsevier Interactive Patient Education ©2016 [employer] ° °

## 2015-08-18 ENCOUNTER — Other Ambulatory Visit: Payer: Self-pay | Admitting: Cardiology

## 2015-08-18 DIAGNOSIS — I6523 Occlusion and stenosis of bilateral carotid arteries: Secondary | ICD-10-CM

## 2015-08-21 ENCOUNTER — Ambulatory Visit (HOSPITAL_COMMUNITY)
Admission: RE | Admit: 2015-08-21 | Discharge: 2015-08-21 | Disposition: A | Discharge status: Home / Self Care | Payer: Managed Care, Other (non HMO) | Source: Ambulatory Visit | Attending: Cardiovascular Disease | Admitting: Cardiovascular Disease

## 2015-08-21 DIAGNOSIS — I2510 Atherosclerotic heart disease of native coronary artery without angina pectoris: Secondary | ICD-10-CM | POA: Diagnosis not present

## 2015-08-21 DIAGNOSIS — I6523 Occlusion and stenosis of bilateral carotid arteries: Secondary | ICD-10-CM | POA: Diagnosis present

## 2015-08-21 DIAGNOSIS — N189 Chronic kidney disease, unspecified: Secondary | ICD-10-CM | POA: Insufficient documentation

## 2015-08-21 DIAGNOSIS — E1122 Type 2 diabetes mellitus with diabetic chronic kidney disease: Secondary | ICD-10-CM | POA: Diagnosis not present

## 2015-08-21 DIAGNOSIS — E785 Hyperlipidemia, unspecified: Secondary | ICD-10-CM | POA: Insufficient documentation

## 2015-09-14 ENCOUNTER — Other Ambulatory Visit: Payer: Self-pay | Admitting: Cardiology

## 2015-12-04 ENCOUNTER — Encounter: Payer: Self-pay | Admitting: Cardiology

## 2015-12-05 ENCOUNTER — Other Ambulatory Visit: Payer: Self-pay | Admitting: Family Medicine

## 2015-12-05 DIAGNOSIS — M5416 Radiculopathy, lumbar region: Secondary | ICD-10-CM

## 2015-12-12 ENCOUNTER — Ambulatory Visit
Admission: RE | Admit: 2015-12-12 | Discharge: 2015-12-12 | Disposition: A | Discharge status: Home / Self Care | Payer: Managed Care, Other (non HMO) | Source: Ambulatory Visit | Attending: Family Medicine | Admitting: Family Medicine

## 2015-12-12 DIAGNOSIS — M5416 Radiculopathy, lumbar region: Secondary | ICD-10-CM

## 2015-12-20 NOTE — Progress Notes (Signed)
Mario Arnold Date of Birth: 07/31/1955 Medical Record #[id number]  History of Present Illness: Mario Arnold is seen for follow up CAD.  Has known CAD with past abnormal Myoview and remote PCI of the LAD in 2003. Last cath in 2013 following abnormal Myoview showed moderate 2 vessel disease with a 50% INR of the proximal LAD and a 70% lesion in an intermediate branch. EF of 50% - managed medically. Other issues include  thyroid nodule with normal TSH, prior TIA in 2013 on aggrenox, DM, HTN and HLD. Carotid dopplers done Jan 2017 were good. On follow up today he is doing well. Denies any chest pain. He continues to work long hours and finds little time to exercise.  His diabetes has been poorly controlled which he attributes to eating too many sweets and [place] grapes. Now sugar averaging 170.   Current Outpatient Prescriptions  Medication Sig Dispense Refill  . acetaminophen (TYLENOL) 500 MG tablet Take 1,000 mg by mouth as needed for mild pain.     [name]. atorvastatin (LIPITOR) 80 MG tablet Take 80 mg by mouth daily.    [name]. dipyridamole-aspirin (AGGRENOX) 200-25 MG 12hr capsule Take 1 capsule by mouth 2 (two) times daily.    [name]. ezetimibe (ZETIA) 10 MG tablet Take 10 mg by mouth every evening.    . fenofibrate 160 MG tablet TAKE ONE TABLET BY MOUTH EVERY EVENING 30 tablet 9  . glyBURIDE (DIABETA) 5 MG tablet Take 5 mg by mouth daily with breakfast.     . isosorbide mononitrate (IMDUR) 60 MG 24 hr tablet TAKE ONE TABLET BY MOUTH IN THE EVENING 30 tablet 3  . losartan (COZAAR) 25 MG tablet Take 25 mg by mouth daily.    . metFORMIN (GLUCOPHAGE) 500 MG tablet Take 500 mg by mouth 4 (four) times daily. Two in am and 2 in pm    . metoprolol tartrate (LOPRESSOR) 25 MG tablet Take 25 mg by mouth 2 (two) times daily.     . nitroGLYCERIN (NITROSTAT) 0.4 MG SL tablet Place 1 tablet (0.4 mg total) under the tongue every 5 (five) minutes as needed. 25 tablet 6  . Vitamin D, Ergocalciferol, (DRISDOL) 50000 units CAPS  capsule Take 1 capsule by mouth once a week.     No current facility-administered medications for this visit.     Allergies  Allergen Reactions  . Hydromorphone Nausea And Vomiting    Past Medical History:  Diagnosis Date  . Arthritis   . Carotid disease, bilateral (HCC) June 2013   less than 50% stenosis in the right and left   . Chronic kidney disease 2017   stones  . Coronary artery disease    STATUS POST ANGIOPLASTY OF THE LAD   . Diabetes mellitus    TYPE 2  . Dyslipidemia   . History of transient ischemic attack (TIA)    on aggrenox  . Myocardial infarction [facility](HCC) 1999   stent placement- per patient has 2 stents  . Thyroid nodule June 2013   had US with 4 mm small cystic nodule noted. Needs follow up US in 12 months. TSH is normal.     Past Surgical History:  Procedure Laterality Date  . CORONARY ANGIOPLASTY WITH STENT PLACEMENT  2003   LAD. EJECTION FRACTION IS 45%  . SHOULDER ARTHROSCOPY WITH OPEN ROTATOR CUFF REPAIR AND DISTAL CLAVICLE ACROMINECTOMY Right 09/05/2012   Procedure: RIGHT SHOULDER ARTHROSCOPY WITH LABRAL DEBRIDEMENT AND OPEN DISTAL CLAVICLE RESECTION, acromionectomy  AND OPEN ROTATOR CUFF REPAIR;  Surgeon: Drucilla Schmidt, MD;  Location: [facility];  Service: Orthopedics;  Laterality: Right;    History  Smoking Status  . Former Smoker  . Packs/day: 1.50  . Years: 23.00  . Types: Cigarettes  . Quit date: 03/28/1997  Smokeless Tobacco  . Former Neurosurgeon  . Types: Snuff, Chew    Comment: as a teen    History  Alcohol Use  . Yes    Comment: socially    Family History  Problem Relation Age of Onset  . Coronary artery disease Mother   . Heart attack Mother   . Diabetes Mother   . Transient ischemic attack Mother   . Heart disease Mother   . Cancer Father     Review of Systems: The review of systems is per the HPI.  He does have sciatica and is seeing Neurosurgery. All other systems were reviewed and are negative.  Physical Exam: BP 120/66  (BP Location: Right Arm, Patient Position: Sitting, Cuff Size: Normal)   Pulse (!) 57   Ht 5\' 7"  (1.702 m)   Wt 186 lb 3.2 oz (84.5 kg)   SpO2 96%   BMI 29.16 kg/m  Patient is very pleasant and in no acute distress. Skin is warm and dry. Color is normal.  HEENT is unremarkable. Normocephalic/atraumatic. PERRL. Sclera are nonicteric. Neck is supple. No masses. No JVD. Lungs are clear. Cardiac exam shows a regular rate and rhythm. Abdomen is soft. Extremities are without edema. Gait and ROM are intact. No gross neurologic deficits noted.  LABORATORY DATA:    Lab Results  Component Value Date   WBC 6.0 05/07/2013   HGB 14.8 05/07/2013   HCT 44.9 05/07/2013   PLT 202.0 05/07/2013   GLUCOSE 184 (H) 05/07/2013   CHOL 115 05/07/2013   TRIG 235.0 (H) 05/07/2013   HDL 29.30 (L) 05/07/2013   LDLDIRECT 55.5 05/07/2013   ALT 37 05/07/2013   AST 26 05/07/2013   NA 137 05/07/2013   K 4.1 05/07/2013   CL 107 05/07/2013   CREATININE 1.3 05/07/2013   BUN 20 05/07/2013   CO2 23 05/07/2013   TSH 2.71 09/13/2011   INR 1.0 08/24/2011   HGBA1C 10.8 (H) 05/17/2013   Carotid dopplers December 2016 showed no significant obstruction.  Labs reviewed from primary care on 12/04/15: cholesterol 139, triglycerides 628, LDL 40, HDL 24. A1c 12. Glucose 250. BUN 19, creatinine 1.23. Other chemistries and CBC normal.   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 No acute change. I have personally reviewed and interpreted this study.  Assessment / Plan: 1. CAD - remote PCI of the LAD - known 2 vessel CAD with 50% ISR of the LAD and 70% intermediate lesion in 2013 - managed medically - he is asymptomatic.  Continue with CV risk factor modification. See back in 6-8  months.   2. HTN - BP well controlled.   3. HLD - excellent control on Lipitor,  Zetia   4. DM type 2. Poorly controlled. Discussed appropriate dietary modification. Follow up with primary care.

## 2015-12-21 ENCOUNTER — Encounter: Payer: Self-pay | Admitting: Cardiology

## 2015-12-21 ENCOUNTER — Ambulatory Visit (INDEPENDENT_AMBULATORY_CARE_PROVIDER_SITE_OTHER): Payer: Managed Care, Other (non HMO) | Admitting: Cardiology

## 2015-12-21 VITALS — BP 120/66 | HR 57 | Ht 67.0 in | Wt 186.2 lb

## 2015-12-21 DIAGNOSIS — I2510 Atherosclerotic heart disease of native coronary artery without angina pectoris: Secondary | ICD-10-CM

## 2015-12-21 DIAGNOSIS — E119 Type 2 diabetes mellitus without complications: Secondary | ICD-10-CM | POA: Diagnosis not present

## 2015-12-21 DIAGNOSIS — E785 Hyperlipidemia, unspecified: Secondary | ICD-10-CM

## 2015-12-21 NOTE — Patient Instructions (Signed)
Continue your current therapy  Work on getting your sugars under control. Avoid sweets and the "white" carbs.  I will see you in 6-8 months.

## 2016-01-06 ENCOUNTER — Other Ambulatory Visit: Payer: Self-pay | Admitting: Neurosurgery

## 2016-01-06 DIAGNOSIS — M5416 Radiculopathy, lumbar region: Secondary | ICD-10-CM

## 2016-01-14 ENCOUNTER — Inpatient Hospital Stay
Admission: RE | Admit: 2016-01-14 | Discharge: 2016-01-14 | Disposition: A | Discharge status: Home / Self Care | Payer: Managed Care, Other (non HMO) | Source: Ambulatory Visit | Attending: Neurosurgery | Admitting: Neurosurgery

## 2016-01-14 NOTE — Discharge Instructions (Signed)

## 2016-01-25 ENCOUNTER — Other Ambulatory Visit: Payer: Self-pay | Admitting: Cardiology

## 2016-01-25 ENCOUNTER — Other Ambulatory Visit: Payer: Managed Care, Other (non HMO)

## 2016-01-25 NOTE — Telephone Encounter (Signed)
Rx request sent to pharmacy.  

## 2016-03-31 ENCOUNTER — Other Ambulatory Visit: Payer: Self-pay | Admitting: Cardiology

## 2016-09-23 ENCOUNTER — Encounter: Payer: Self-pay | Admitting: Physician Assistant

## 2016-09-23 ENCOUNTER — Ambulatory Visit (INDEPENDENT_AMBULATORY_CARE_PROVIDER_SITE_OTHER): Payer: Managed Care, Other (non HMO) | Admitting: Physician Assistant

## 2016-09-23 VITALS — BP 125/75 | HR 64 | Ht 67.0 in | Wt 184.6 lb

## 2016-09-23 DIAGNOSIS — E785 Hyperlipidemia, unspecified: Secondary | ICD-10-CM | POA: Diagnosis not present

## 2016-09-23 DIAGNOSIS — E119 Type 2 diabetes mellitus without complications: Secondary | ICD-10-CM

## 2016-09-23 DIAGNOSIS — R079 Chest pain, unspecified: Secondary | ICD-10-CM

## 2016-09-23 DIAGNOSIS — I739 Peripheral vascular disease, unspecified: Secondary | ICD-10-CM

## 2016-09-23 DIAGNOSIS — I779 Disorder of arteries and arterioles, unspecified: Secondary | ICD-10-CM

## 2016-09-23 DIAGNOSIS — I25119 Atherosclerotic heart disease of native coronary artery with unspecified angina pectoris: Secondary | ICD-10-CM | POA: Diagnosis not present

## 2016-09-23 NOTE — Patient Instructions (Addendum)
Medication Instructions:   No change  Labwork:   None  Testing/Procedures:  Your physician has requested that you have an exercise stress myoview. For further information please visit [email or web address].   General outlines for this test are as follows: Nothing to eat or drink the morning of test (you may have small slips of water to take morning medications). On the morning of test, do not take your metoprolol or any diabetes medications. Also, do not consume any caffeine within 12 hours of the test (in case we need you to undergo the lexiscan (chemical stress test) instead of exercise stress myoview.   Follow-Up:  With Dr. SwazilandJordan in 6 months (we will call w results of stress test and if abnormal, advise on a sooner return visit)   If you need a refill on your cardiac medications before your next appointment, please call your pharmacy.

## 2016-09-23 NOTE — Progress Notes (Signed)
Cardiology Office Note    Date:  09/24/2016   ID:  Mario Arnold, DOB 02/19/1956, MRN [id number]  PCP:  Catha GosselinLittle, Kevin, MD  Cardiologist:  Dr. SwazilandJordan   Chief Complaint  Patient presents with  . Follow-up    seen for Dr. SwazilandJordan    History of Present Illness:  Mario CockingBuddy L Arnold is a 61 y.o. male with PMH of carotid artery disease, CAD s/p PCI to LAD 2003, DM II and HLD. Last catheterization in 2013 following an abnormal Myoview showed moderate 2 vessel disease with 50% stenosis in proximal LAD and a 70% lesion in intermediate branch, EF 50% manage medically. He has a thyroid nodule with normal TSH. He also has prior TIA in 2013 on Aggrenox. Carotid Doppler in 07/2015 were negative for significant disease.  His last office visit with Dr. SwazilandJordan was on 12/21/2015, at which time she was doing well. 6-8 month follow-up was recommended. His DM was poorly controlled at the time with Hgb A1C 12, this was deferred to primary care physician. For the past several months, he has been noticing worsening dyspnea on exertion, this is reminiscent of his symptom prior to the previous PCI. He also has occasional chest pressure as well that occurs with exertion and relieved with rest. His symptom is concerning for angina, I recommended a treadmill Myoview. Otherwise, he denies any lower extremity edema, orthopnea or PND.   Past Medical History:  Diagnosis Date  . Arthritis   . Carotid disease, bilateral (HCC) June 2013   less than 50% stenosis in the right and left   . Chronic kidney disease 2017   stones  . Coronary artery disease    STATUS POST ANGIOPLASTY OF THE LAD   . Diabetes mellitus    TYPE 2  . Dyslipidemia   . History of transient ischemic attack (TIA)    on aggrenox  . Myocardial infarction [facility](HCC) 1999   stent placement- per patient has 2 stents  . Thyroid nodule June 2013   had US with 4 mm small cystic nodule noted. Needs follow up US in 12 months. TSH is normal.     Past Surgical History:   Procedure Laterality Date  . CORONARY ANGIOPLASTY WITH STENT PLACEMENT  2003   LAD. EJECTION FRACTION IS 45%  . SHOULDER ARTHROSCOPY WITH OPEN ROTATOR CUFF REPAIR AND DISTAL CLAVICLE ACROMINECTOMY Right 09/05/2012   Procedure: RIGHT SHOULDER ARTHROSCOPY WITH LABRAL DEBRIDEMENT AND OPEN DISTAL CLAVICLE RESECTION, acromionectomy  AND OPEN ROTATOR CUFF REPAIR;  Surgeon: Drucilla SchmidtJames P Aplington, MD;  Location: [facility];  Service: Orthopedics;  Laterality: Right;    Current Medications: Outpatient Medications Prior to Visit  Medication Sig Dispense Refill  . acetaminophen (TYLENOL) 500 MG tablet Take 1,000 mg by mouth as needed for mild pain.     [name]. atorvastatin (LIPITOR) 80 MG tablet Take 80 mg by mouth daily.    [name]. dipyridamole-aspirin (AGGRENOX) 200-25 MG 12hr capsule Take 1 capsule by mouth 2 (two) times daily.    [name]. ezetimibe (ZETIA) 10 MG tablet Take 10 mg by mouth every evening.    . fenofibrate 160 MG tablet TAKE ONE TABLET BY MOUTH IN THE EVENING 30 tablet 4  . glyBURIDE (DIABETA) 5 MG tablet Take 5 mg by mouth daily with breakfast.     . isosorbide mononitrate (IMDUR) 60 MG 24 hr tablet TAKE ONE TABLET BY MOUTH IN THE EVENING 30 tablet 8  . losartan (COZAAR) 25 MG tablet Take 25 mg by mouth daily.    .[name]  metFORMIN (GLUCOPHAGE) 500 MG tablet Take 500 mg by mouth 4 (four) times daily. Two in am and 2 in pm    . metoprolol tartrate (LOPRESSOR) 25 MG tablet Take 25 mg by mouth 2 (two) times daily.     . nitroGLYCERIN (NITROSTAT) 0.4 MG SL tablet Place 1 tablet (0.4 mg total) under the tongue every 5 (five) minutes as needed. 25 tablet 6  . Vitamin D, Ergocalciferol, (DRISDOL) 50000 units CAPS capsule Take 1 capsule by mouth once a week.     No facility-administered medications prior to visit.      Allergies:   Hydromorphone   Social History   Social History  . Marital status: Married    Spouse name: N/A  . Number of children: 2  . Years of education: N/A   Occupational History  . Repair  semi trailers.    Social History Main Topics  . Smoking status: Former Smoker    Packs/day: 1.50    Years: 23.00    Types: Cigarettes    Quit date: 03/28/1997  . Smokeless tobacco: Former Neurosurgeon    Types: Snuff, Chew     Comment: as a teen  . Alcohol use Yes     Comment: socially  . Drug use: No  . Sexual activity: Yes   Other Topics Concern  . None   Social History Narrative  . None     Family History:  The patient's family history includes Cancer in his father; Coronary artery disease in his mother; Diabetes in his mother; Heart attack in his mother; Heart disease in his mother; Transient ischemic attack in his mother.   ROS:   Please see the history of present illness.    ROS All other systems reviewed and are negative.   PHYSICAL EXAM:   VS:  BP 125/75 (BP Location: Right Arm, Patient Position: Sitting, Cuff Size: Normal)   Pulse 64   Ht 5\' 7"  (1.702 m)   Wt 184 lb 9.6 oz (83.7 kg)   BMI 28.91 kg/m    GEN: Well nourished, well developed, in no acute distress  HEENT: normal  Neck: no JVD, carotid bruits, or masses Cardiac: RRR; no murmurs, rubs, or gallops,no edema  Respiratory:  clear to auscultation bilaterally, normal work of breathing GI: soft, nontender, nondistended, + BS MS: no deformity or atrophy  Skin: warm and dry, no rash Neuro:  Alert and Oriented x 3, Strength and sensation are intact Psych: euthymic mood, full affect  Wt Readings from Last 3 Encounters:  09/23/16 184 lb 9.6 oz (83.7 kg)  12/21/15 186 lb 3.2 oz (84.5 kg)  06/25/15 183 lb 6.4 oz (83.2 kg)      Studies/Labs Reviewed:   EKG:  EKG is ordered today.  The ekg ordered today demonstrates Normal sinus rhythm with poor progression anteriorly  Recent Labs: No results found for requested labs within last 8760 hours.   Lipid Panel    Component Value Date/Time   CHOL 115 05/07/2013 0855   TRIG 235.0 (H) 05/07/2013 0855   HDL 29.30 (L) 05/07/2013 0855   CHOLHDL 4 05/07/2013 0855    VLDL 47.0 (H) 05/07/2013 0855   LDLDIRECT 55.5 05/07/2013 0855    Additional studies/ records that were reviewed today include:   Cath 08/30/2011 Coronary angiography: Coronary dominance: right  Left mainstem: There is mild tapering of the distal left main less than 20%.  Left anterior descending (LAD): The LAD is a large vessel which extends around the apex. There is  a stent in the proximal to mid vessel. Within the stent there is one focal area of 50-60% narrowing at the takeoff of the first septal perforator.  There is a modest sized intermediate branch which has a 70% stenosis proximally.  Left circumflex (LCx): The left circumflex is relatively small vessel that courses predominantly in the AV groove. Has no significant disease.   Right coronary artery (RCA): The right coronary is a large dominant vessel. Has somewhat diffuse disease throughout the proximal to mid vessel up to 30%.  Left ventriculography: Left ventricular systolic function is abnormal, there is moderate apical hypokinesis, LVEF is estimated at 50 %, there is no significant mitral regurgitation   Final Conclusions:   1. Moderate two-vessel coronary disease. There is a focal 50-60% in-stent stenosis in the LAD. There is a 70% stenosis in the intermediate branch. 2. Mild left ventricular dysfunction.  Recommendations: We will review this patient's data. He has been asymptomatic so medical therapy may be a reasonable option.     Carotid doppler 08/21/2015 Essentially normal carotid arteries. Patent vertebral arteries with antegrade flow. Normal subclavian arteries, bilaterally.     ASSESSMENT:    1. Exertional chest pain   2. Coronary artery disease involving native coronary artery of native heart with angina pectoris (HCC)   3. Hyperlipidemia, unspecified hyperlipidemia type   4. Controlled type 2 diabetes mellitus without complication, without long-term current use of insulin (HCC)   5. Bilateral  carotid artery disease (HCC)      PLAN:  In order of problems listed above:  1. Exertional chest pain and dyspnea with exertion: Symptom concerning for angina. This is reminiscent of his previous symptoms. I recommended a treadmill Myoview.  2. CAD: Last catheterization in 2013 following an abnormal Myoview showed moderate 2 vessel disease with 50% stenosis in proximal LAD and a 70% lesion in intermediate branch, EF 50% manage medically  3. Hyperlipidemia: On Zetia and fenofibrate. According to outside records, it seems to suggest his triglyceride level was over 600 on the last lab (although the faxed copy was blurry and difficult to ascertain).   4. DM 2: On metformin  5. Carotid artery disease: Last ultrasound 08/10/2011, less than 50% stenosis in bilateral ICA. Consider order repeat carotid ultrasound on follow-up.    Medication Adjustments/Labs and Tests Ordered: Current medicines are reviewed at length with the patient today.  Concerns regarding medicines are outlined above.  Medication changes, Labs and Tests ordered today are listed in the Patient Instructions below. Patient Instructions  Medication Instructions:   No change  Labwork:   None  Testing/Procedures:  Your physician has requested that you have an exercise stress myoview. For further information please visit [email or web address]   General outlines for this test are as follows: Nothing to eat or drink the morning of test (you may have small slips of water to take morning medications). On the morning of test, do not take your metoprolol or any diabetes medications. Also, do not consume any caffeine within 12 hours of the test (in case we need you to undergo the lexiscan (chemical stress test) instead of exercise stress myoview.   Follow-Up:  With Dr. Swaziland in 6 months (we will call w results of stress test and if abnormal, advise on a sooner return visit)   If you need a refill on your cardiac medications  before your next appointment, please call your pharmacy.      Ramond Dial, [place]  09/24/2016 12:58 PM    [facility] [address], [place], [place]  [address] Phone: [phone]; Fax: [phone]

## 2016-09-24 ENCOUNTER — Encounter: Payer: Self-pay | Admitting: Physician Assistant

## 2016-09-27 ENCOUNTER — Telehealth (HOSPITAL_COMMUNITY): Payer: Self-pay

## 2016-09-27 NOTE — Telephone Encounter (Signed)
Close encounter 

## 2016-09-29 ENCOUNTER — Telehealth (HOSPITAL_COMMUNITY): Payer: Self-pay

## 2016-09-29 NOTE — Telephone Encounter (Signed)
Encounter complete. 

## 2016-09-30 ENCOUNTER — Ambulatory Visit (HOSPITAL_COMMUNITY)
Admission: RE | Admit: 2016-09-30 | Discharge: 2016-09-30 | Disposition: A | Discharge status: Home / Self Care | Payer: Managed Care, Other (non HMO) | Source: Ambulatory Visit | Attending: Cardiovascular Disease | Admitting: Cardiovascular Disease

## 2016-09-30 DIAGNOSIS — Z6828 Body mass index (BMI) 28.0-28.9, adult: Secondary | ICD-10-CM | POA: Diagnosis not present

## 2016-09-30 DIAGNOSIS — I2510 Atherosclerotic heart disease of native coronary artery without angina pectoris: Secondary | ICD-10-CM | POA: Insufficient documentation

## 2016-09-30 DIAGNOSIS — E663 Overweight: Secondary | ICD-10-CM | POA: Diagnosis not present

## 2016-09-30 DIAGNOSIS — Z8673 Personal history of transient ischemic attack (TIA), and cerebral infarction without residual deficits: Secondary | ICD-10-CM | POA: Diagnosis not present

## 2016-09-30 DIAGNOSIS — I10 Essential (primary) hypertension: Secondary | ICD-10-CM | POA: Diagnosis not present

## 2016-09-30 DIAGNOSIS — I252 Old myocardial infarction: Secondary | ICD-10-CM | POA: Insufficient documentation

## 2016-09-30 DIAGNOSIS — R002 Palpitations: Secondary | ICD-10-CM | POA: Diagnosis not present

## 2016-09-30 DIAGNOSIS — R5383 Other fatigue: Secondary | ICD-10-CM | POA: Diagnosis not present

## 2016-09-30 DIAGNOSIS — Z87891 Personal history of nicotine dependence: Secondary | ICD-10-CM | POA: Insufficient documentation

## 2016-09-30 DIAGNOSIS — Z8249 Family history of ischemic heart disease and other diseases of the circulatory system: Secondary | ICD-10-CM | POA: Diagnosis not present

## 2016-09-30 DIAGNOSIS — E119 Type 2 diabetes mellitus without complications: Secondary | ICD-10-CM | POA: Insufficient documentation

## 2016-09-30 DIAGNOSIS — R079 Chest pain, unspecified: Secondary | ICD-10-CM | POA: Diagnosis not present

## 2016-09-30 DIAGNOSIS — R42 Dizziness and giddiness: Secondary | ICD-10-CM | POA: Insufficient documentation

## 2016-09-30 DIAGNOSIS — R0609 Other forms of dyspnea: Secondary | ICD-10-CM | POA: Insufficient documentation

## 2016-09-30 LAB — MYOCARDIAL PERFUSION IMAGING
CHL RATE OF PERCEIVED EXERTION: 17
CSEPEDS: 20 s
CSEPPHR: 151 {beats}/min
Estimated workload: 13.4 METS
Exercise duration (min): 11 min
LVDIAVOL: 97 mL (ref 62–150)
LVSYSVOL: 49 mL
MPHR: 161 {beats}/min
Percent HR: 93 %
Rest HR: 59 {beats}/min
SDS: 2
SRS: 0
SSS: 2
TID: 1.29

## 2016-09-30 MED ORDER — TECHNETIUM TC 99M TETROFOSMIN IV KIT
32.0000 | PACK | Freq: Once | INTRAVENOUS | Status: AC | PRN
  Administered 2016-09-30: 32 via INTRAVENOUS
  Filled 2016-09-30: qty 32

## 2016-09-30 MED ORDER — TECHNETIUM TC 99M TETROFOSMIN IV KIT
10.2000 | PACK | Freq: Once | INTRAVENOUS | Status: AC | PRN
  Administered 2016-09-30: 10.2 via INTRAVENOUS
  Filled 2016-09-30: qty 11

## 2016-10-03 NOTE — Progress Notes (Signed)
Stress test report reviewed with Dr. SwazilandJordan, felt to be low risk. Recommend observation for now

## 2016-11-06 ENCOUNTER — Other Ambulatory Visit: Payer: Self-pay | Admitting: Cardiology

## 2016-11-07 ENCOUNTER — Other Ambulatory Visit: Payer: Self-pay | Admitting: Cardiology

## 2017-04-10 ENCOUNTER — Telehealth: Payer: Self-pay | Admitting: *Deleted

## 2017-04-10 NOTE — Telephone Encounter (Signed)
   [facility] Pre-operative Risk Assessment    Request for surgical clearance:  1. What type of surgery is being performed? Lumbar L3-4 ESL   2. When is this surgery scheduled? Not scheduled   3. Are there any medications that need to be held prior to surgery and how long? Aggrenox 4 days prior?   4. Practice name and name of physician performing surgery? Dr. Clydell Hakim, [facility]   5. What is your office phone and fax number? [phone], Fax: [phone]   6. Anesthesia type (None, local, MAC, general) ? Not listed   Mario Arnold 04/10/2017, 5:20 PM  _________________________________________________________________   (provider comments below)

## 2017-04-12 NOTE — Telephone Encounter (Signed)
   Primary Cardiologist:No primary care provider on file.  Chart reviewed as part of pre-operative protocol coverage. The patient has appointment with Dr. SwazilandJordan 05/24/16 for routine follow up.  Left voice mail to call back to review cardiac symptoms.   [place] CityBhavinkumar Josanna Hefel, [place]  04/12/2017, 4:36 PM

## 2017-04-17 NOTE — Telephone Encounter (Signed)
Follow Up   Patient is returning Mario Arnold phone call

## 2017-04-17 NOTE — Telephone Encounter (Signed)
   Primary Cardiologist: Peter SwazilandJordan, MD  Chart reviewed as part of pre-operative protocol coverage. Given past medical history, negative stress test and lack of cardiac symptoms, based on ACC/AHA guidelines, Mario Arnold would be at acceptable risk for the planned procedure without further cardiovascular testing.   It is ok to hold his Aggrenox for 5 days prior to the procedure and hold the metformin the day of the procedure.  I will route this recommendation to the requesting party via Epic fax function and remove from pre-op pool.  Please call with questions.  Theodore Demarkhonda Tyshika Baldridge, PA-C 04/17/2017, 4:08 PM

## 2017-04-17 NOTE — Telephone Encounter (Signed)
   Left a message for the patient that we need to talk to him unless the surgery is not to be done until after he sees Dr. SwazilandJordan 05/24/2017.  Mario DemarkRhonda Barrett, PA-C 04/17/2017 2:43 PM Beeper [phone]

## 2017-04-25 ENCOUNTER — Other Ambulatory Visit: Payer: Self-pay | Admitting: Family Medicine

## 2017-04-25 DIAGNOSIS — I779 Disorder of arteries and arterioles, unspecified: Secondary | ICD-10-CM

## 2017-05-19 NOTE — Progress Notes (Signed)
Mario Arnold Date of Birth: [birth year] Medical Record #[id number]  History of Present Illness: Mario Arnold is seen for follow up CAD.  Has known CAD with  remote PCI of the LAD in 2003. Last cath in 2013 following abnormal Myoview showed moderate 2 vessel disease with a 50% in stent stenosis of the proximal LAD and a 70% lesion in an intermediate branch. EF of 50% - managed medically. Other issues include  thyroid nodule with normal TSH, prior TIA in 2013, DM, HTN and HLD. Carotid dopplers done May 2017 were good.  He was seen in June 2018 with symptoms of increased dyspnea on exertion. Myoview was repeated and felt to be low risk.   On follow up today he is doing well. He has no angina, dyspnea, palpitations, edema. He is still working long hours. Has problems with chronic sciatica. Not felt to be a surgical candidate and gets several injections yearly. Now on Jardiance with improvement in A1c from 10.4 to 7.5.   Current Outpatient Medications  Medication Sig Dispense Refill  . acetaminophen (TYLENOL) 500 MG tablet Take 1,000 mg by mouth as needed for mild pain.     [name] atorvastatin (LIPITOR) 80 MG tablet Take 80 mg by mouth daily.    [name] dipyridamole-aspirin (AGGRENOX) 200-25 MG 12hr capsule Take 1 capsule by mouth 2 (two) times daily.    [name] ezetimibe (ZETIA) 10 MG tablet Take 10 mg by mouth every evening.    . fenofibrate 160 MG tablet TAKE ONE TABLET BY MOUTH ONCE DAILY IN THE EVENING 30 tablet 4  . glyBURIDE (DIABETA) 5 MG tablet Take 10 mg by mouth 2 (two) times daily with a meal.     . isosorbide mononitrate (IMDUR) 60 MG 24 hr tablet TAKE ONE TABLET BY MOUTH ONCE DAILY IN THE EVENING 30 tablet 8  . JARDIANCE 25 MG TABS tablet Take 1 tablet by mouth daily.    [name] losartan (COZAAR) 25 MG tablet Take 25 mg by mouth daily.    . metFORMIN (GLUCOPHAGE) 500 MG tablet Take 500 mg by mouth 4 (four) times daily. Two in am and 2 in pm    . metoprolol tartrate (LOPRESSOR) 25 MG tablet Take 25 mg by mouth 2  (two) times daily.     . nitroGLYCERIN (NITROSTAT) 0.4 MG SL tablet Place 1 tablet (0.4 mg total) under the tongue every 5 (five) minutes as needed. 25 tablet 6  . traMADol (ULTRAM) 50 MG tablet Take 1 tablet by mouth 2 (two) times daily as needed.     No current facility-administered medications for this visit.     Allergies  Allergen Reactions  . Hydromorphone Nausea And Vomiting    Past Medical History:  Diagnosis Date  . Arthritis   . Carotid disease, bilateral (HCC) June 2013   less than 50% stenosis in the right and left   . Chronic kidney disease 2017   stones  . Coronary artery disease    STATUS POST ANGIOPLASTY OF THE LAD   . Diabetes mellitus    TYPE 2  . Dyslipidemia   . History of transient ischemic attack (TIA)    on aggrenox  . Myocardial infarction [facility]) 1999   stent placement- per patient has 2 stents  . Thyroid nodule June 2013   had [place] with 4 mm small cystic nodule noted. Needs follow up US in 12 months. TSH is normal.     Past Surgical History:  Procedure Laterality Date  . CORONARY ANGIOPLASTY WITH  STENT PLACEMENT  2003   LAD. EJECTION FRACTION IS 45%  . SHOULDER ARTHROSCOPY WITH OPEN ROTATOR CUFF REPAIR AND DISTAL CLAVICLE ACROMINECTOMY Right 09/05/2012   Procedure: RIGHT SHOULDER ARTHROSCOPY WITH LABRAL DEBRIDEMENT AND OPEN DISTAL CLAVICLE RESECTION, acromionectomy  AND OPEN ROTATOR CUFF REPAIR;  Surgeon: Drucilla Schmidt, MD;  Location: [facility];  Service: Orthopedics;  Laterality: Right;    Social History   Tobacco Use  Smoking Status Former Smoker  . Packs/day: 1.50  . Years: 23.00  . Pack years: 34.50  . Types: Cigarettes  . Last attempt to quit: 03/28/1997  . Years since quitting: 20.1  Smokeless Tobacco Former Neurosurgeon  . Types: Snuff, Chew  Tobacco Comment   as a teen    Social History   Substance and Sexual Activity  Alcohol Use Yes   Comment: socially    Family History  Problem Relation Age of Onset  . Coronary artery disease  Mother   . Heart attack Mother   . Diabetes Mother   . Transient ischemic attack Mother   . Heart disease Mother   . Cancer Father     Review of Systems: The review of systems is per the HPI.  All other systems were reviewed and are negative.  Physical Exam: BP 120/78   Pulse 77   Ht 5\' 7"  (1.702 m)   Wt 180 lb (81.6 kg)   BMI 28.19 kg/m  GENERAL:  Well appearing WM in NAD HEENT:  PERRL, EOMI, sclera are clear. Oropharynx is clear. NECK:  No jugular venous distention, carotid upstroke brisk and symmetric, no bruits, no thyromegaly or adenopathy LUNGS:  Clear to auscultation bilaterally CHEST:  Unremarkable HEART:  RRR,  PMI not displaced or sustained,S1 and S2 within normal limits, no S3, no S4: no clicks, no rubs, no murmurs ABD:  Soft, nontender. BS +, no masses or bruits. No hepatomegaly, no splenomegaly EXT:  2 + pulses throughout, no edema, no cyanosis no clubbing SKIN:  Warm and dry.  No rashes NEURO:  Alert and oriented x 3. Cranial nerves II through XII intact. PSYCH:  Cognitively intact    LABORATORY DATA:    Lab Results  Component Value Date   WBC 6.0 05/07/2013   HGB 14.8 05/07/2013   HCT 44.9 05/07/2013   PLT 202.0 05/07/2013   GLUCOSE 184 (H) 05/07/2013   CHOL 115 05/07/2013   TRIG 235.0 (H) 05/07/2013   HDL 29.30 (L) 05/07/2013   LDLDIRECT 55.5 05/07/2013   ALT 37 05/07/2013   AST 26 05/07/2013   NA 137 05/07/2013   K 4.1 05/07/2013   CL 107 05/07/2013   CREATININE 1.3 05/07/2013   BUN 20 05/07/2013   CO2 23 05/07/2013   TSH 2.71 09/13/2011   INR 1.0 08/24/2011   HGBA1C 10.8 (H) 05/17/2013   Carotid dopplers May 2017 showed no significant obstruction.  Labs reviewed from primary care on 12/04/15: cholesterol 139, triglycerides 628, LDL 40, HDL 24. A1c 12. Glucose 250. BUN 19, creatinine 1.23. Other chemistries and CBC normal.  Dated 09/23/16: glucose 237, creatinine 1.3. A1c 10.4. Cholesterol 110, triglycerides 124. HDL 27, LDL 59.  Dated  04/21/17: A1c 7.5. Creatinine 1.39. Other chemistries normal.  Myoview 09/30/16: Study Highlights    Clincally negative, electrically positive for ischemia Excellent exercise tolerance  Basal inferior, inferolateral defect that normalizes in recovery period. May reflect soft tissue attenuation, cannot exclude very mild ischemia  LVEF 50%  Low to intermediate risk study    Assessment / Plan: 1.  CAD - remote PCI of the LAD - known 2 vessel CAD with 50% ISR of the LAD and 70% intermediate lesion in 2013 - managed medically. Low risk Myoview in July 2018. He remains asymptomatic.  Continue with CV risk factor modification.   2. HTN - BP well controlled.   3. HLD - excellent control on Lipitor,  Zetia   4. DM type 2. Significant improvement in control with Jardiance.  Will continue current therapy. Follow up in 6-8 months.

## 2017-05-24 ENCOUNTER — Ambulatory Visit: Payer: Managed Care, Other (non HMO) | Admitting: Cardiology

## 2017-05-24 ENCOUNTER — Ambulatory Visit
Admission: RE | Admit: 2017-05-24 | Discharge: 2017-05-24 | Disposition: A | Discharge status: Home / Self Care | Payer: Managed Care, Other (non HMO) | Source: Ambulatory Visit | Attending: Family Medicine | Admitting: Family Medicine

## 2017-05-24 ENCOUNTER — Encounter: Payer: Self-pay | Admitting: Cardiology

## 2017-05-24 VITALS — BP 120/78 | HR 77 | Ht 67.0 in | Wt 180.0 lb

## 2017-05-24 DIAGNOSIS — I6523 Occlusion and stenosis of bilateral carotid arteries: Secondary | ICD-10-CM

## 2017-05-24 DIAGNOSIS — I10 Essential (primary) hypertension: Secondary | ICD-10-CM | POA: Diagnosis not present

## 2017-05-24 DIAGNOSIS — E785 Hyperlipidemia, unspecified: Secondary | ICD-10-CM

## 2017-05-24 DIAGNOSIS — I779 Disorder of arteries and arterioles, unspecified: Secondary | ICD-10-CM

## 2017-05-24 DIAGNOSIS — I25119 Atherosclerotic heart disease of native coronary artery with unspecified angina pectoris: Secondary | ICD-10-CM

## 2017-05-24 NOTE — Patient Instructions (Addendum)
Continue your current therapy  I will see you in 6-8 months. 

## 2017-06-12 ENCOUNTER — Other Ambulatory Visit: Payer: Self-pay

## 2017-06-12 ENCOUNTER — Encounter: Payer: Self-pay | Admitting: Surgery

## 2017-06-12 ENCOUNTER — Ambulatory Visit: Payer: Managed Care, Other (non HMO) | Admitting: Surgery

## 2017-06-12 VITALS — BP 112/71 | HR 66 | Resp 20 | Ht 67.0 in | Wt 180.6 lb

## 2017-06-12 DIAGNOSIS — I6523 Occlusion and stenosis of bilateral carotid arteries: Secondary | ICD-10-CM | POA: Diagnosis not present

## 2017-06-12 NOTE — Progress Notes (Signed)
[facility]  Patient name: Mario Arnold MRN: [id number] DOB: [birth year] Sex: male   REQUESTING PROVIDER:    Dr. Clarene Duke   REASON FOR CONSULT:    Carotid stenosis  HISTORY OF PRESENT ILLNESS:   Mario Arnold is a 62 y.o. male, who is referred here for further evaluation of his carotid disease.  Patient states that several years ago he experienced a TIA.  He states that he had right arm clumsiness and near paralysis.  He was also very foggy and had trouble speaking.  His symptoms lasted for approximately 30 seconds and completely resolved.  He has not had any further episodes.  He most recently had a carotid duplex which showed less than 50% left carotid stenosis and 50-69% stenosis in the right carotid artery which was a progression of his disease.  He is referred here for further evaluation.  Patient suffers from diabetes.  This is not been well controlled with hemoglobin A1c greater than 7.  He has a history of coronary artery disease, having undergone PCI in the past.  He is medically treated for hypertension.  He takes a statin for hypercholesterolemia.  He is a former smoker.  PAST MEDICAL HISTORY    Past Medical History:  Diagnosis Date  . Arthritis   . Carotid disease, bilateral (HCC) June 2013   less than 50% stenosis in the right and left   . Chronic kidney disease 2017   stones  . Coronary artery disease    STATUS POST ANGIOPLASTY OF THE LAD   . Diabetes mellitus    TYPE 2  . Dyslipidemia   . History of transient ischemic attack (TIA)    on aggrenox  . Hyperlipidemia   . Myocardial infarction [facility]) 1999   stent placement- per patient has 2 stents  . Stroke (HCC)   . Thyroid nodule June 2013   had [place] with 4 mm small cystic nodule noted. Needs follow up US in 12 months. TSH is normal.      FAMILY HISTORY   Family History  Problem Relation Age of Onset  . Coronary artery disease Mother   . Heart  attack Mother   . Diabetes Mother   . Transient ischemic attack Mother   . Heart disease Mother   . Cancer Father     SOCIAL HISTORY:   Social History   Socioeconomic History  . Marital status: Married    Spouse name: Not on file  . Number of children: 2  . Years of education: Not on file  . Highest education level: Not on file  Social Needs  . Financial resource strain: Not on file  . Food insecurity - worry: Not on file  . Food insecurity - inability: Not on file  . Transportation needs - medical: Not on file  . Transportation needs - non-medical: Not on file  Occupational History  . Occupation: Repair semi trailers.  Tobacco Use  . Smoking status: Former Smoker    Packs/day: 1.50    Years: 23.00    Pack years: 34.50    Types: Cigarettes    Last attempt to quit: 03/28/1997    Years since quitting: 20.2  . Smokeless tobacco: Former Neurosurgeon    Types: Snuff, Chew  . Tobacco comment: as a teen  Substance and Sexual Activity  . Alcohol use: Yes    Comment: socially  . Drug use: No  . Sexual activity: Yes  Other Topics Concern  . Not  on file  Social History Narrative  . Not on file    ALLERGIES:    Allergies  Allergen Reactions  . Hydromorphone Nausea And Vomiting    CURRENT MEDICATIONS:    Current Outpatient Medications  Medication Sig Dispense Refill  . acetaminophen (TYLENOL) 500 MG tablet Take 1,000 mg by mouth as needed for mild pain.     [name]. atorvastatin (LIPITOR) 80 MG tablet Take 80 mg by mouth daily.    [name]. dipyridamole-aspirin (AGGRENOX) 200-25 MG 12hr capsule Take 1 capsule by mouth 2 (two) times daily.    [name]. ezetimibe (ZETIA) 10 MG tablet Take 10 mg by mouth every evening.    . fenofibrate 160 MG tablet TAKE ONE TABLET BY MOUTH ONCE DAILY IN THE EVENING 30 tablet 4  . glyBURIDE (DIABETA) 5 MG tablet Take 10 mg by mouth 2 (two) times daily with a meal.     . isosorbide mononitrate (IMDUR) 60 MG 24 hr tablet TAKE ONE TABLET BY MOUTH ONCE DAILY IN THE  EVENING 30 tablet 8  . JARDIANCE 25 MG TABS tablet Take 1 tablet by mouth daily.    [name]. losartan (COZAAR) 25 MG tablet Take 25 mg by mouth daily.    . metFORMIN (GLUCOPHAGE) 500 MG tablet Take 500 mg by mouth 4 (four) times daily. Two in am and 2 in pm    . metoprolol tartrate (LOPRESSOR) 25 MG tablet Take 25 mg by mouth 2 (two) times daily.     . nitroGLYCERIN (NITROSTAT) 0.4 MG SL tablet Place 1 tablet (0.4 mg total) under the tongue every 5 (five) minutes as needed. 25 tablet 6  . traMADol (ULTRAM) 50 MG tablet Take 1 tablet by mouth 2 (two) times daily as needed.     No current facility-administered medications for this visit.     REVIEW OF SYSTEMS:   [X]  denotes positive finding, [ ]  denotes negative finding Cardiac  Comments:  Chest pain or chest pressure:    Shortness of breath upon exertion:    Short of breath when lying flat:    Irregular heart rhythm:        Vascular    Pain in calf, thigh, or hip brought on by ambulation: x   Pain in feet at night that wakes you up from your sleep:     Blood clot in your veins:    Leg swelling:         Pulmonary    Oxygen at home:    Productive cough:     Wheezing:         Neurologic    Sudden weakness in arms or legs:     Sudden numbness in arms or legs:  x   Sudden onset of difficulty speaking or slurred speech:    Temporary loss of vision in one eye:     Problems with dizziness:         Gastrointestinal    Blood in stool:      Vomited blood:         Genitourinary    Burning when urinating:     Blood in urine:        Psychiatric    Major depression:         Hematologic    Bleeding problems:    Problems with blood clotting too easily:        Skin    Rashes or ulcers:        Constitutional    Fever or chills:  PHYSICAL EXAM:   Vitals:   06/12/17 0951 06/12/17 0954  BP: 116/71 112/71  Pulse: 66   Resp: 20   SpO2: 96%   Weight: 180 lb 9.6 oz (81.9 kg)   Height: 5\' 7"  (1.702 m)     GENERAL: The patient  is a well-nourished male, in no acute distress. The vital signs are documented above. CARDIAC: There is a regular rate and rhythm.  VASCULAR: Palpable pedal pulses.  No carotid bruits. PULMONARY: Nonlabored respirations ABDOMEN: Soft and non-tender with normal pitched bowel sounds.  MUSCULOSKELETAL: There are no major deformities or cyanosis. NEUROLOGIC: No focal weakness or paresthesias are detected. SKIN: There are no ulcers or rashes noted. PSYCHIATRIC: The patient has a normal affect.  STUDIES:   I have reviewed his outside ultrasound which shows 50-69% right carotid stenosis and less than 50% left carotid stenosis  ASSESSMENT and PLAN   I discussed with the patient that his right sided symptoms would be associated with a left carotid lesion.  His left carotid stenosis is less than 50%.  We discussed the indications for intervention would be greater than 80% if he were asymptomatic, and greater than 50% if he was symptomatic.  He does not meet these criteria at this time, and therefore no surgical intervention is recommended.  He is already on maximum medical therapy.  He could improve his diabetes but he has been working at this.  He understands to go to the emergency department immediately should he develop new symptoms.  I have him scheduled to follow-up with me in 1 year with a carotid duplex.   Durene Cal, MD [facility] [phone] Pager [phone]

## 2017-06-19 ENCOUNTER — Other Ambulatory Visit: Payer: Self-pay | Admitting: Cardiology

## 2017-06-20 NOTE — Telephone Encounter (Signed)
Rx has been sent to the pharmacy electronically. ° °

## 2017-09-05 ENCOUNTER — Other Ambulatory Visit: Payer: Self-pay | Admitting: Cardiology

## 2018-06-25 ENCOUNTER — Ambulatory Visit: Payer: Managed Care, Other (non HMO) | Admitting: Family

## 2018-06-25 ENCOUNTER — Encounter (HOSPITAL_COMMUNITY): Payer: Managed Care, Other (non HMO)

## 2018-08-06 ENCOUNTER — Other Ambulatory Visit: Payer: Self-pay | Admitting: Cardiology

## 2018-08-08 ENCOUNTER — Other Ambulatory Visit: Payer: Self-pay

## 2018-08-08 MED ORDER — ISOSORBIDE MONONITRATE ER 60 MG PO TB24: 60.0000 mg | ORAL_TABLET | Freq: Every evening | ORAL | 8 refills | Status: DC

## 2018-09-19 ENCOUNTER — Other Ambulatory Visit: Payer: Self-pay

## 2018-09-19 DIAGNOSIS — I6523 Occlusion and stenosis of bilateral carotid arteries: Secondary | ICD-10-CM

## 2018-09-21 ENCOUNTER — Telehealth (HOSPITAL_COMMUNITY): Payer: Self-pay

## 2018-09-21 NOTE — Telephone Encounter (Signed)
The above patient or their representative was contacted and gave the following answers to these questions:         Do you have any of the following symptoms?  no  Fever                    Cough                   Shortness of breath  Do  you have any of the following other symptoms? no   muscle pain         vomiting,        diarrhea        rash         weakness        red eye        abdominal pain         bruising          bruising or bleeding              joint pain           severe headache    Have you been in contact with someone who was or has been sick in the past 2 weeks?  no  Yes                 Unsure                         Unable to assess   Does the person that you were in contact with have any of the following symptoms?   Cough         shortness of breath           muscle pain         vomiting,            diarrhea            rash            weakness           fever            red eye           abdominal pain           bruising  or  bleeding                joint pain                severe headache               Have you  or someone you have been in contact with traveled internationally in th last month?   no      If yes, which countries?   Have you  or someone you have been in contact with traveled outside [place] in th last month?   no      If yes, which state and city?   COMMENTS OR ACTION PLAN FOR THIS PATIENT:          

## 2018-09-24 ENCOUNTER — Ambulatory Visit (HOSPITAL_COMMUNITY)
Admission: RE | Admit: 2018-09-24 | Discharge: 2018-09-24 | Disposition: A | Discharge status: Home / Self Care | Payer: Managed Care, Other (non HMO) | Source: Ambulatory Visit | Attending: Family | Admitting: Family

## 2018-09-24 ENCOUNTER — Other Ambulatory Visit: Payer: Self-pay

## 2018-09-24 ENCOUNTER — Encounter: Payer: Self-pay | Admitting: Family

## 2018-09-24 ENCOUNTER — Ambulatory Visit: Payer: Managed Care, Other (non HMO) | Admitting: Family

## 2018-09-24 VITALS — BP 120/72 | HR 64 | Temp 98.9°F | Resp 18 | Ht 67.0 in | Wt 180.1 lb

## 2018-09-24 DIAGNOSIS — Z87891 Personal history of nicotine dependence: Secondary | ICD-10-CM

## 2018-09-24 DIAGNOSIS — Z8673 Personal history of transient ischemic attack (TIA), and cerebral infarction without residual deficits: Secondary | ICD-10-CM

## 2018-09-24 DIAGNOSIS — I6523 Occlusion and stenosis of bilateral carotid arteries: Secondary | ICD-10-CM | POA: Insufficient documentation

## 2018-09-24 NOTE — Progress Notes (Signed)
Chief Complaint: Follow up Extracranial Carotid Artery Stenosis   History of Present Illness  Mario Arnold is a 63 y.o. male who has known carotid stenosis.  He had a TIA in about 2010 as manifested by right arm weakness and inability to articulate words. He denies any resiidual neurological deficits.    He denies claudication type symptoms with walking. He does report right sciatica. He states that he receives what sounds like ESI's; states he feels benefits for about 2 months from these.   Dr. Trula Slade last evaluated pt on 06-12-17. At that time he discussed with the patient that his right sided symptoms would be associated with a left carotid lesion.  His left carotid stenosis is less than 50%. He discussed the indications for intervention would be greater than 80% if he were asymptomatic, and greater than 50% if he was symptomatic.  He did not meet these criteria at that time, and therefore no surgical intervention was recommended.  He was already on maximum medical therapy.  He could improve his diabetes but he has been working at this.  He understands to go to the emergency department immediately should he develop new symptoms. Pt was to follow-up in 1 year with a carotid duplex.  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   Diabetic: yes, 7.5 was his last A1C result on file from 2018; he does not recall his last A1C, and states he does not check his home glucose very much, but states he is trying to  make dietary improvement.  Tobacco use: former smoker, quit in 1999, smoked 1.5 ppd x 23 years   Pt meds include: Statin : yes ASA: yes Other anticoagulants/antiplatelets: no   Past Medical History:  Diagnosis Date  . Arthritis   . Carotid disease, bilateral ([place]) June 2013   less than 50% stenosis in the right and left   . Chronic kidney disease 2017   stones  . Coronary artery disease    STATUS POST ANGIOPLASTY  OF THE LAD   . Diabetes mellitus    TYPE 2  . Dyslipidemia   . History of transient ischemic attack (TIA)    on aggrenox  . Hyperlipidemia   . Myocardial infarction [facility]) 1999   stent placement- per patient has 2 stents  . Stroke ([place])   . Thyroid nodule June 2013   had [place] with 4 mm small cystic nodule noted. Needs follow up US in 12 months. TSH is normal.     Social History Social History   Tobacco Use  . Smoking status: Former Smoker    Packs/day: 1.50    Years: 23.00    Pack years: 34.50    Types: Cigarettes    Quit date: 03/28/1997    Years since quitting: 21.5  . Smokeless tobacco: Former Systems developer    Types: Snuff, Chew  . Tobacco comment: as a teen  Substance Use Topics  . Alcohol use: Yes    Comment: socially  . Drug use: No    Family History Family History  Problem Relation Age of Onset  . Coronary artery disease Mother   . Heart attack Mother   . Diabetes Mother   . Transient ischemic attack Mother   . Heart disease Mother   . Cancer Father     Surgical History Past Surgical History:  Procedure Laterality Date  . CORONARY ANGIOPLASTY WITH STENT PLACEMENT  2003   LAD. EJECTION  FRACTION IS 45%  . SHOULDER ARTHROSCOPY WITH OPEN ROTATOR CUFF REPAIR AND DISTAL CLAVICLE ACROMINECTOMY Right 09/05/2012   Procedure: RIGHT SHOULDER ARTHROSCOPY WITH LABRAL DEBRIDEMENT AND OPEN DISTAL CLAVICLE RESECTION, acromionectomy  AND OPEN ROTATOR CUFF REPAIR;  Surgeon: Drucilla SchmidtJames P Aplington, MD;  Location: [facility];  Service: Orthopedics;  Laterality: Right;    Allergies  Allergen Reactions  . Hydromorphone Nausea And Vomiting    Current Outpatient Medications  Medication Sig Dispense Refill  . acetaminophen (TYLENOL) 500 MG tablet Take 1,000 mg by mouth as needed for mild pain.     [name]. atorvastatin (LIPITOR) 80 MG tablet Take 80 mg by mouth daily.    [name]. dipyridamole-aspirin (AGGRENOX) 200-25 MG 12hr capsule Take 1 capsule by mouth 2 (two) times daily.    [name]. ezetimibe (ZETIA) 10 MG  tablet Take 10 mg by mouth every evening.    . fenofibrate 160 MG tablet TAKE 1 TABLET BY MOUTH ONCE DAILY IN THE EVENING 90 tablet 3  . glyBURIDE (DIABETA) 5 MG tablet Take 10 mg by mouth 2 (two) times daily with a meal.     . isosorbide mononitrate (IMDUR) 60 MG 24 hr tablet Take 1 tablet (60 mg total) by mouth every evening. 30 tablet 8  . JARDIANCE 25 MG TABS tablet Take 1 tablet by mouth daily.    [name]. losartan (COZAAR) 25 MG tablet Take 25 mg by mouth daily.    . metFORMIN (GLUCOPHAGE) 500 MG tablet Take 500 mg by mouth 4 (four) times daily. Two in am and 2 in pm    . metoprolol tartrate (LOPRESSOR) 25 MG tablet Take 25 mg by mouth 2 (two) times daily.     . nitroGLYCERIN (NITROSTAT) 0.4 MG SL tablet Place 1 tablet (0.4 mg total) under the tongue every 5 (five) minutes as needed. 25 tablet 6  . traMADol (ULTRAM) 50 MG tablet Take 1 tablet by mouth 2 (two) times daily as needed.     No current facility-administered medications for this visit.     Review of Systems : See HPI for pertinent positives and negatives.  Physical Examination  Vitals:   09/24/18 0854 09/24/18 0858  BP: 120/73 120/72  Pulse: 64   Resp: 18   Temp: 98.9 F (37.2 C)   SpO2: 97%   Weight: 180 lb 1.6 oz (81.7 kg)   Height: 5\' 7"  (1.702 m)    Body mass index is 28.21 kg/m.  General: WDWN muscular fit appearing male in NAD GAIT: normal Eyes: PERRLA HENT: No gross abnormalities.  Pulmonary:  Respirations are non-labored, good air movement in all fields, CTAB, no rales,  rhonchi, or wheezing. Cardiac: regular rhythm, no detected murmur.  VASCULAR EXAM Carotid Bruits Right Left   Negative Negative     Abdominal aortic pulse is not palpable. Radial pulses are 2+ palpable and equal.  LE Pulses Right Left       POPLITEAL  1+ palpable   1+ palpable       POSTERIOR TIBIAL  2+ palpable    2+ palpable        DORSALIS PEDIS      ANTERIOR TIBIAL 1+ palpable  1+ palpable     Gastrointestinal: soft, nontender, BS WNL, no r/g, no palpable masses. Musculoskeletal: no muscle atrophy/wasting. M/S 5/5 throughout, extremities without ischemic changes. Skin: No rashes, no ulcers, no cellulitis.   Neurologic:  A&O X 3; appropriate affect, sensation is normal; speech is normal, CN 2-12 intact, pain and light touch intact in extremities, motor exam as listed above. Bilateral leg raise against resistance elicits low back pain.  Psychiatric: Normal thought content, mood appropriate to clinical situation.    Assessment: Kandis CockingBuddy L Kauffmann is a 63 y.o. male who had a TIA in about 2010 as manifested by right arm weakness and inability to articulate words. No subsequent neurological events.  No resiidual neurological deficits.   Carotid duplex today shows 40-59% right ICA stenosis and 1-39% left ICA stenosis. No significant change compared to the exam on 05-24-17.   His atherosclerotic risk factors include DM, 23 year hx of smoking (quit in 1999), and CAD.  He takes a daily statin and ASA.  His jobs involves hard physical labor.   DATA Carotid Duplex (09-24-18): Right Carotid: Velocities in the right ICA are consistent with a 40-59%stenosis. Non-hemodynamically significant plaque <50% noted in the CCA. Left Carotid: Velocities in the left ICA are consistent with a 1-39% stenosis. Non-hemodynamically significant plaque noted in the CCA. Vertebrals:  Bilateral vertebral arteries demonstrate antegrade flow. Subclavians: Normal flow hemodynamics were seen in bilateral subclavian arteries. No significant change compared to the exam on 05-24-17.   Plan: Follow-up in 1 year with Carotid Duplex scan.   I discussed in depth with the patient the nature of atherosclerosis, and emphasized the importance of maximal medical management including strict control of blood pressure, blood glucose, and lipid  levels, obtaining regular exercise, and continued cessation of smoking.  The patient is aware that without maximal medical management the underlying atherosclerotic disease process will progress, limiting the benefit of any interventions. The patient was given information about stroke prevention and what symptoms should prompt the patient to seek immediate medical care. Thank you for allowing us to participate in this patient's care.  Charisse MarchSuzanne Nickel, RN, MSN, FNP-C [facility] [place] Office: [phone]  Clinic Physician: Myra GianottiBrabham  09/24/18 9:47 AM

## 2018-09-24 NOTE — Patient Instructions (Addendum)

## 2018-11-07 ENCOUNTER — Other Ambulatory Visit: Payer: Self-pay | Admitting: Cardiology

## 2019-01-22 ENCOUNTER — Other Ambulatory Visit: Payer: Self-pay

## 2019-01-22 DIAGNOSIS — Z20822 Contact with and (suspected) exposure to covid-19: Secondary | ICD-10-CM

## 2019-01-24 LAB — NOVEL CORONAVIRUS, NAA: SARS-CoV-2, NAA: DETECTED — AB

## 2019-05-02 ENCOUNTER — Other Ambulatory Visit: Payer: Self-pay | Admitting: Cardiology

## 2019-05-29 ENCOUNTER — Encounter: Payer: Self-pay | Admitting: Cardiology

## 2019-06-30 ENCOUNTER — Other Ambulatory Visit: Payer: Self-pay | Admitting: Cardiology

## 2019-07-03 ENCOUNTER — Other Ambulatory Visit: Payer: Self-pay | Admitting: Cardiology

## 2019-07-09 NOTE — Progress Notes (Signed)
Mario Arnold Date of Birth: 01-21-1956 Medical Record #[id number]  History of Present Illness: Mario Arnold is seen for follow up CAD.  Has known CAD with  remote PCI of the LAD in 2003. Last cath in 2013 following abnormal Myoview showed moderate 2 vessel disease with a 50% in stent stenosis of the proximal LAD and a 70% lesion in an intermediate branch. EF of 50% - managed medically. Other issues include  thyroid nodule with normal TSH, prior TIA in 2013, DM, HTN and HLD.   He was seen in June 2018 with symptoms of increased dyspnea on exertion. Myoview was repeated and felt to be low risk. Last seen in Feb 2019. Carotid dopplers in June 2020 showed 40-59% right ICA stenosis. Followed by Dr Trula Slade. States he had Covid 19 back in October.   On follow up today he is doing well. He has no angina, dyspnea, palpitations, edema. He is still working but thinks he may need to quit this year due to chronic back problems.   Current Outpatient Medications  Medication Sig Dispense Refill  . acetaminophen (TYLENOL) 500 MG tablet Take 1,000 mg by mouth as needed for mild pain.     [name] atorvastatin (LIPITOR) 80 MG tablet Take 80 mg by mouth daily.    [name] dipyridamole-aspirin (AGGRENOX) 200-25 MG 12hr capsule Take 1 capsule by mouth 2 (two) times daily.    [name] ezetimibe (ZETIA) 10 MG tablet Take 10 mg by mouth every evening.    . fenofibrate 160 MG tablet Take 1 tablet (160 mg total) by mouth daily. Please keep upcoming appt for refills. Thank you 30 tablet 0  . glyBURIDE (DIABETA) 5 MG tablet Take 10 mg by mouth 2 (two) times daily with a meal.     . isosorbide mononitrate (IMDUR) 60 MG 24 hr tablet Take 1 tablet by mouth in the evening 30 tablet 3  . JARDIANCE 25 MG TABS tablet Take 1 tablet by mouth daily.    [name] losartan (COZAAR) 25 MG tablet Take 25 mg by mouth daily.    . metFORMIN (GLUCOPHAGE) 500 MG tablet Take 500 mg by mouth 4 (four) times daily. Two in am and 2 in pm    . metoprolol tartrate (LOPRESSOR) 25 MG  tablet Take 25 mg by mouth 2 (two) times daily.     . nitroGLYCERIN (NITROSTAT) 0.4 MG SL tablet Place 1 tablet (0.4 mg total) under the tongue every 5 (five) minutes as needed. 25 tablet 6  . HYDROcodone-acetaminophen (NORCO) 10-325 MG tablet Take 1 tablet by mouth every 6 (six) hours as needed. for pain     No current facility-administered medications for this visit.    Allergies  Allergen Reactions  . Hydromorphone Nausea And Vomiting    Past Medical History:  Diagnosis Date  . Arthritis   . Carotid disease, bilateral ([place]) June 2013   less than 50% stenosis in the right and left   . Chronic kidney disease 2017   stones  . Coronary artery disease    STATUS POST ANGIOPLASTY OF THE LAD   . Diabetes mellitus    TYPE 2  . Dyslipidemia   . History of transient ischemic attack (TIA)    on aggrenox  . Hyperlipidemia   . Myocardial infarction [facility]) 1999   stent placement- per patient has 2 stents  . Stroke ([place])   . Thyroid nodule June 2013   had [place] with 4 mm small cystic nodule noted. Needs follow up US in 12  months. TSH is normal.     Past Surgical History:  Procedure Laterality Date  . CORONARY ANGIOPLASTY WITH STENT PLACEMENT  2003   LAD. EJECTION FRACTION IS 45%  . SHOULDER ARTHROSCOPY WITH OPEN ROTATOR CUFF REPAIR AND DISTAL CLAVICLE ACROMINECTOMY Right 09/05/2012   Procedure: RIGHT SHOULDER ARTHROSCOPY WITH LABRAL DEBRIDEMENT AND OPEN DISTAL CLAVICLE RESECTION, acromionectomy  AND OPEN ROTATOR CUFF REPAIR;  Surgeon: Drucilla Schmidt, MD;  Location: [facility];  Service: Orthopedics;  Laterality: Right;    Social History   Tobacco Use  Smoking Status Former Smoker  . Packs/day: 1.50  . Years: 23.00  . Pack years: 34.50  . Types: Cigarettes  . Quit date: 03/28/1997  . Years since quitting: 22.3  Smokeless Tobacco Former Neurosurgeon  . Types: Snuff, Chew  Tobacco Comment   as a teen    Social History   Substance and Sexual Activity  Alcohol Use Yes   Comment: socially      Family History  Problem Relation Age of Onset  . Coronary artery disease Mother   . Heart attack Mother   . Diabetes Mother   . Transient ischemic attack Mother   . Heart disease Mother   . Cancer Father     Review of Systems: The review of systems is per the HPI.  All other systems were reviewed and are negative.  Physical Exam: BP 102/62   Pulse (!) 50   Ht 5\' 7"  (1.702 m)   Wt 174 lb 6.4 oz (79.1 kg)   SpO2 95%   BMI 27.31 kg/m  GENERAL:  Well appearing WM in NAD HEENT:  PERRL, EOMI, sclera are clear. Oropharynx is clear. NECK:  No jugular venous distention, carotid upstroke brisk and symmetric, no bruits, no thyromegaly or adenopathy LUNGS:  Clear to auscultation bilaterally CHEST:  Unremarkable HEART:  RRR,  PMI not displaced or sustained,S1 and S2 within normal limits, no S3, no S4: no clicks, no rubs, no murmurs ABD:  Soft, nontender. BS +, no masses or bruits. No hepatomegaly, no splenomegaly EXT:  2 + pulses throughout, no edema, no cyanosis no clubbing SKIN:  Warm and dry.  No rashes NEURO:  Alert and oriented x 3. Cranial nerves II through XII intact. PSYCH:  Cognitively intact    LABORATORY DATA:    Lab Results  Component Value Date   WBC 6.0 05/07/2013   HGB 14.8 05/07/2013   HCT 44.9 05/07/2013   PLT 202.0 05/07/2013   GLUCOSE 184 (H) 05/07/2013   CHOL 115 05/07/2013   TRIG 235.0 (H) 05/07/2013   HDL 29.30 (L) 05/07/2013   LDLDIRECT 55.5 05/07/2013   ALT 37 05/07/2013   AST 26 05/07/2013   NA 137 05/07/2013   K 4.1 05/07/2013   CL 107 05/07/2013   CREATININE 1.3 05/07/2013   BUN 20 05/07/2013   CO2 23 05/07/2013   TSH 2.71 09/13/2011   INR 1.0 08/24/2011   HGBA1C 10.8 (H) 05/17/2013   Carotid dopplers June 2020: 40-59% RICA stenosis.   Labs reviewed from primary care on 12/04/15: cholesterol 139, triglycerides 628, LDL 40, HDL 24. A1c 12. Glucose 250. BUN 19, creatinine 1.23. Other chemistries and CBC normal.  Dated 09/23/16: glucose  237, creatinine 1.3. A1c 10.4. Cholesterol 110, triglycerides 124. HDL 27, LDL 59.  Dated 04/21/17: A1c 7.5. Creatinine 1.39. Other chemistries normal. Dated 05/29/19: cholesterol 133, triglycerides 190, HDL 29, LDL 72. A1c 7.7%. creatinine 1.31. glucose 500. Bicarb 18. Other chemistries normal. CBC and TSH normal.  Ecg today shows NSR rate 50 old septal infarct. I have personally reviewed and interpreted this study.   Myoview 09/30/16: Study Highlights    Clincally negative, electrically positive for ischemia Excellent exercise tolerance  Basal inferior, inferolateral defect that normalizes in recovery period. May reflect soft tissue attenuation, cannot exclude very mild ischemia  LVEF 50%  Low to intermediate risk study    Assessment / Plan: 1. CAD - remote PCI of the LAD - known 2 vessel CAD with 50% ISR of the LAD and 70% intermediate lesion in 2013 - managed medically. Low risk Myoview in July 2018. He remains asymptomatic.  Continue with CV risk factor modification.   2. HTN - BP well controlled.   3. HLD - good control on Lipitor,  Zetia   4. DM type 2. On metformin and Jardiance. A1c 7.7%. trying to work on diet more.   5. CKD stage 3 followed by Dr Arrie Aran.  Will continue current therapy. Follow up in one year

## 2019-07-11 ENCOUNTER — Ambulatory Visit (INDEPENDENT_AMBULATORY_CARE_PROVIDER_SITE_OTHER): Payer: Managed Care, Other (non HMO) | Admitting: Cardiology

## 2019-07-11 ENCOUNTER — Other Ambulatory Visit: Payer: Self-pay

## 2019-07-11 ENCOUNTER — Encounter: Payer: Self-pay | Admitting: Cardiology

## 2019-07-11 ENCOUNTER — Ambulatory Visit: Payer: Managed Care, Other (non HMO) | Admitting: Cardiology

## 2019-07-11 VITALS — BP 102/62 | HR 50 | Ht 67.0 in | Wt 174.4 lb

## 2019-07-11 DIAGNOSIS — E7800 Pure hypercholesterolemia, unspecified: Secondary | ICD-10-CM

## 2019-07-11 DIAGNOSIS — I6523 Occlusion and stenosis of bilateral carotid arteries: Secondary | ICD-10-CM | POA: Diagnosis not present

## 2019-07-11 DIAGNOSIS — I25118 Atherosclerotic heart disease of native coronary artery with other forms of angina pectoris: Secondary | ICD-10-CM

## 2019-07-11 DIAGNOSIS — I10 Essential (primary) hypertension: Secondary | ICD-10-CM

## 2019-07-22 ENCOUNTER — Telehealth: Payer: Self-pay | Admitting: *Deleted

## 2019-07-22 NOTE — Telephone Encounter (Signed)
   [facility] Pre-operative Risk Assessment    Request for surgical clearance:  1. What type of surgery is being performed? LUMBAR TRANSLAMINAR EPIDURAL STEROID INJECTION    2. When is this surgery scheduled? TBD    3. What type of clearance is required (medical clearance vs. Pharmacy clearance to hold med vs. Both)?   4. Are there any medications that need to be held prior to surgery and how long?AGGRENOX   5. Practice name and name of physician performing surgery? DR [facility]   6. What is your office phone number?     7.   What is your office fax number? [phone]  8.   Anesthesia type (None, local, MAC, general) ? IV SEDATION    Alvina Filbert 07/22/2019, 5:38 PM  _________________________________________________________________   (provider comments below)

## 2019-07-23 NOTE — Telephone Encounter (Signed)
He can hold aggrenox for 5 days prior to epidural injection  Kaulder Zahner [place] MD, [facility]

## 2019-07-23 NOTE — Telephone Encounter (Signed)
   Primary Cardiologist: Peter Sw[place]iland, MD  Chart reviewed as part of pre-operative protocol coverage. Patient was doing well when seen by Dr. Sw[place]iland 07/11/19. Given past medical history and time since last visit, based on ACC/AHA guidelines, Mario Arnold would be at acceptable risk for the planned procedure without further cardiovascular testing.   Hx of CAD s/p remote PCI of the LAD with  known 2 vessel CAD with 50% ISR of the LAD and 70% intermediate lesion in 2013 - managed medically. Low risk Myoview in July 2018. Also hx of TIA in 2013 and mild carotid artery disease.   Dr. [place], Please give your recommendation on Aggrenox. Please forward your response to P CV DIV PREOP.   Thank you  Manson Passey, PA 07/23/2019, 3:36 PM

## 2019-07-24 NOTE — Telephone Encounter (Signed)
   Primary Cardiologist: Peter [place], MD  Chart reviewed as part of pre-operative protocol coverage. Given past medical history and time since last visit, based on ACC/AHA guidelines, Mario Arnold would be at acceptable risk for the planned procedure without further cardiovascular testing.   OK to hold Aggrenox 5 days pre op.   I will route this recommendation to the requesting party via Epic fax function and remove from pre-op pool.  Please call with questions.  Corine Shelter, PA-C 07/24/2019, 11:27 AM

## 2019-09-24 ENCOUNTER — Other Ambulatory Visit: Payer: Self-pay | Admitting: Cardiology

## 2019-11-25 ENCOUNTER — Other Ambulatory Visit: Payer: Self-pay | Admitting: Cardiology

## 2019-12-04 ENCOUNTER — Emergency Department (HOSPITAL_COMMUNITY)
Admission: EM | Admit: 2019-12-04 | Discharge: 2019-12-04 | Disposition: A | Discharge status: Home / Self Care | Payer: Self-pay | Attending: Emergency Medicine | Admitting: Emergency Medicine

## 2019-12-04 ENCOUNTER — Encounter (HOSPITAL_COMMUNITY): Payer: Self-pay

## 2019-12-04 ENCOUNTER — Emergency Department (HOSPITAL_COMMUNITY): Payer: Self-pay

## 2019-12-04 ENCOUNTER — Other Ambulatory Visit: Payer: Self-pay

## 2019-12-04 DIAGNOSIS — Z7984 Long term (current) use of oral hypoglycemic drugs: Secondary | ICD-10-CM | POA: Insufficient documentation

## 2019-12-04 DIAGNOSIS — Z79899 Other long term (current) drug therapy: Secondary | ICD-10-CM | POA: Insufficient documentation

## 2019-12-04 DIAGNOSIS — G510 Bell's palsy: Secondary | ICD-10-CM | POA: Insufficient documentation

## 2019-12-04 DIAGNOSIS — Z87891 Personal history of nicotine dependence: Secondary | ICD-10-CM | POA: Insufficient documentation

## 2019-12-04 DIAGNOSIS — I2510 Atherosclerotic heart disease of native coronary artery without angina pectoris: Secondary | ICD-10-CM | POA: Insufficient documentation

## 2019-12-04 DIAGNOSIS — E119 Type 2 diabetes mellitus without complications: Secondary | ICD-10-CM | POA: Insufficient documentation

## 2019-12-04 LAB — DIFFERENTIAL
Abs Immature Granulocytes: 0.02 10*3/uL (ref 0.00–0.07)
Basophils Absolute: 0 10*3/uL (ref 0.0–0.1)
Basophils Relative: 1 %
Eosinophils Absolute: 0.2 10*3/uL (ref 0.0–0.5)
Eosinophils Relative: 3 %
Immature Granulocytes: 0 %
Lymphocytes Relative: 30 %
Lymphs Abs: 1.6 10*3/uL (ref 0.7–4.0)
Monocytes Absolute: 0.6 10*3/uL (ref 0.1–1.0)
Monocytes Relative: 10 %
Neutro Abs: 3 10*3/uL (ref 1.7–7.7)
Neutrophils Relative %: 56 %

## 2019-12-04 LAB — CBC
HCT: 50.9 % (ref 39.0–52.0)
Hemoglobin: 16.1 g/dL (ref 13.0–17.0)
MCH: 28.4 pg (ref 26.0–34.0)
MCHC: 31.6 g/dL (ref 30.0–36.0)
MCV: 89.8 fL (ref 80.0–100.0)
Platelets: 191 10*3/uL (ref 150–400)
RBC: 5.67 MIL/uL (ref 4.22–5.81)
RDW: 13 % (ref 11.5–15.5)
WBC: 5.4 10*3/uL (ref 4.0–10.5)
nRBC: 0 % (ref 0.0–0.2)

## 2019-12-04 LAB — COMPREHENSIVE METABOLIC PANEL
ALT: 34 U/L (ref 0–44)
AST: 25 U/L (ref 15–41)
Albumin: 4.1 g/dL (ref 3.5–5.0)
Alkaline Phosphatase: 59 U/L (ref 38–126)
Anion gap: 10 (ref 5–15)
BUN: 20 mg/dL (ref 8–23)
CO2: 19 mmol/L — ABNORMAL LOW (ref 22–32)
Calcium: 9.7 mg/dL (ref 8.9–10.3)
Chloride: 106 mmol/L (ref 98–111)
Creatinine, Ser: 1.22 mg/dL (ref 0.61–1.24)
GFR calc Af Amer: >60 mL/min (ref >60)
GFR calc non Af Amer: >60 mL/min (ref >60)
Glucose, Bld: 146 mg/dL — ABNORMAL HIGH (ref 70–99)
Potassium: 4.1 mmol/L (ref 3.5–5.1)
Sodium: 135 mmol/L (ref 135–145)
Total Bilirubin: 0.6 mg/dL (ref 0.3–1.2)
Total Protein: 6.9 g/dL (ref 6.5–8.1)

## 2019-12-04 LAB — I-STAT CHEM 8, ED
BUN: 25 mg/dL — ABNORMAL HIGH (ref 8–23)
Calcium, Ion: 1.24 mmol/L (ref 1.15–1.40)
Chloride: 108 mmol/L (ref 98–111)
Creatinine, Ser: 1.2 mg/dL (ref 0.61–1.24)
Glucose, Bld: 139 mg/dL — ABNORMAL HIGH (ref 70–99)
HCT: 50 % (ref 39.0–52.0)
Hemoglobin: 17 g/dL (ref 13.0–17.0)
Potassium: 4.2 mmol/L (ref 3.5–5.1)
Sodium: 139 mmol/L (ref 135–145)
TCO2: 21 mmol/L — ABNORMAL LOW (ref 22–32)

## 2019-12-04 LAB — PROTIME-INR
INR: 0.9 (ref 0.8–1.2)
Prothrombin Time: 11.8 seconds (ref 11.4–15.2)

## 2019-12-04 LAB — APTT: aPTT: 29 seconds (ref 24–36)

## 2019-12-04 MED ORDER — SYSTANE NIGHTTIME OP OINT: 1.0000 "application " | TOPICAL_OINTMENT | Freq: Every evening | OPHTHALMIC | 0 refills | Status: AC

## 2019-12-04 MED ORDER — ACYCLOVIR 400 MG PO TABS: 400.0000 mg | ORAL_TABLET | Freq: Every day | ORAL | 0 refills | Status: AC

## 2019-12-04 MED ORDER — SODIUM CHLORIDE 0.9% FLUSH: 3.0000 mL | Freq: Once | INTRAVENOUS | Status: DC

## 2019-12-04 MED ORDER — PREDNISONE 20 MG PO TABS: 40.0000 mg | ORAL_TABLET | Freq: Every day | ORAL | 0 refills | Status: AC

## 2019-12-04 MED ORDER — TETRAHYDROZOLINE HCL 0.05 % OP SOLN: 1.0000 [drp] | OPHTHALMIC | 0 refills | Status: AC

## 2019-12-04 NOTE — ED Notes (Signed)
Patient verbalizes understanding of discharge instructions. Opportunity for questioning and answers were provided. Armband removed by staff, pt discharged from ED and ambulated to lobby to return home with spouse.   

## 2019-12-04 NOTE — Discharge Instructions (Addendum)
For your eyes, please use Systane nighttime eye ointment every night before bed.  While awake, please use artificial tears every 1-2 hours.  Both of these can be purchased over-the-counter at a local pharmacy.  Please take the steroids and the antiviral medications as prescribed.  Strongly recommend follow-up with a local neurologist.  If at any point you develop a new neurologic symptoms such as blurry vision, difficulty speaking, confusion, difficulty walking, weakness, numbness, dizziness, return immediately to the emergency room for reassessment. If this occurs, you may need additional testing.   For follow up, I recommend seeing both your primary doctor and a neurologist. Please call both of them and schedule an appointment to be seen as soon as possible for close follow up.

## 2019-12-04 NOTE — ED Triage Notes (Signed)
Patient complains of left sided face and lip numbness x 2 days. No drift or weakness in arms or legs. Patient alert and oriented, unable to raise left eyebrow for same. Also complains of posterior headache

## 2019-12-04 NOTE — ED Provider Notes (Signed)
[facility] Provider Note   CSN: [id number] Arrival date & time: 12/04/19  [id number]     History No chief complaint on file.   Mario CockingBuddy L Arnold is a 64 y.o. male.  Presents to ER with concern for left facial droop. 2 days ago he noted some left face, left lower numb/tingling sensation and then noticed weakness in his left facial muscles.  Has noted difficulty closing his left eye and eyelids due to weakness.  He denies any vision changes, no speech changes, no other numbness or weakness elsewhere in his body.  no difficulty walking. No prior history of stroke.  no recent fevers, no recent ear infections, no rashes noted. Has had shingles vaccine.   HPI     Past Medical History:  Diagnosis Date  . Arthritis   . Carotid disease, bilateral ([facility]) June 2013   less than 50% stenosis in the right and left   . Chronic kidney disease 2017   stones  . Coronary artery disease    STATUS POST ANGIOPLASTY OF THE LAD   . Diabetes mellitus    TYPE 2  . Dyslipidemia   . History of transient ischemic attack (TIA)    on aggrenox  . Hyperlipidemia   . Myocardial infarction [facility] 1999   stent placement- per patient has 2 stents  . Stroke ([facility])   . Thyroid nodule June 2013   had US with 4 mm small cystic nodule noted. Needs follow up US in 12 months. TSH is normal.     Patient Active Problem List   Diagnosis Date Noted  . Carotid disease, bilateral ([facility]) 10/06/2011  . Thyroid nodule 10/06/2011  . Coronary artery disease   . Diabetes mellitus type 2 in nonobese ([facility])   . Dyslipidemia   . History of transient ischemic attack (TIA)     Past Surgical History:  Procedure Laterality Date  . CORONARY ANGIOPLASTY WITH STENT PLACEMENT  2003   LAD. EJECTION FRACTION IS 45%  . SHOULDER ARTHROSCOPY WITH OPEN ROTATOR CUFF REPAIR AND DISTAL CLAVICLE ACROMINECTOMY Right 09/05/2012   Procedure: RIGHT SHOULDER ARTHROSCOPY WITH LABRAL DEBRIDEMENT AND OPEN DISTAL CLAVICLE  RESECTION, acromionectomy  AND OPEN ROTATOR CUFF REPAIR;  Surgeon: Drucilla SchmidtJames P Aplington, MD;  Location: [facility];  Service: Orthopedics;  Laterality: Right;       Family History  Problem Relation Age of Onset  . Coronary artery disease Mother   . Heart attack Mother   . Diabetes Mother   . Transient ischemic attack Mother   . Heart disease Mother   . Cancer Father     Social History   Tobacco Use  . Smoking status: Former Smoker    Packs/day: 1.50    Years: 23.00    Pack years: 34.50    Types: Cigarettes    Quit date: 03/28/1997    Years since quitting: 22.7  . Smokeless tobacco: Former NeurosurgeonUser    Types: Snuff, Chew  . Tobacco comment: as a teen  Vaping Use  . Vaping Use: Never used  Substance Use Topics  . Alcohol use: Yes    Comment: socially  . Drug use: No    Home Medications Prior to Admission medications   Medication Sig Start Date End Date Taking? Authorizing Provider  acetaminophen (TYLENOL) 500 MG tablet Take 1,000 mg by mouth as needed for mild pain.     [provider]  acyclovir (ZOVIRAX) 400 MG tablet Take 1 tablet (400 mg total) by mouth 5 (  five) times daily for 10 days. 12/04/19 12/14/19  Milagros Loll, MD  atorvastatin (LIPITOR) 80 MG tablet Take 80 mg by mouth daily. 10/15/14   [provider]  dipyridamole-aspirin (AGGRENOX) 200-25 MG 12hr capsule Take 1 capsule by mouth 2 (two) times daily. 06/27/15   Jerilee Field, MD  ezetimibe (ZETIA) 10 MG tablet Take 10 mg by mouth every evening.    [provider]  fenofibrate 160 MG tablet TAKE 1 TABLET BY MOUTH ONCE DAILY(KEEP UP COMING APPT FOR REFILLS) 09/24/19   Marykay Lex, MD  glyBURIDE (DIABETA) 5 MG tablet Take 10 mg by mouth 2 (two) times daily with a meal.     [provider]  HYDROcodone-acetaminophen (NORCO) 10-325 MG tablet Take 1 tablet by mouth every 6 (six) hours as needed. for pain 07/01/19   [provider]  isosorbide mononitrate (IMDUR) 60 MG 24 hr  tablet TAKE 1 TABLET BY MOUTH IN THE EVENING(NEED OFFICE VISIT FOR FUTURE REFILLS) 11/25/19   [place], Peter M, MD  JARDIANCE 25 MG TABS tablet Take 1 tablet by mouth daily. 05/22/17   [provider]  losartan (COZAAR) 25 MG tablet Take 25 mg by mouth daily. 12/09/15   [provider]  metFORMIN (GLUCOPHAGE) 500 MG tablet Take 500 mg by mouth 4 (four) times daily. Two in am and 2 in pm    [provider]  metoprolol tartrate (LOPRESSOR) 25 MG tablet Take 25 mg by mouth 2 (two) times daily.     [provider]  nitroGLYCERIN (NITROSTAT) 0.4 MG SL tablet Place 1 tablet (0.4 mg total) under the tongue every 5 (five) minutes as needed. 05/07/13   Rosalio Macadamia, NP  predniSONE (DELTASONE) 20 MG tablet Take 2 tablets (40 mg total) by mouth daily for 5 days. 12/04/19 12/09/19  Milagros Loll, MD  tetrahydrozoline (VISINE) 0.05 % ophthalmic solution Place 1 drop into the left eye every 2 (two) hours while awake for 14 days. 12/04/19 12/18/19  Milagros Loll, MD  White Petrolatum-Mineral Oil (SYSTANE NIGHTTIME) OINT Apply 1 application to eye at bedtime for 14 days. 12/04/19 12/18/19  Milagros Loll, MD    Allergies    Hydromorphone  Review of Systems   Review of Systems  Constitutional: Negative for chills and fever.  HENT: Negative for ear pain and sore throat.   Eyes: Negative for pain and visual disturbance.  Respiratory: Negative for cough and shortness of breath.   Cardiovascular: Negative for chest pain and palpitations.  Gastrointestinal: Negative for abdominal pain and vomiting.  Genitourinary: Negative for dysuria and hematuria.  Musculoskeletal: Negative for arthralgias and back pain.  Skin: Negative for color change and rash.  Neurological: Positive for facial asymmetry. Negative for seizures and syncope.  All other systems reviewed and are negative.    Physical Exam Updated Vital Signs BP 130/88   Pulse 60   Temp 98 F (36.7 C)   Resp 15    SpO2 98%   Physical Exam Vitals and nursing note reviewed.  Constitutional:      Appearance: He is well-developed.  HENT:     Head: Normocephalic and atraumatic.     Right Ear: Tympanic membrane, ear canal and external ear normal.     Left Ear: Tympanic membrane, ear canal and external ear normal.     Nose: Nose normal.     Mouth/Throat:     Mouth: Mucous membranes are moist.  Eyes:     Conjunctiva/sclera: Conjunctivae normal.  Cardiovascular:     Rate and Rhythm: Normal rate and regular rhythm.     Heart sounds: No murmur heard.   Pulmonary:     Effort: Pulmonary effort is normal. No respiratory distress.     Breath sounds: Normal breath sounds.  Abdominal:     Palpations: Abdomen is soft.     Tenderness: There is no abdominal tenderness.  Musculoskeletal:     Cervical back: Normal range of motion and neck supple.  Skin:    General: Skin is warm and dry.     Findings: No lesion or rash.  Neurological:     Mental Status: He is alert.     Comments: AAOx3 CN 2-12 intact except patient has left facial droop with no forehead sparing Speech clear visual fields intact 5/5 strength in b/l UE and LE Sensation to light touch intact in b/l UE and LE Normal FNF Normal gait  Psychiatric:        Mood and Affect: Mood normal.        Behavior: Behavior normal.     ED Results / Procedures / Treatments   Labs (all labs ordered are listed, but only abnormal results are displayed) Labs Reviewed  COMPREHENSIVE METABOLIC PANEL - Abnormal; Notable for the following components:      Result Value   CO2 19 (*)    Glucose, Bld 146 (*)    All other components within normal limits  I-STAT CHEM 8, ED - Abnormal; Notable for the following components:   BUN 25 (*)    Glucose, Bld 139 (*)    TCO2 21 (*)    All other components within normal limits  PROTIME-INR  APTT  CBC  DIFFERENTIAL    EKG EKG Interpretation  Date/Time:  Wednesday December 04 2019 10:04:20 EDT Ventricular  Rate:  69 PR Interval:  154 QRS Duration: 86 QT Interval:  382 QTC Calculation: 409 R Axis:   -16 Text Interpretation: Normal sinus rhythm Low voltage QRS Septal infarct , age undetermined Abnormal ECG Confirmed by Marianna Fuss ([id number]) on 12/04/2019 3:41:22 PM   Radiology CT HEAD WO CONTRAST  Result Date: 12/04/2019 CLINICAL DATA:  Left-sided face and lip numbness for the past 2 days. EXAM: CT HEAD WITHOUT CONTRAST TECHNIQUE: Contiguous axial images were obtained from the base of the skull through the vertex without intravenous contrast. COMPARISON:  MRI brain and CT head dated June 02, 2006. FINDINGS: Brain: No evidence of acute infarction, hemorrhage, hydrocephalus, extra-axial collection or mass lesion/mass effect. Vascular: Calcified atherosclerosis at the skullbase. No hyperdense vessel. Skull: Normal. Negative for fracture or focal lesion. Sinuses/Orbits: No acute finding. Other: None. IMPRESSION: 1. No acute intracranial abnormality. Electronically Signed   By: Obie Dredge M.D.   On: 12/04/2019 11:41    Procedures Procedures (including critical care time)  Medications Ordered in ED Medications - No data to display  ED Course  I have reviewed the triage vital signs and the nursing notes.  Pertinent labs & imaging results that were available during my care of the patient were reviewed by me and considered in my medical decision making (see chart for details).    MDM Rules/Calculators/A&P                          64 year old male with left facial droop.  Physical exam consistent with Bell's palsy.  He has no other associated neurologic deficits.  There are no rashes, lesions noted on careful inspection of nose,  ears, face.  Will treat with course of steroids, antivirals and recommend outpatient neurology follow-up and outpatient PCP follow-up.  Reviewed eye care including recommendations for eyedrops during the day and eye ointment at night.  Reviewed strict return precautions  should patient develop any other neurologic complaints besides his isolated left facial droop.  Patient and wife demonstrated understanding and were discharged home.  After the discussed management above, the patient was determined to be safe for discharge.  The patient was in agreement with this plan and all questions regarding their care were answered.  ED return precautions were discussed and the patient will return to the ED with any significant worsening of condition.  Final Clinical Impression(s) / ED Diagnoses Final diagnoses:  Bell's palsy    Rx / DC Orders ED Discharge Orders         Ordered    tetrahydrozoline (VISINE) 0.05 % ophthalmic solution  Every 2 hours while awake        12/04/19 1616    White Petrolatum-Mineral Oil (SYSTANE NIGHTTIME) OINT  Nightly        12/04/19 1616    predniSONE (DELTASONE) 20 MG tablet  Daily        12/04/19 1616    acyclovir (ZOVIRAX) 400 MG tablet  5 times daily        12/04/19 1616           Milagros Loll, MD 12/05/19 1646

## 2020-07-06 ENCOUNTER — Telehealth: Payer: Self-pay | Admitting: Cardiology

## 2020-07-06 NOTE — Telephone Encounter (Signed)
4.11.22 LVM on cell to sch fu appt w/Dr Ronnie Derby

## 2020-08-21 ENCOUNTER — Other Ambulatory Visit: Payer: Self-pay | Admitting: Cardiology

## 2020-08-31 ENCOUNTER — Telehealth: Payer: Self-pay | Admitting: Cardiology

## 2020-08-31 NOTE — Telephone Encounter (Signed)
Pt c/o Shortness Of Breath: STAT if SOB developed within the last 24 hours or pt is noticeably SOB on the phone  1. Are you currently SOB (can you hear that pt is SOB on the phone)? no  2. How long have you been experiencing SOB? A month or so  3. Are you SOB when sitting or when up moving around? Moving around, active  4. Are you currently experiencing any other symptoms? No   Patient states he has been having SOB for a month or so. He states it happens whenever he is active and moving around. He states he "gives out real quick even walking". Patient was not SOB on the phone.

## 2020-08-31 NOTE — Telephone Encounter (Signed)
Called patient left message on personal voice mail to call back. 

## 2020-08-31 NOTE — Telephone Encounter (Signed)
Received a call back from patient.He stated for the past 1 to 2 months he has noticed he tires easy and is sob.No chest pain.Stated appointment scheduled in August with a PA.Stated he prefers to see Dr.Jordan.Appointment scheduled with Dr.Jordan 7/7 at 10:40 am.Advised I will make Dr.Jordan aware.

## 2020-09-14 ENCOUNTER — Ambulatory Visit: Payer: Self-pay | Admitting: Nurse Practitioner

## 2020-09-26 NOTE — Progress Notes (Signed)
Mario Arnold Date of Birth: [birth year] Medical Record #[id number]  History of Present Illness: Mario Arnold is seen for follow up CAD.  Has known CAD with  remote PCI of the LAD in 2003. Last cath in 2013 following abnormal Myoview showed moderate 2 vessel disease with a 50% in stent stenosis of the proximal LAD and a 70% lesion in an intermediate branch. EF of 50% - managed medically. Other issues include  thyroid nodule with normal TSH, prior TIA in 2013, DM, HTN and HLD.   He was seen in June 2018 with symptoms of increased dyspnea on exertion. Myoview was repeated and felt to be low risk. Last seen in Feb 2019. Carotid dopplers in June 2020 showed 40-59% right ICA stenosis. Followed by Dr Trula Slade. States he had Covid 19 back in October.   On follow up today he is complains of a lack of energy. Denies chest pain or dyspnea. Notes he ran out of medication due to insurance issues. Now has some new insurance and a new PCP. Just had labs checked yesterday and sugars poorly controlled with A1c 11.5% and triglycerides up to 743. Medications just resumed per PCP. He is limited by chronic back and leg pain.   Current Outpatient Medications  Medication Sig Dispense Refill   acetaminophen (TYLENOL) 500 MG tablet Take 1,000 mg by mouth as needed for mild pain.      dipyridamole-aspirin (AGGRENOX) 200-25 MG 12hr capsule Take 1 capsule by mouth 2 (two) times daily. 180 capsule 1   ezetimibe (ZETIA) 10 MG tablet Take 1 tablet (10 mg total) by mouth every evening. 90 tablet 1   fenofibrate 160 MG tablet Take 1 tablet (160 mg total) by mouth daily. 90 tablet 1   glyBURIDE (DIABETA) 5 MG tablet Take 2 tablets (10 mg total) by mouth 2 (two) times daily with a meal. 180 tablet 1   HYDROcodone-acetaminophen (NORCO) 10-325 MG tablet Take 1 tablet by mouth every 6 (six) hours as needed. for pain 21 tablet 0   isosorbide mononitrate (IMDUR) 60 MG 24 hr tablet TAKE 1 TABLET BY MOUTH IN THE EVENING(NEED OFFICE VISIT FOR  FUTURE REFILLS) 15 tablet 0   metoprolol tartrate (LOPRESSOR) 25 MG tablet Take 1 tablet (25 mg total) by mouth 2 (two) times daily. 180 tablet 1   pioglitazone (ACTOS) 15 MG tablet Take 1 tablet (15 mg total) by mouth daily. 90 tablet 1   atorvastatin (LIPITOR) 80 MG tablet Take 1 tablet (80 mg total) by mouth daily. (Patient not taking: Reported on 10/01/2020) 90 tablet 1   blood glucose meter kit and supplies Dispense based on patient and insurance preference. Use up to four times daily as directed. (FOR ICD-10 E10.9, E11.9). (Patient not taking: Reported on 10/01/2020) 1 each 0   JARDIANCE 25 MG TABS tablet Take 1 tablet (25 mg total) by mouth daily. (Patient not taking: Reported on 10/01/2020) 30 tablet 5   losartan (COZAAR) 25 MG tablet Take 1 tablet (25 mg total) by mouth daily. (Patient not taking: Reported on 10/01/2020) 90 tablet 1   metFORMIN (GLUCOPHAGE) 500 MG tablet Take 1 tablet (500 mg total) by mouth 4 (four) times daily. Two in am and 2 in pm (Patient not taking: Reported on 10/01/2020) 360 tablet 1   nitroGLYCERIN (NITROSTAT) 0.4 MG SL tablet Place 1 tablet (0.4 mg total) under the tongue every 5 (five) minutes as needed. 25 tablet 6   No current facility-administered medications for this visit.    Allergies  Allergen Reactions   Hydromorphone Nausea And Vomiting    Past Medical History:  Diagnosis Date   Arthritis    Carotid disease, bilateral (Carlos) June 2013   less than 50% stenosis in the right and left    Chronic kidney disease 2017   stones   Coronary artery disease    STATUS POST ANGIOPLASTY OF THE LAD    Diabetes mellitus    TYPE 2   Dyslipidemia    History of transient ischemic attack (TIA)    on aggrenox   Hyperlipidemia    Myocardial infarction [facility]) 1999   stent placement- per patient has 2 stents   Stroke [facility])    Thyroid nodule June 2013   had [place] with 4 mm small cystic nodule noted. Needs follow up US in 12 months. TSH is normal.     Past Surgical  History:  Procedure Laterality Date   CORONARY ANGIOPLASTY WITH STENT PLACEMENT  2003   LAD. EJECTION FRACTION IS 45%   SHOULDER ARTHROSCOPY WITH OPEN ROTATOR CUFF REPAIR AND DISTAL CLAVICLE ACROMINECTOMY Right 09/05/2012   Procedure: RIGHT SHOULDER ARTHROSCOPY WITH LABRAL DEBRIDEMENT AND OPEN DISTAL CLAVICLE RESECTION, acromionectomy  AND OPEN ROTATOR CUFF REPAIR;  Surgeon: Magnus Sinning, MD;  Location: [facility];  Service: Orthopedics;  Laterality: Right;    Social History   Tobacco Use  Smoking Status Former   Packs/day: 1.50   Years: 23.00   Pack years: 34.50   Types: Cigarettes   Quit date: 03/28/1997   Years since quitting: 23.5  Smokeless Tobacco Former   Types: Snuff, Chew  Tobacco Comments   as a teen    Social History   Substance and Sexual Activity  Alcohol Use Yes   Comment: socially    Family History  Problem Relation Age of Onset   Coronary artery disease Mother    Heart attack Mother    Diabetes Mother    Transient ischemic attack Mother    Heart disease Mother    Cancer Father     Review of Systems: The review of systems is per the HPI.  All other systems were reviewed and are negative.  Physical Exam: BP 116/66 (BP Location: Left Arm, Patient Position: Sitting, Cuff Size: Normal)   Pulse (!) 52   Ht $R'5\' 7"'Vz$  (1.702 m)   Wt 177 lb 3.2 oz (80.4 kg)   SpO2 97%   BMI 27.75 kg/m  GENERAL:  Well appearing WM in NAD HEENT:  PERRL, EOMI, sclera are clear. Oropharynx is clear. NECK:  No jugular venous distention, carotid upstroke brisk and symmetric, no bruits, no thyromegaly or adenopathy LUNGS:  Clear to auscultation bilaterally CHEST:  Unremarkable HEART:  RRR,  PMI not displaced or sustained,S1 and S2 within normal limits, no S3, no S4: no clicks, no rubs, no murmurs ABD:  Soft, nontender. BS +, no masses or bruits. No hepatomegaly, no splenomegaly EXT:  2 + pulses throughout, no edema, no cyanosis no clubbing SKIN:  Warm and dry.  No rashes NEURO:   Alert and oriented x 3. Cranial nerves II through XII intact. PSYCH:  Cognitively intact    LABORATORY DATA:    Lab Results  Component Value Date   WBC 4.8 09/29/2020   HGB 16.3 09/29/2020   HCT 48.9 09/29/2020   PLT 177 09/29/2020   GLUCOSE 273 (H) 09/29/2020   CHOL 282 (H) 09/29/2020   TRIG 743 (HH) 09/29/2020   HDL 28 (L) 09/29/2020   LDLDIRECT 55.5 05/07/2013   [place]  118 (H) 09/29/2020   ALT 40 09/29/2020   AST 35 09/29/2020   NA 133 (L) 09/29/2020   K 4.4 09/29/2020   CL 101 09/29/2020   CREATININE 1.01 09/29/2020   BUN 16 09/29/2020   CO2 20 09/29/2020   TSH 2.520 09/29/2020   INR 0.9 12/04/2019   HGBA1C 11.5 (H) 09/29/2020   Carotid dopplers June 2020: 40-59% RICA stenosis.   Labs reviewed from primary care on 12/04/15: cholesterol 139, triglycerides 628, LDL 40, HDL 24. A1c 12. Glucose 250. BUN 19, creatinine 1.23. Other chemistries and CBC normal.  Dated 09/23/16: glucose 237, creatinine 1.3. A1c 10.4. Cholesterol 110, triglycerides 124. HDL 27, LDL 59.  Dated 04/21/17: A1c 7.5. Creatinine 1.39. Other chemistries normal. Dated 05/29/19: cholesterol 133, triglycerides 190, HDL 29, LDL 72. A1c 7.7%. creatinine 1.31. glucose 500. Bicarb 18. Other chemistries normal. CBC and TSH normal.  Dated 08/29/19: A1c 9%.   Ecg today shows NSR rate 31 old septal infarct. I have personally reviewed and interpreted this study.   Myoview 09/30/16: Study Highlights   Clincally negative, electrically positive for ischemia Excellent exercise tolerance Basal inferior, inferolateral defect that normalizes in recovery period. May reflect soft tissue attenuation, cannot exclude very mild ischemia LVEF 50% Low to intermediate risk study    Assessment / Plan: 1. CAD - remote PCI of the LAD - known 2 vessel CAD with 50% ISR of the LAD and 70% intermediate lesion in 2013 - managed medically. Low risk Myoview in July 2018. He remains asymptomatic.  Continue with CV risk factor modification.  Recommend ASA instead of Aggrenox.   2. HTN - BP well controlled.   3. HLD - poorly controlled since has been out of medication. Also reflects poor glycemic control. Lipitor, Zetia and fenofibrate all renewed by PCP   4. DM type 2. Poorly controlled with A1c 11%. Meds renewed with PCP. Will need close follow up to ensure improved control.     Follow up in 6 months.

## 2020-09-29 ENCOUNTER — Other Ambulatory Visit: Payer: Self-pay

## 2020-09-29 ENCOUNTER — Ambulatory Visit (INDEPENDENT_AMBULATORY_CARE_PROVIDER_SITE_OTHER): Payer: Self-pay | Admitting: Nurse Practitioner

## 2020-09-29 ENCOUNTER — Encounter: Payer: Self-pay | Admitting: Nurse Practitioner

## 2020-09-29 VITALS — BP 152/81 | HR 56 | Temp 98.3°F | Ht 67.0 in | Wt 179.2 lb

## 2020-09-29 DIAGNOSIS — Z7689 Persons encountering health services in other specified circumstances: Secondary | ICD-10-CM | POA: Insufficient documentation

## 2020-09-29 DIAGNOSIS — Z125 Encounter for screening for malignant neoplasm of prostate: Secondary | ICD-10-CM | POA: Insufficient documentation

## 2020-09-29 DIAGNOSIS — Z0000 Encounter for general adult medical examination without abnormal findings: Secondary | ICD-10-CM | POA: Insufficient documentation

## 2020-09-29 DIAGNOSIS — I25119 Atherosclerotic heart disease of native coronary artery with unspecified angina pectoris: Secondary | ICD-10-CM

## 2020-09-29 DIAGNOSIS — M5116 Intervertebral disc disorders with radiculopathy, lumbar region: Secondary | ICD-10-CM | POA: Insufficient documentation

## 2020-09-29 DIAGNOSIS — E785 Hyperlipidemia, unspecified: Secondary | ICD-10-CM

## 2020-09-29 DIAGNOSIS — E119 Type 2 diabetes mellitus without complications: Secondary | ICD-10-CM

## 2020-09-29 MED ORDER — LOSARTAN POTASSIUM 25 MG PO TABS: 25.0000 mg | ORAL_TABLET | Freq: Every day | ORAL | 1 refills | Status: DC

## 2020-09-29 MED ORDER — FENOFIBRATE 160 MG PO TABS: 160.0000 mg | ORAL_TABLET | Freq: Every day | ORAL | 1 refills | Status: DC

## 2020-09-29 MED ORDER — EZETIMIBE 10 MG PO TABS: 10.0000 mg | ORAL_TABLET | Freq: Every evening | ORAL | 1 refills | Status: DC

## 2020-09-29 MED ORDER — HYDROCODONE-ACETAMINOPHEN 10-325 MG PO TABS: 1.0000 | ORAL_TABLET | Freq: Four times a day (QID) | ORAL | 0 refills | Status: DC | PRN

## 2020-09-29 MED ORDER — METFORMIN HCL 500 MG PO TABS: 500.0000 mg | ORAL_TABLET | Freq: Four times a day (QID) | ORAL | 1 refills | Status: DC

## 2020-09-29 MED ORDER — GLYBURIDE 5 MG PO TABS
10.0000 mg | ORAL_TABLET | Freq: Two times a day (BID) | ORAL | 1 refills | Status: DC
Start: 2020-09-29 — End: 2021-06-15

## 2020-09-29 MED ORDER — ASPIRIN-DIPYRIDAMOLE ER 25-200 MG PO CP12: 1.0000 | ORAL_CAPSULE | Freq: Two times a day (BID) | ORAL | 1 refills | Status: DC

## 2020-09-29 MED ORDER — ATORVASTATIN CALCIUM 80 MG PO TABS
80.0000 mg | ORAL_TABLET | Freq: Every day | ORAL | 1 refills | Status: DC
Start: 2020-09-29 — End: 2021-10-04

## 2020-09-29 MED ORDER — BLOOD GLUCOSE METER KIT: PACK | 0 refills | Status: AC

## 2020-09-29 MED ORDER — JARDIANCE 25 MG PO TABS: 25.0000 mg | ORAL_TABLET | Freq: Every day | ORAL | 5 refills | Status: DC

## 2020-09-29 MED ORDER — PIOGLITAZONE HCL 15 MG PO TABS: 15.0000 mg | ORAL_TABLET | Freq: Every day | ORAL | 1 refills | Status: DC

## 2020-09-29 MED ORDER — METOPROLOL TARTRATE 25 MG PO TABS: 25.0000 mg | ORAL_TABLET | Freq: Two times a day (BID) | ORAL | 1 refills | Status: DC

## 2020-09-29 NOTE — Progress Notes (Signed)
New Patient Office Visit  Subjective:  Patient ID: Mario Arnold, male    DOB: Aug 30, 1955  Age: 65 y.o. MRN: [id number]  CC:  Chief Complaint  Patient presents with   New Patient (Initial Visit)    HPI Mario Arnold presents to establish new primary care provider.  Patient states he retired last year.  The provider he was seeing retired as well.  The patient states that due to his retirement, he went for a while without health insurance.  While his previous provider was still working, he was feeling the patient's medications to treat diabetes, hypertension, and hyperlipidemia.  After the provider retired, the office will no longer fill the patient's medications.  Patient states that he now has new insurance.  Prior provider is no longer covered under his new insurance.  The patient states he has been out of some of his medications for some time this includes medications to treat his type 2 diabetes. The patient did suffer a heart attack when he was 65 years old.  States he has been seeing a cardiologist ever since.  Last saw cardiologist 1 year ago.  Has appointment to see him this coming Thursday. Does have significant low back pain which radiates into both legs and feet, more severe on the right side.  He has significant pain on the posterior portion of his right upper leg from the back of the knee to the hip.  Feels like a bad cramp.  He also has weakness in both feet.  Weakness is more considerable on the left side.  States he is unable to flex his foot.  This interferes in his ability to dress and walk normally.  The patient was seeing a neurosurgeon at [facility].  States that his insurance does not cover this office any longer.  The patient would like to find a new provider who will help to manage his back pain and sciatica.  In the meantime, patient has been taking hydrocodone/acetaminophen 10/325 mg tablets when needed for pain.  He states he takes this about once a day  if needed.  I reviewed his PDMP profile today.  His overdose or score was 120.  The last time he filled hydrocodone/acetaminophen was 08/19/2019. Patient denies chest pain, chest pressure, or shortness of breath. He denies headaches or visual disturbances. He denies abdominal pain, nausea, vomiting, or changes in bowel or bladder habits.   He is due for routine fasting blood work today.  States that his blood sugar and cholesterol will likely be elevated as he has been out of these medications for some time.  Past Medical History:  Diagnosis Date   Arthritis    Carotid disease, bilateral (HCC) June 2013   less than 50% stenosis in the right and left    Chronic kidney disease 2017   stones   Coronary artery disease    STATUS POST ANGIOPLASTY OF THE LAD    Diabetes mellitus    TYPE 2   Dyslipidemia    History of transient ischemic attack (TIA)    on aggrenox   Hyperlipidemia    Myocardial infarction [facility]) 1999   stent placement- per patient has 2 stents   Stroke [facility])    Thyroid nodule June 2013   had [place] with 4 mm small cystic nodule noted. Needs follow up US in 12 months. TSH is normal.     Past Surgical History:  Procedure Laterality Date   CORONARY ANGIOPLASTY WITH STENT PLACEMENT  2003   LAD. EJECTION FRACTION IS 45%   SHOULDER ARTHROSCOPY WITH OPEN ROTATOR CUFF REPAIR AND DISTAL CLAVICLE ACROMINECTOMY Right 09/05/2012   Procedure: RIGHT SHOULDER ARTHROSCOPY WITH LABRAL DEBRIDEMENT AND OPEN DISTAL CLAVICLE RESECTION, acromionectomy  AND OPEN ROTATOR CUFF REPAIR;  Surgeon: Drucilla Schmidt, MD;  Location: [facility];  Service: Orthopedics;  Laterality: Right;    Family History  Problem Relation Age of Onset   Coronary artery disease Mother    Heart attack Mother    Diabetes Mother    Transient ischemic attack Mother    Heart disease Mother    Cancer Father     Social History   Socioeconomic History   Marital status: Married    Spouse name: Mario Arnold   Number of  children: 2   Years of education: Not on file   Highest education level: Not on file  Occupational History   Occupation: Repair semi trailers.  Tobacco Use   Smoking status: Former    Packs/day: 1.50    Years: 23.00    Pack years: 34.50    Types: Cigarettes    Quit date: 03/28/1997    Years since quitting: 23.5   Smokeless tobacco: Former    Types: Snuff, Chew   Tobacco comments:    as a teen  Vaping Use   Vaping Use: Never used  Substance and Sexual Activity   Alcohol use: Yes    Comment: socially   Drug use: No   Sexual activity: Not Currently  Other Topics Concern   Not on file  Social History Narrative   Not on file   Social Determinants of Health   Financial Resource Strain: Not on file  Food Insecurity: Not on file  Transportation Needs: Not on file  Physical Activity: Not on file  Stress: Not on file  Social Connections: Not on file  Intimate Partner Violence: Not on file    ROS Review of Systems  Constitutional:  Positive for activity change. Negative for chills, fatigue and fever.  HENT:  Negative for congestion, postnasal drip, rhinorrhea and sinus pressure.   Eyes: Negative.   Respiratory:  Negative for cough and chest tightness.   Cardiovascular:  Negative for chest pain and palpitations.  Gastrointestinal:  Negative for constipation, diarrhea, nausea and vomiting.  Endocrine: Negative for cold intolerance, heat intolerance, polydipsia and polyuria.       Blood sugars likely to be very elevated as patient has been out of metformin for a while.  Genitourinary: Negative.   Musculoskeletal:  Positive for arthralgias, back pain and myalgias.       Patient does have chronic low back pain which radiates into both legs.  Pain is more severe on the right.  Weakness more significant on the left.  Skin:  Negative for rash.  Allergic/Immunologic: Negative.   Neurological:  Negative for dizziness, weakness and headaches.  Psychiatric/Behavioral:  The patient is  not nervous/anxious.    Objective:   Today's Vitals   09/29/20 1030  BP: (!) 152/81  Pulse: (!) 56  Temp: 98.3 F (36.8 C)  SpO2: 97%  Weight: 179 lb 3.2 oz (81.3 kg)  Height: 5\' 7"  (1.702 m)   Body mass index is 28.07 kg/m.   Physical Exam Vitals and nursing note reviewed.  Constitutional:      Appearance: Normal appearance. He is well-developed.  HENT:     Head: Normocephalic and atraumatic.  Eyes:     Pupils: Pupils are equal, round, and reactive to light.  Neck:     Vascular: No carotid bruit.  Cardiovascular:     Rate and Rhythm: Normal rate and regular rhythm.     Pulses: Normal pulses.     Heart sounds: Normal heart sounds.  Pulmonary:     Effort: Pulmonary effort is normal.     Breath sounds: Normal breath sounds.  Abdominal:     Palpations: Abdomen is soft.  Musculoskeletal:        General: No swelling, tenderness or deformity. Normal range of motion.     Cervical back: Normal range of motion and neck supple.     Comments: The patient does have considerable weakness in the left foot.  Flexion and plantar flexion ability diminished from normal.  Patient does walk with a limp on the left side.  Lymphadenopathy:     Cervical: No cervical adenopathy.  Skin:    General: Skin is warm and dry.     Capillary Refill: Capillary refill takes less than 2 seconds.  Neurological:     General: No focal deficit present.     Mental Status: He is alert and oriented to person, place, and time.  Psychiatric:        Mood and Affect: Mood normal.        Behavior: Behavior normal.        Thought Content: Thought content normal.        Judgment: Judgment normal.    Assessment & Plan:  1. Encounter to establish care Appointment today to establish new primary care provider.  2. Diabetes mellitus type 2 in nonobese [facility]) Patient has been out of multiple medications including medications to treat type 2 diabetes.  We will check hemoglobin A1c today.  Renew all diabetic  medications.  New order for blood glucose monitor and supplies for him to check blood sugars daily prior to meals.  The goal will be to get fasting blood sugars to 150 or below.  Patient should bring blood sugar log with him to next visit. - CBC with Differential/Platelet - Comprehensive metabolic panel - Lipid panel - TSH - Hemoglobin A1c - glyBURIDE (DIABETA) 5 MG tablet; Take 2 tablets (10 mg total) by mouth 2 (two) times daily with a meal.  Dispense: 180 tablet; Refill: 1 - JARDIANCE 25 MG TABS tablet; Take 1 tablet (25 mg total) by mouth daily.  Dispense: 30 tablet; Refill: 5 - metFORMIN (GLUCOPHAGE) 500 MG tablet; Take 1 tablet (500 mg total) by mouth 4 (four) times daily. Two in am and 2 in pm  Dispense: 360 tablet; Refill: 1 - pioglitazone (ACTOS) 15 MG tablet; Take 1 tablet (15 mg total) by mouth daily.  Dispense: 90 tablet; Refill: 1 - blood glucose meter kit and supplies; Dispense based on patient and insurance preference. Use up to four times daily as directed. (FOR ICD-10 E10.9, E11.9).  Dispense: 1 each; Refill: 0  3. Coronary artery disease involving native coronary artery of native heart with angina pectoris ([place]) Blood pressure elevated today as patient has been out of some medications for some time.  Renew blood pressure medications and Aggrenox today.  Check routine, fasting labs at today's visit.  Will review results with patient at next visit. - CBC with Differential/Platelet - Comprehensive metabolic panel - Lipid panel - TSH - dipyridamole-aspirin (AGGRENOX) 200-25 MG 12hr capsule; Take 1 capsule by mouth 2 (two) times daily.  Dispense: 180 capsule; Refill: 1 - JARDIANCE 25 MG TABS tablet; Take 1 tablet (25 mg total) by mouth daily.  Dispense:  30 tablet; Refill: 5 - losartan (COZAAR) 25 MG tablet; Take 1 tablet (25 mg total) by mouth daily.  Dispense: 90 tablet; Refill: 1 - metoprolol tartrate (LOPRESSOR) 25 MG tablet; Take 1 tablet (25 mg total) by mouth 2 (two) times  daily.  Dispense: 180 tablet; Refill: 1  4. Dyslipidemia Patient fasting today.  Check routine, fasting labs.  Renew atorvastatin, Zetia, and fenofibrate.  Take daily with dinner. - CBC with Differential/Platelet - Comprehensive metabolic panel - Lipid panel - TSH - ezetimibe (ZETIA) 10 MG tablet; Take 1 tablet (10 mg total) by mouth every evening.  Dispense: 90 tablet; Refill: 1 - fenofibrate 160 MG tablet; Take 1 tablet (160 mg total) by mouth daily.  Dispense: 90 tablet; Refill: 1  5. Lumbar disc disease with radiculopathy Discussed referral to spinal interventionalist.  Gave patient suggestion of provider.  Patient will check with insurance and if covered will call the office and we will make referral.  In the meantime, will renew prescription for hydrocodone/acetaminophen 10/325 mg tablets.  Prescription written for up to 4 times daily however patient takes once daily at the most. Reviewed risks and possible side effects associated with taking opiates, benzodiazepines and other CNS depressants. Combination of these could cause dizziness and drowsiness. Advised patient not to drive or operate machinery when taking these medications, as patient's and other's life can be at risk and will have consequences. Patient verbalized understanding in this matter.   - HYDROcodone-acetaminophen (NORCO) 10-325 MG tablet; Take 1 tablet by mouth every 6 (six) hours as needed. for pain  Dispense: 21 tablet; Refill: 0  6. Healthcare maintenance Routine, fasting blood work was drawn at today's visit. - CBC with Differential/Platelet - Comprehensive metabolic panel - Lipid panel - TSH  7. Screening for prostate cancer A PSA was drawn with labs today. - PSA   Problem List Items Addressed This Visit       Cardiovascular and Mediastinum   Coronary artery disease   Relevant Medications   atorvastatin (LIPITOR) 80 MG tablet   dipyridamole-aspirin (AGGRENOX) 200-25 MG 12hr capsule   ezetimibe (ZETIA) 10  MG tablet   fenofibrate 160 MG tablet   JARDIANCE 25 MG TABS tablet   losartan (COZAAR) 25 MG tablet   metoprolol tartrate (LOPRESSOR) 25 MG tablet   Other Relevant Orders   CBC with Differential/Platelet   Comprehensive metabolic panel   Lipid panel   TSH     Endocrine   Diabetes mellitus type 2 in nonobese (HCC)   Relevant Medications   atorvastatin (LIPITOR) 80 MG tablet   glyBURIDE (DIABETA) 5 MG tablet   JARDIANCE 25 MG TABS tablet   losartan (COZAAR) 25 MG tablet   metFORMIN (GLUCOPHAGE) 500 MG tablet   pioglitazone (ACTOS) 15 MG tablet   blood glucose meter kit and supplies   Other Relevant Orders   CBC with Differential/Platelet   Comprehensive metabolic panel   Lipid panel   TSH   Hemoglobin A1c     Nervous and Auditory   Lumbar disc disease with radiculopathy   Relevant Medications   HYDROcodone-acetaminophen (NORCO) 10-325 MG tablet     Other   Dyslipidemia   Relevant Medications   atorvastatin (LIPITOR) 80 MG tablet   ezetimibe (ZETIA) 10 MG tablet   fenofibrate 160 MG tablet   Other Relevant Orders   CBC with Differential/Platelet   Comprehensive metabolic panel   Lipid panel   TSH   Encounter to establish care - Primary   Healthcare maintenance  Relevant Orders   CBC with Differential/Platelet   Comprehensive metabolic panel   Lipid panel   TSH   Screening for prostate cancer   Relevant Orders   PSA    Outpatient Encounter Medications as of 09/29/2020  Medication Sig   acetaminophen (TYLENOL) 500 MG tablet Take 1,000 mg by mouth as needed for mild pain.    blood glucose meter kit and supplies Dispense based on patient and insurance preference. Use up to four times daily as directed. (FOR ICD-10 E10.9, E11.9).   isosorbide mononitrate (IMDUR) 60 MG 24 hr tablet TAKE 1 TABLET BY MOUTH IN THE EVENING(NEED OFFICE VISIT FOR FUTURE REFILLS)   nitroGLYCERIN (NITROSTAT) 0.4 MG SL tablet Place 1 tablet (0.4 mg total) under the tongue every 5 (five)  minutes as needed.   [DISCONTINUED] atorvastatin (LIPITOR) 80 MG tablet Take 80 mg by mouth daily.   [DISCONTINUED] dipyridamole-aspirin (AGGRENOX) 200-25 MG 12hr capsule Take 1 capsule by mouth 2 (two) times daily.   [DISCONTINUED] ezetimibe (ZETIA) 10 MG tablet Take 10 mg by mouth every evening.   [DISCONTINUED] fenofibrate 160 MG tablet TAKE 1 TABLET BY MOUTH ONCE DAILY(KEEP UP COMING APPT FOR REFILLS)   [DISCONTINUED] glyBURIDE (DIABETA) 5 MG tablet Take 10 mg by mouth 2 (two) times daily with a meal.    [DISCONTINUED] HYDROcodone-acetaminophen (NORCO) 10-325 MG tablet Take 1 tablet by mouth every 6 (six) hours as needed. for pain   [DISCONTINUED] losartan (COZAAR) 25 MG tablet Take 25 mg by mouth daily.   [DISCONTINUED] metFORMIN (GLUCOPHAGE) 500 MG tablet Take 500 mg by mouth 4 (four) times daily. Two in am and 2 in pm   [DISCONTINUED] metoprolol tartrate (LOPRESSOR) 25 MG tablet Take 25 mg by mouth 2 (two) times daily.    atorvastatin (LIPITOR) 80 MG tablet Take 1 tablet (80 mg total) by mouth daily.   dipyridamole-aspirin (AGGRENOX) 200-25 MG 12hr capsule Take 1 capsule by mouth 2 (two) times daily.   ezetimibe (ZETIA) 10 MG tablet Take 1 tablet (10 mg total) by mouth every evening.   fenofibrate 160 MG tablet Take 1 tablet (160 mg total) by mouth daily.   glyBURIDE (DIABETA) 5 MG tablet Take 2 tablets (10 mg total) by mouth 2 (two) times daily with a meal.   HYDROcodone-acetaminophen (NORCO) 10-325 MG tablet Take 1 tablet by mouth every 6 (six) hours as needed. for pain   JARDIANCE 25 MG TABS tablet Take 1 tablet (25 mg total) by mouth daily.   losartan (COZAAR) 25 MG tablet Take 1 tablet (25 mg total) by mouth daily.   metFORMIN (GLUCOPHAGE) 500 MG tablet Take 1 tablet (500 mg total) by mouth 4 (four) times daily. Two in am and 2 in pm   metoprolol tartrate (LOPRESSOR) 25 MG tablet Take 1 tablet (25 mg total) by mouth 2 (two) times daily.   pioglitazone (ACTOS) 15 MG tablet Take 1  tablet (15 mg total) by mouth daily.   [DISCONTINUED] JARDIANCE 25 MG TABS tablet Take 1 tablet by mouth daily. (Patient not taking: Reported on 09/29/2020)   [DISCONTINUED] pioglitazone (ACTOS) 15 MG tablet Take 15 mg by mouth daily.   No facility-administered encounter medications on file as of 09/29/2020.    Follow-up: Return in about 2 weeks (around 10/13/2020) for [place] - neds foot exam .   Ronnell Freshwater, NP

## 2020-09-30 LAB — COMPREHENSIVE METABOLIC PANEL
ALT: 40 IU/L (ref 0–44)
AST: 35 IU/L (ref 0–40)
Albumin/Globulin Ratio: 1.6 (ref 1.2–2.2)
Albumin: 4.2 g/dL (ref 3.8–4.8)
Alkaline Phosphatase: 113 IU/L (ref 44–121)
BUN/Creatinine Ratio: 16 (ref 10–24)
BUN: 16 mg/dL (ref 8–27)
Bilirubin Total: 0.3 mg/dL (ref 0.0–1.2)
CO2: 20 mmol/L (ref 20–29)
Calcium: 9.2 mg/dL (ref 8.6–10.2)
Chloride: 101 mmol/L (ref 96–106)
Creatinine, Ser: 1.01 mg/dL (ref 0.76–1.27)
Globulin, Total: 2.7 g/dL (ref 1.5–4.5)
Glucose: 273 mg/dL — ABNORMAL HIGH (ref 65–99)
Potassium: 4.4 mmol/L (ref 3.5–5.2)
Sodium: 133 mmol/L — ABNORMAL LOW (ref 134–144)
Total Protein: 6.9 g/dL (ref 6.0–8.5)
eGFR: 84 mL/min/{1.73_m2} (ref >59)

## 2020-09-30 LAB — CBC WITH DIFFERENTIAL/PLATELET
Basophils Absolute: 0 10*3/uL (ref 0.0–0.2)
Basos: 1 %
EOS (ABSOLUTE): 0.2 10*3/uL (ref 0.0–0.4)
Eos: 4 %
Hematocrit: 48.9 % (ref 37.5–51.0)
Hemoglobin: 16.3 g/dL (ref 13.0–17.7)
Immature Grans (Abs): 0 10*3/uL (ref 0.0–0.1)
Immature Granulocytes: 0 %
Lymphocytes Absolute: 1.5 10*3/uL (ref 0.7–3.1)
Lymphs: 32 %
MCH: 28.4 pg (ref 26.6–33.0)
MCHC: 33.3 g/dL (ref 31.5–35.7)
MCV: 85 fL (ref 79–97)
Monocytes Absolute: 0.5 10*3/uL (ref 0.1–0.9)
Monocytes: 11 %
Neutrophils Absolute: 2.4 10*3/uL (ref 1.4–7.0)
Neutrophils: 52 %
Platelets: 177 10*3/uL (ref 150–450)
RBC: 5.74 x10E6/uL (ref 4.14–5.80)
RDW: 12.5 % (ref 11.6–15.4)
WBC: 4.8 10*3/uL (ref 3.4–10.8)

## 2020-09-30 LAB — LIPID PANEL
Chol/HDL Ratio: 10.1 ratio — ABNORMAL HIGH (ref 0.0–5.0)
Cholesterol, Total: 282 mg/dL — ABNORMAL HIGH (ref 100–199)
HDL: 28 mg/dL — ABNORMAL LOW (ref >39)
LDL Chol Calc (NIH): 118 mg/dL — ABNORMAL HIGH (ref 0–99)
Triglycerides: 743 mg/dL (ref 0–149)
VLDL Cholesterol Cal: 136 mg/dL — ABNORMAL HIGH (ref 5–40)

## 2020-09-30 LAB — HEMOGLOBIN A1C
Est. average glucose Bld gHb Est-mCnc: 283 mg/dL
Hgb A1c MFr Bld: 11.5 % — ABNORMAL HIGH (ref 4.8–5.6)

## 2020-09-30 LAB — TSH: TSH: 2.52 u[IU]/mL (ref 0.450–4.500)

## 2020-09-30 LAB — PSA: Prostate Specific Ag, Serum: 0.8 ng/mL (ref 0.0–4.0)

## 2020-09-30 NOTE — Progress Notes (Signed)
Glucose and triglycerides are very elevated. Patient had been out of multiple medications, including medications to treat blood sugars and cholesterol. Will discuss with patient at next visit on 10/20/2020. Recheck levels in 3 months.

## 2020-10-01 ENCOUNTER — Ambulatory Visit (INDEPENDENT_AMBULATORY_CARE_PROVIDER_SITE_OTHER): Payer: 59 | Admitting: Cardiology

## 2020-10-01 ENCOUNTER — Other Ambulatory Visit: Payer: Self-pay

## 2020-10-01 ENCOUNTER — Encounter: Payer: Self-pay | Admitting: Cardiology

## 2020-10-01 ENCOUNTER — Telehealth: Payer: Self-pay | Admitting: Cardiology

## 2020-10-01 VITALS — BP 116/66 | HR 52 | Ht 67.0 in | Wt 177.2 lb

## 2020-10-01 DIAGNOSIS — E119 Type 2 diabetes mellitus without complications: Secondary | ICD-10-CM | POA: Diagnosis not present

## 2020-10-01 DIAGNOSIS — I10 Essential (primary) hypertension: Secondary | ICD-10-CM

## 2020-10-01 DIAGNOSIS — E7800 Pure hypercholesterolemia, unspecified: Secondary | ICD-10-CM

## 2020-10-01 DIAGNOSIS — I25118 Atherosclerotic heart disease of native coronary artery with other forms of angina pectoris: Secondary | ICD-10-CM

## 2020-10-01 MED ORDER — ASPIRIN EC 81 MG PO TBEC: 81.0000 mg | DELAYED_RELEASE_TABLET | Freq: Every day | ORAL | 3 refills | Status: AC

## 2020-10-01 NOTE — Telephone Encounter (Signed)
Spoke to patient he stated he cannot afford Jardiance.Stated he is waiting on a call back from PCP.He will ask PCP about patient assistance.

## 2020-10-01 NOTE — Telephone Encounter (Signed)
Called patient, advised that we needed to wait to see what PCP says and then we can see what Dr.Jordan says.  Patient verbalized understanding.

## 2020-10-01 NOTE — Telephone Encounter (Signed)
Pt c/o medication issue: 1. Name of Medication: Jordance  2. How are you currently taking this medication (dosage and times per day)? Once a day 3. Are you having a reaction (difficulty breathing--STAT)?  Assistants  4. What is your medication issue? Patient need some assistants.

## 2020-10-01 NOTE — Telephone Encounter (Signed)
Called patient, he notified that his [place] was too much money and he could not afford this. After review of the chart I noticed that his PCP was the one who prescribed this. I asked him if he contacted his PCP yet and he states he had not. I did advise there was patient assistance that could be done, but it should be done by the one who gave the medication. Patient advised to call his PCP and make them aware of this, and see what they suggest. I advised I would send a message to MD to advise of any further assistance. Patient verbalized understanding.

## 2020-10-01 NOTE — Telephone Encounter (Signed)
Follow up:      ]patient returning call back to speak with a nurse.

## 2020-10-01 NOTE — Telephone Encounter (Signed)
Please see phone about Jardiance and getting medication assistance for this.  The price is over $500.00.  Please call patient.

## 2020-10-02 NOTE — Telephone Encounter (Signed)
He should follow up with his primary care. He may qualify for patient assistance for either [place] or Farxiga. They should be able to help him apply  Fabienne Nolasco [place] MD, [facility]

## 2020-10-02 NOTE — Telephone Encounter (Signed)
Hey Katrina. Can you have the patient call his new insurance and find out if they prefer farxiga or invokana over he jardiance? I can change the prescription to make it less expensive. Thanks.  HB

## 2020-10-02 NOTE — Telephone Encounter (Signed)
Great. Thank you.

## 2020-10-05 ENCOUNTER — Other Ambulatory Visit: Payer: Self-pay | Admitting: Nurse Practitioner

## 2020-10-05 NOTE — Addendum Note (Signed)
Addended by: Sylvester Harder on: 10/05/2020 12:20 PM   Modules accepted: Orders

## 2020-10-05 NOTE — Telephone Encounter (Signed)
Ben- patient will need to contact different pharmacy's to inquire costs of medication.    Heather- Imdur not prescribed by you before. Please approve if appropriate. AS, CMA

## 2020-10-05 NOTE — Telephone Encounter (Signed)
Patient would like to know if there was anywhere he can get Jardiance cheaper, and patient also needs his isosorbide mononitrate called into [facility], thanks.

## 2020-10-06 MED ORDER — ISOSORBIDE MONONITRATE ER 60 MG PO TB24: 60.0000 mg | ORAL_TABLET | Freq: Every day | ORAL | 0 refills | Status: DC

## 2020-10-06 NOTE — Telephone Encounter (Signed)
Patient should be getting this from cardiology after he sees them again.

## 2020-10-13 ENCOUNTER — Other Ambulatory Visit: Payer: Self-pay

## 2020-10-13 ENCOUNTER — Ambulatory Visit (INDEPENDENT_AMBULATORY_CARE_PROVIDER_SITE_OTHER): Payer: 59 | Admitting: Physician Assistant

## 2020-10-13 ENCOUNTER — Encounter: Payer: Self-pay | Admitting: Orthopedic Surgery

## 2020-10-13 ENCOUNTER — Ambulatory Visit (INDEPENDENT_AMBULATORY_CARE_PROVIDER_SITE_OTHER): Payer: 59

## 2020-10-13 VITALS — Ht 67.0 in | Wt 177.0 lb

## 2020-10-13 DIAGNOSIS — G8929 Other chronic pain: Secondary | ICD-10-CM

## 2020-10-13 DIAGNOSIS — M545 Low back pain, unspecified: Secondary | ICD-10-CM

## 2020-10-13 DIAGNOSIS — M21372 Foot drop, left foot: Secondary | ICD-10-CM | POA: Diagnosis not present

## 2020-10-13 NOTE — Progress Notes (Signed)
Office Visit Note   Patient: Mario Arnold           Date of Birth: 1955-12-16           MRN: [id number] Visit Date: 10/13/2020              Requested by: Carlean Jews, NP [address] [place],  [place] [address] PCP: Carlean Jews, NP  Chief Complaint  Patient presents with   Lower Back - Pain      HPI: Patient is a pleasant 65 year old gentleman with a 5 to 18-month history of pain running down his right posterior leg.  He also complains of his feet being "tired "he also states on his left foot he is having difficulty with scrunching his toes and flexing his ankle up.  This is making it difficult for him to walk properly.  He has had back issues in the before and in 2017 and has had injections in the past for pain.  He denies any loss of bowel or bladder control  Assessment & Plan: Visit Diagnoses:  1. Chronic low back pain, unspecified back pain laterality, unspecified whether sciatica present   2. Left foot drop     Plan: I am concerned for progressive radicular symptoms.  He has a recent developing foot drop.  I will refer him for an MRI emergent with follow-up with one of our back specialist.  In the meantime I talked about him getting a over-the-counter AFO posterior that he could use to help support his foot.  Follow-Up Instructions: No follow-ups on file.   Ortho Exam  Patient is alert, oriented, no adenopathy, well-dressed, normal affect, normal respiratory effort. He is nontender over the spine.  He does trace pain down the right posterior buttock into his posterior knee.  On the left side he has 4 out of 5 strength with plantar flexion.  He has very little dorsiflexion strength.  On the left.  On the right side he has 4 out of 5 plantar and dorsiflexion strength.  Also inability to flex toes.  No muscle atrophy is noted today.  Bilateral pulses are intact no evidence of any infective process.  Imaging: No results found. No images are attached to the  encounter.  Labs: Lab Results  Component Value Date   HGBA1C 11.5 (H) 09/29/2020   HGBA1C 10.8 (H) 05/17/2013     Lab Results  Component Value Date   ALBUMIN 4.2 09/29/2020   ALBUMIN 4.1 12/04/2019   ALBUMIN 4.4 05/07/2013    No results found for: MG No results found for: VD25OH  No results found for: PREALBUMIN CBC EXTENDED Latest Ref Rng & Units 09/29/2020 12/04/2019 12/04/2019  WBC 3.4 - 10.8 x10E3/uL 4.8 - 5.4  RBC 4.14 - 5.80 x10E6/uL 5.74 - 5.67  HGB 13.0 - 17.7 g/dL 69.7 94.8 01.6  HCT 55.3 - 51.0 % 48.9 50.0 50.9  PLT 150 - 450 x10E3/uL 177 - 191  NEUTROABS 1.4 - 7.0 x10E3/uL 2.4 - 3.0  LYMPHSABS 0.7 - 3.1 x10E3/uL 1.5 - 1.6     Body mass index is 27.72 kg/m.  Orders:  Orders Placed This Encounter  Procedures   XR Lumbar Spine 2-3 Views   MR Lumbar Spine w/o contrast   No orders of the defined types were placed in this encounter.    Procedures: No procedures performed  Clinical Data: No additional findings.  ROS:  All other systems negative, except as noted in the HPI. Review  of Systems  Objective: Vital Signs: Ht 5\' 7"  (1.702 m)   Wt 177 lb (80.3 kg)   BMI 27.72 kg/m   Specialty Comments:  No specialty comments available.  PMFS History: Patient Active Problem List   Diagnosis Date Noted   Encounter to establish care 09/29/2020   Lumbar disc disease with radiculopathy 09/29/2020   Healthcare maintenance 09/29/2020   Screening for prostate cancer 09/29/2020   Carotid disease, bilateral (HCC) 10/06/2011   Thyroid nodule 10/06/2011   Coronary artery disease    Diabetes mellitus type 2 in nonobese [facility])    Dyslipidemia    History of transient ischemic attack (TIA)    Past Medical History:  Diagnosis Date   Arthritis    Carotid disease, bilateral (HCC) June 2013   less than 50% stenosis in the right and left    Chronic kidney disease 2017   stones   Coronary artery disease    STATUS POST ANGIOPLASTY OF THE LAD    Diabetes mellitus     TYPE 2   Dyslipidemia    History of transient ischemic attack (TIA)    on aggrenox   Hyperlipidemia    Myocardial infarction [facility]) 1999   stent placement- per patient has 2 stents   Stroke [facility])    Thyroid nodule June 2013   had July 2013 with 4 mm small cystic nodule noted. Needs follow up US in 12 months. TSH is normal.     Family History  Problem Relation Age of Onset   Coronary artery disease Mother    Heart attack Mother    Diabetes Mother    Transient ischemic attack Mother    Heart disease Mother    Cancer Father     Past Surgical History:  Procedure Laterality Date   CORONARY ANGIOPLASTY WITH STENT PLACEMENT  2003   LAD. EJECTION FRACTION IS 45%   SHOULDER ARTHROSCOPY WITH OPEN ROTATOR CUFF REPAIR AND DISTAL CLAVICLE ACROMINECTOMY Right 09/05/2012   Procedure: RIGHT SHOULDER ARTHROSCOPY WITH LABRAL DEBRIDEMENT AND OPEN DISTAL CLAVICLE RESECTION, acromionectomy  AND OPEN ROTATOR CUFF REPAIR;  Surgeon: 11/05/2012, MD;  Location: [facility];  Service: Orthopedics;  Laterality: Right;   Social History   Occupational History   Occupation: Repair semi trailers.  Tobacco Use   Smoking status: Former    Packs/day: 1.50    Years: 23.00    Pack years: 34.50    Types: Cigarettes    Quit date: 03/28/1997    Years since quitting: 23.5   Smokeless tobacco: Former    Types: Snuff, Chew   Tobacco comments:    as a teen  Vaping Use   Vaping Use: Never used  Substance and Sexual Activity   Alcohol use: Yes    Comment: socially   Drug use: No   Sexual activity: Not Currently

## 2020-10-20 ENCOUNTER — Ambulatory Visit (INDEPENDENT_AMBULATORY_CARE_PROVIDER_SITE_OTHER): Payer: 59 | Admitting: Nurse Practitioner

## 2020-10-20 ENCOUNTER — Other Ambulatory Visit: Payer: Self-pay

## 2020-10-20 ENCOUNTER — Encounter: Payer: Self-pay | Admitting: Nurse Practitioner

## 2020-10-20 VITALS — BP 110/61 | HR 62 | Temp 98.3°F | Ht 67.0 in | Wt 179.5 lb

## 2020-10-20 DIAGNOSIS — Z0001 Encounter for general adult medical examination with abnormal findings: Secondary | ICD-10-CM

## 2020-10-20 DIAGNOSIS — Z0000 Encounter for general adult medical examination without abnormal findings: Secondary | ICD-10-CM

## 2020-10-20 DIAGNOSIS — E119 Type 2 diabetes mellitus without complications: Secondary | ICD-10-CM

## 2020-10-20 DIAGNOSIS — I25119 Atherosclerotic heart disease of native coronary artery with unspecified angina pectoris: Secondary | ICD-10-CM

## 2020-10-20 DIAGNOSIS — E785 Hyperlipidemia, unspecified: Secondary | ICD-10-CM

## 2020-10-20 DIAGNOSIS — Z1211 Encounter for screening for malignant neoplasm of colon: Secondary | ICD-10-CM

## 2020-10-20 MED ORDER — PIOGLITAZONE HCL 30 MG PO TABS: 30.0000 mg | ORAL_TABLET | Freq: Every day | ORAL | 2 refills | Status: DC

## 2020-10-20 NOTE — Progress Notes (Signed)
Established Patient Office Visit  Subjective:  Patient ID: Mario Arnold, male    DOB: 1955/05/20  Age: 65 y.o. MRN: [id number]  CC:  Chief Complaint  Patient presents with   Annual Exam         HPI [name] presents for annual wellness visit.  Blood sugars very high.  Hemoglobin A1c 11.0 with recent labs.  Was unable to get prescription for Jardiance or Farxiga filled due to expense.  He is currently on glyburide 10 mg twice daily, metformin 1000 mg twice daily, and Actos 15 mg daily.  States he tolerates these medications well, just not enough to control her blood sugar.  Patient's recent blood work also showed very elevated lipid panel.  Lipid Panel     Component Value Date/Time   CHOL 282 (H) 09/29/2020 1122   TRIG 743 (HH) 09/29/2020 1122   HDL 28 (L) 09/29/2020 1122   CHOLHDL 10.1 (H) 09/29/2020 1122   CHOLHDL 4 05/07/2013 0855   VLDL 47.0 (H) 05/07/2013 0855   LDLCALC 118 (H) 09/29/2020 1122   LDLDIRECT 55.5 05/07/2013 0855   LABVLDL 136 (H) 09/29/2020 1122    Patient does have history of coronary artery disease.  He does see cardiology.  He currently takes atorvastatin 80 mg daily, fenofibrate 150 mg daily, and Zetia 10 mg daily.  The remainder of his labs are within normal limits. Patient has no current concerns or complaints today.  He denies chest pain, chest pressure, or shortness of breath. He denies headaches or visual disturbances. He denies abdominal pain, nausea, vomiting, or changes in bowel or bladder habits. Patient be referred to GI for screening colonoscopy. Will need to get medical records from Dr. Clarene Duke from [facility] for review.  Past Medical History:  Diagnosis Date   Arthritis    Carotid disease, bilateral (HCC) June 2013   less than 50% stenosis in the right and left    Chronic kidney disease 2017   stones   Coronary artery disease    STATUS POST ANGIOPLASTY OF THE LAD    Diabetes mellitus    TYPE 2   Dyslipidemia    History  of transient ischemic attack (TIA)    on aggrenox   Hyperlipidemia    Myocardial infarction [facility]) 1999   stent placement- per patient has 2 stents   Stroke [facility])    Thyroid nodule June 2013   had [place] with 4 mm small cystic nodule noted. Needs follow up US in 12 months. TSH is normal.     Past Surgical History:  Procedure Laterality Date   CORONARY ANGIOPLASTY WITH STENT PLACEMENT  2003   LAD. EJECTION FRACTION IS 45%   SHOULDER ARTHROSCOPY WITH OPEN ROTATOR CUFF REPAIR AND DISTAL CLAVICLE ACROMINECTOMY Right 09/05/2012   Procedure: RIGHT SHOULDER ARTHROSCOPY WITH LABRAL DEBRIDEMENT AND OPEN DISTAL CLAVICLE RESECTION, acromionectomy  AND OPEN ROTATOR CUFF REPAIR;  Surgeon: Drucilla Schmidt, MD;  Location: [facility];  Service: Orthopedics;  Laterality: Right;    Family History  Problem Relation Age of Onset   Coronary artery disease Mother    Heart attack Mother    Diabetes Mother    Transient ischemic attack Mother    Heart disease Mother    Cancer Father     Social History   Socioeconomic History   Marital status: Married    Spouse name: Andriy Sherk   Number of children: 2   Years of education: Not on file   Highest education  level: Not on file  Occupational History   Occupation: Repair semi trailers.  Tobacco Use   Smoking status: Former    Packs/day: 1.50    Years: 23.00    Pack years: 34.50    Types: Cigarettes    Quit date: 03/28/1997    Years since quitting: 23.6   Smokeless tobacco: Former    Types: Snuff, Chew   Tobacco comments:    as a teen  Vaping Use   Vaping Use: Never used  Substance and Sexual Activity   Alcohol use: Yes    Comment: socially   Drug use: No   Sexual activity: Not Currently  Other Topics Concern   Not on file  Social History Narrative   Not on file   Social Determinants of Health   Financial Resource Strain: Not on file  Food Insecurity: Not on file  Transportation Needs: Not on file  Physical Activity: Not on file  Stress:  Not on file  Social Connections: Not on file  Intimate Partner Violence: Not on file    Outpatient Medications Prior to Visit  Medication Sig Dispense Refill   acetaminophen (TYLENOL) 500 MG tablet Take 1,000 mg by mouth as needed for mild pain.      aspirin EC 81 MG tablet Take 1 tablet (81 mg total) by mouth daily. Swallow whole. 90 tablet 3   atorvastatin (LIPITOR) 80 MG tablet Take 1 tablet (80 mg total) by mouth daily. 90 tablet 1   blood glucose meter kit and supplies Dispense based on patient and insurance preference. Use up to four times daily as directed. (FOR ICD-10 E10.9, E11.9). 1 each 0   ezetimibe (ZETIA) 10 MG tablet Take 1 tablet (10 mg total) by mouth every evening. 90 tablet 1   fenofibrate 160 MG tablet Take 1 tablet (160 mg total) by mouth daily. 90 tablet 1   glyBURIDE (DIABETA) 5 MG tablet Take 2 tablets (10 mg total) by mouth 2 (two) times daily with a meal. 180 tablet 1   HYDROcodone-acetaminophen (NORCO) 10-325 MG tablet Take 1 tablet by mouth every 6 (six) hours as needed. for pain 21 tablet 0   isosorbide mononitrate (IMDUR) 60 MG 24 hr tablet Take 1 tablet (60 mg total) by mouth daily. 90 tablet 0   losartan (COZAAR) 25 MG tablet Take 1 tablet (25 mg total) by mouth daily. 90 tablet 1   metFORMIN (GLUCOPHAGE) 500 MG tablet Take 1 tablet (500 mg total) by mouth 4 (four) times daily. Two in am and 2 in pm 360 tablet 1   metoprolol tartrate (LOPRESSOR) 25 MG tablet Take 1 tablet (25 mg total) by mouth 2 (two) times daily. 180 tablet 1   nitroGLYCERIN (NITROSTAT) 0.4 MG SL tablet Place 1 tablet (0.4 mg total) under the tongue every 5 (five) minutes as needed. 25 tablet 6   pioglitazone (ACTOS) 15 MG tablet Take 1 tablet (15 mg total) by mouth daily. 90 tablet 1   No facility-administered medications prior to visit.    Allergies  Allergen Reactions   Hydromorphone Nausea And Vomiting    ROS Review of Systems  Constitutional:  Positive for activity change.  Negative for chills, fatigue and fever.  HENT:  Negative for congestion, postnasal drip, rhinorrhea, sinus pressure and sinus pain.   Eyes: Negative.   Respiratory:  Negative for cough, chest tightness and shortness of breath.   Cardiovascular:  Negative for chest pain and palpitations.  Gastrointestinal:  Negative for constipation, diarrhea, nausea and vomiting.  Endocrine: Negative for cold intolerance, heat intolerance, polydipsia and polyuria.       Blood sugars very uncontrolled.  Hemoglobin A1c 11.0 on recent labs.  Genitourinary:  Negative for dysuria, hematuria and urgency.  Musculoskeletal:  Positive for arthralgias, back pain and myalgias.       Patient does have chronic low back pain which radiates into both legs.  Pain is more severe on the right.  Weakness more significant on the left.  Skin:  Negative for rash.  Allergic/Immunologic: Negative.   Neurological:  Negative for dizziness, weakness and headaches.  Psychiatric/Behavioral:  The patient is not nervous/anxious.      Objective:    Physical Exam Vitals and nursing note reviewed.  Constitutional:      Appearance: Normal appearance. He is well-developed.  HENT:     Head: Normocephalic and atraumatic.     Right Ear: Tympanic membrane, ear canal and external ear normal.     Left Ear: Tympanic membrane, ear canal and external ear normal.     Nose: Nose normal.     Mouth/Throat:     Mouth: Mucous membranes are moist.     Pharynx: Oropharynx is clear.  Eyes:     Extraocular Movements: Extraocular movements intact.     Conjunctiva/sclera: Conjunctivae normal.     Pupils: Pupils are equal, round, and reactive to light.  Neck:     Vascular: No carotid bruit.  Cardiovascular:     Rate and Rhythm: Normal rate and regular rhythm.     Pulses: Normal pulses.     Heart sounds: Normal heart sounds.  Pulmonary:     Effort: Pulmonary effort is normal.     Breath sounds: Normal breath sounds.  Abdominal:     General: Bowel  sounds are normal.     Palpations: Abdomen is soft.     Tenderness: There is no abdominal tenderness.  Musculoskeletal:        General: Normal range of motion.     Cervical back: Normal range of motion and neck supple.     Comments: The patient does have considerable weakness in the left foot.  Flexion and plantar flexion ability diminished from normal.  Patient does walk with a limp on the left side.   Lymphadenopathy:     Cervical: No cervical adenopathy.  Skin:    General: Skin is warm and dry.     Capillary Refill: Capillary refill takes less than 2 seconds.  Neurological:     General: No focal deficit present.     Mental Status: He is alert and oriented to person, place, and time.     Cranial Nerves: No cranial nerve deficit.     Sensory: No sensory deficit.     Motor: No weakness.     Coordination: Coordination normal.  Psychiatric:        Mood and Affect: Mood normal.        Behavior: Behavior normal.        Thought Content: Thought content normal.        Judgment: Judgment normal.    Today's Vitals   10/20/20 0925  BP: 110/61  Pulse: 62  Temp: 98.3 F (36.8 C)  SpO2: 96%  Weight: 179 lb 8 oz (81.4 kg)  Height: $Remove'5\' 7"'ZLYCOtr$  (1.702 m)   Body mass index is 28.11 kg/m.   Wt Readings from Last 3 Encounters:  10/20/20 179 lb 8 oz (81.4 kg)  10/13/20 177 lb (80.3 kg)  10/01/20 177 lb 3.2 oz (80.4  kg)     Health Maintenance Due  Topic Date Due   PNEUMOCOCCAL POLYSACCHARIDE VACCINE AGE 53-64 HIGH RISK  Never done   COVID-19 Vaccine (1) Never done   FOOT EXAM  Never done   OPHTHALMOLOGY EXAM  Never done   HIV Screening  Never done   Hepatitis C Screening  Never done   TETANUS/TDAP  Never done   COLONOSCOPY (Pts 45-8yrs Insurance coverage will need to be confirmed)  Never done   Zoster Vaccines- Shingrix (1 of 2) Never done   INFLUENZA VACCINE  10/26/2020    There are no preventive care reminders to display for this patient.  Lab Results  Component Value Date    TSH 2.520 09/29/2020   Lab Results  Component Value Date   WBC 4.8 09/29/2020   HGB 16.3 09/29/2020   HCT 48.9 09/29/2020   MCV 85 09/29/2020   PLT 177 09/29/2020   Lab Results  Component Value Date   NA 133 (L) 09/29/2020   K 4.4 09/29/2020   CO2 20 09/29/2020   GLUCOSE 273 (H) 09/29/2020   BUN 16 09/29/2020   CREATININE 1.01 09/29/2020   BILITOT 0.3 09/29/2020   ALKPHOS 113 09/29/2020   AST 35 09/29/2020   ALT 40 09/29/2020   PROT 6.9 09/29/2020   ALBUMIN 4.2 09/29/2020   CALCIUM 9.2 09/29/2020   ANIONGAP 10 12/04/2019   EGFR 84 09/29/2020   GFR 59.65 (L) 05/07/2013   Lab Results  Component Value Date   CHOL 282 (H) 09/29/2020   Lab Results  Component Value Date   HDL 28 (L) 09/29/2020   Lab Results  Component Value Date   LDLCALC 118 (H) 09/29/2020   Lab Results  Component Value Date   TRIG 743 (HH) 09/29/2020   Lab Results  Component Value Date   CHOLHDL 10.1 (H) 09/29/2020   Lab Results  Component Value Date   HGBA1C 11.5 (H) 09/29/2020      Assessment & Plan:  1. Encounter for general adult medical examination with abnormal findings Annual health maintenance exam today.  2. Diabetes mellitus type 2 in nonobese (HCC) Blood sugars very elevated with hemoglobin A1c at 11.0.  Increase Actos to 30 mg tablets daily.  Continue glyburide and metformin at current doses twice daily.  Encouraged him to monitor his blood sugars daily with a goal of fasting blood sugars to be between 70 and 120.  Will refer to ophthalmology for diabetic eye exam. - Ambulatory referral to Ophthalmology - pioglitazone (ACTOS) 30 MG tablet; Take 1 tablet (30 mg total) by mouth daily.  Dispense: 30 tablet; Refill: 2  3. Dyslipidemia Lipid panel very elevated despite being on atorvastatin, fenofibrate, and Zetia.  Would like to have him consult with the lipid clinic for further evaluation and treatment.  He should continue regular visits with cardiology as scheduled. - Ambulatory  referral to Cardiology  4. Coronary artery disease involving native coronary artery of native heart with angina pectoris [facility]) The patient should continue routine follow-ups with cardiology.  5. Screening for colon cancer Refer to GI provider for screening colonoscopy. - Ambulatory referral to Gastroenterology   Problem List Items Addressed This Visit       Cardiovascular and Mediastinum   Coronary artery disease     Endocrine   Diabetes mellitus type 2 in nonobese (HCC)   Relevant Medications   pioglitazone (ACTOS) 30 MG tablet   Other Relevant Orders   Ambulatory referral to Ophthalmology     Other  Dyslipidemia   Relevant Orders   Ambulatory referral to Cardiology   Encounter for general adult medical examination with abnormal findings - Primary   Screening for colon cancer   Relevant Orders   Ambulatory referral to Gastroenterology    Meds ordered this encounter  Medications   pioglitazone (ACTOS) 30 MG tablet    Sig: Take 1 tablet (30 mg total) by mouth daily.    Dispense:  30 tablet    Refill:  2    Please note increased dose to $Remov'30mg'HMgywZ$  daily.    Order Specific Question:   Supervising Provider    Answer:   Beatrice Lecher D [[id number]]    Follow-up: Return in about 3 months (around 01/20/2021) for diabetes with HgbA1c check - can we get medical records from [place] ([facility]. [facility]).   This note was dictated using Systems analyst. Rapid proofreading was performed to expedite the delivery of the information. Despite proofreading, phonetic errors will occur which are common with this voice recognition software. Please take this into consideration. If there are any concerns, please contact our office.    Ronnell Freshwater, NP

## 2020-10-24 ENCOUNTER — Other Ambulatory Visit: Payer: Self-pay

## 2020-10-24 ENCOUNTER — Ambulatory Visit (HOSPITAL_COMMUNITY)
Admission: RE | Admit: 2020-10-24 | Discharge: 2020-10-24 | Disposition: A | Discharge status: Home / Self Care | Payer: 59 | Source: Ambulatory Visit | Attending: Orthopedic Surgery | Admitting: Orthopedic Surgery

## 2020-10-24 DIAGNOSIS — M545 Low back pain, unspecified: Secondary | ICD-10-CM | POA: Diagnosis not present

## 2020-10-24 DIAGNOSIS — G8929 Other chronic pain: Secondary | ICD-10-CM | POA: Insufficient documentation

## 2020-10-24 DIAGNOSIS — M21372 Foot drop, left foot: Secondary | ICD-10-CM | POA: Insufficient documentation

## 2020-10-30 ENCOUNTER — Encounter: Payer: Self-pay | Admitting: Gastroenterology

## 2020-10-31 DIAGNOSIS — Z0001 Encounter for general adult medical examination with abnormal findings: Secondary | ICD-10-CM | POA: Insufficient documentation

## 2020-10-31 DIAGNOSIS — Z1211 Encounter for screening for malignant neoplasm of colon: Secondary | ICD-10-CM | POA: Insufficient documentation

## 2020-11-06 ENCOUNTER — Ambulatory Visit: Payer: Self-pay | Admitting: Physician Assistant

## 2020-11-09 ENCOUNTER — Telehealth: Payer: Self-pay | Admitting: Nurse Practitioner

## 2020-11-09 NOTE — Telephone Encounter (Signed)
This was short term only and patient has orthopedic provider he sees. Needs to come from them. I did have discussion that I could not prescribed this on routine basis. Did talk about spine specialist//pain management.

## 2020-11-09 NOTE — Telephone Encounter (Signed)
Per Herbert Seta advised patient the script would not be refilled. This was a one time script and that Primary Care will not manage long term use of Narcotics. Patient advised to contact orthopedic surgeon if requiring refill of medication for back pain. Patient was understanding. AS, CMA

## 2020-11-09 NOTE — Telephone Encounter (Signed)
Patient is requesting medication refill on the below medication.    HYDROcodone-acetaminophen (NORCO) 10-325 MG tablet [[id number]]    Order Details Dose: 1 tablet Route: Oral Frequency: Every 6 hours PRN  Dispense Quantity: 21 tablet Refills: 0   Indications of Use: Pain       Sig: Take 1 tablet by mouth every 6 (six) hours as needed. for pain       Start Date: 09/29/20 End Date: --  Written Date: 09/29/20 Expiration Date: 03/28/21  Earliest Fill Date: 09/29/20       Diagnosis Association: Lumbar disc disease with radiculopathy (M51.16)

## 2020-11-10 ENCOUNTER — Other Ambulatory Visit: Payer: Self-pay

## 2020-11-10 ENCOUNTER — Ambulatory Visit (INDEPENDENT_AMBULATORY_CARE_PROVIDER_SITE_OTHER): Payer: 59 | Admitting: Physician Assistant

## 2020-11-10 ENCOUNTER — Encounter: Payer: Self-pay | Admitting: Orthopedic Surgery

## 2020-11-10 DIAGNOSIS — M5116 Intervertebral disc disorders with radiculopathy, lumbar region: Secondary | ICD-10-CM | POA: Diagnosis not present

## 2020-11-10 MED ORDER — HYDROCODONE-ACETAMINOPHEN 5-325 MG PO TABS: 1.0000 | ORAL_TABLET | ORAL | 0 refills | Status: DC | PRN

## 2020-11-10 NOTE — Progress Notes (Signed)
Office Visit Note   Patient: Mario Arnold           Date of Birth: 12-22-1955           MRN: [id number] Visit Date: 11/10/2020              Requested by: Carlean Jews, NP [address] [place],  [place] [address] PCP: Carlean Jews, NP  Chief Complaint  Patient presents with   Lower Back - Follow-up    MRI review       HPI: Patient is a pleasant 65 year old gentleman with a history of lower back radiculopathy.  He did have an MRI a few years ago and received epidural steroid injections were helpful to him.  A couple weeks ago he came in to see me because he his pain had returned but more concerning to him was he had a several month history of weakness in his left foot with dorsiflexion.  It affects his gait.  Has milder symptoms on the right.  He denied any history of bowel or bladder control loss or any injury he is here to review his MRI today  Assessment & Plan: Visit Diagnoses:  1. Lumbar disc disease with radiculopathy     Plan: Plan we will refer to Dr. Ophelia Charter for evaluation given his foot drop.  Patient understands  Follow-Up Instructions: No follow-ups on file.   Ortho Exam  Patient is alert, oriented, no adenopathy, well-dressed, normal affect, normal respiratory effort. Examination stable exam from 2 weeks ago but he has only 2 out of 5 strength with dorsiflexion on the left and 3 out of 5 on the right.  MRI was reviewed.  Most the findings were stable from previous.  Big concern was changes at the L5-S1 level he had moderate to severe bilateral L5 foraminal stenosis increased on the left and this is in part related to a 5 to 6 mm synovial cyst projecting anteriorly.  Of note concern his hemoglobin A1c as of July 5 is 11.5  Imaging: No results found. No images are attached to the encounter.  Labs: Lab Results  Component Value Date   HGBA1C 11.5 (H) 09/29/2020   HGBA1C 10.8 (H) 05/17/2013     Lab Results  Component Value Date   ALBUMIN 4.2  09/29/2020   ALBUMIN 4.1 12/04/2019   ALBUMIN 4.4 05/07/2013    No results found for: MG No results found for: VD25OH  No results found for: PREALBUMIN CBC EXTENDED Latest Ref Rng & Units 09/29/2020 12/04/2019 12/04/2019  WBC 3.4 - 10.8 x10E3/uL 4.8 - 5.4  RBC 4.14 - 5.80 x10E6/uL 5.74 - 5.67  HGB 13.0 - 17.7 g/dL 63.8 46.6 59.9  HCT 35.7 - 51.0 % 48.9 50.0 50.9  PLT 150 - 450 x10E3/uL 177 - 191  NEUTROABS 1.4 - 7.0 x10E3/uL 2.4 - 3.0  LYMPHSABS 0.7 - 3.1 x10E3/uL 1.5 - 1.6     There is no height or weight on file to calculate BMI.  Orders:  No orders of the defined types were placed in this encounter.  Meds ordered this encounter  Medications   HYDROcodone-acetaminophen (NORCO/VICODIN) 5-325 MG tablet    Sig: Take 1 tablet by mouth every 4 (four) hours as needed for moderate pain.    Dispense:  30 tablet    Refill:  0     Procedures: No procedures performed  Clinical Data: No additional findings.  ROS:  All other systems negative, except as noted  in the HPI. Review of Systems  Objective: Vital Signs: There were no vitals taken for this visit.  Specialty Comments:  No specialty comments available.  PMFS History: Patient Active Problem List   Diagnosis Date Noted   Encounter for general adult medical examination with abnormal findings 10/31/2020   Screening for colon cancer 10/31/2020   Encounter to establish care 09/29/2020   Lumbar disc disease with radiculopathy 09/29/2020   Healthcare maintenance 09/29/2020   Screening for prostate cancer 09/29/2020   Carotid disease, bilateral (HCC) 10/06/2011   Thyroid nodule 10/06/2011   Coronary artery disease    Diabetes mellitus type 2 in nonobese [facility])    Dyslipidemia    History of transient ischemic attack (TIA)    Past Medical History:  Diagnosis Date   Arthritis    Carotid disease, bilateral (HCC) June 2013   less than 50% stenosis in the right and left    Chronic kidney disease 2017   stones   Coronary  artery disease    STATUS POST ANGIOPLASTY OF THE LAD    Diabetes mellitus    TYPE 2   Dyslipidemia    History of transient ischemic attack (TIA)    on aggrenox   Hyperlipidemia    Myocardial infarction [facility]) 1999   stent placement- per patient has 2 stents   Stroke [facility])    Thyroid nodule June 2013   had [place] with 4 mm small cystic nodule noted. Needs follow up US in 12 months. TSH is normal.     Family History  Problem Relation Age of Onset   Coronary artery disease Mother    Heart attack Mother    Diabetes Mother    Transient ischemic attack Mother    Heart disease Mother    Cancer Father     Past Surgical History:  Procedure Laterality Date   CORONARY ANGIOPLASTY WITH STENT PLACEMENT  2003   LAD. EJECTION FRACTION IS 45%   SHOULDER ARTHROSCOPY WITH OPEN ROTATOR CUFF REPAIR AND DISTAL CLAVICLE ACROMINECTOMY Right 09/05/2012   Procedure: RIGHT SHOULDER ARTHROSCOPY WITH LABRAL DEBRIDEMENT AND OPEN DISTAL CLAVICLE RESECTION, acromionectomy  AND OPEN ROTATOR CUFF REPAIR;  Surgeon: Drucilla Schmidt, MD;  Location: [facility];  Service: Orthopedics;  Laterality: Right;   Social History   Occupational History   Occupation: Repair semi trailers.  Tobacco Use   Smoking status: Former    Packs/day: 1.50    Years: 23.00    Pack years: 34.50    Types: Cigarettes    Quit date: 03/28/1997    Years since quitting: 23.6   Smokeless tobacco: Former    Types: Snuff, Chew   Tobacco comments:    as a teen  Vaping Use   Vaping Use: Never used  Substance and Sexual Activity   Alcohol use: Yes    Comment: socially   Drug use: No   Sexual activity: Not Currently

## 2020-11-12 ENCOUNTER — Other Ambulatory Visit: Payer: Self-pay

## 2020-11-12 ENCOUNTER — Ambulatory Visit (INDEPENDENT_AMBULATORY_CARE_PROVIDER_SITE_OTHER): Payer: 59 | Admitting: Pharmacist Clinician (PhC)/ Clinical Pharmacy Specialist

## 2020-11-12 DIAGNOSIS — E785 Hyperlipidemia, unspecified: Secondary | ICD-10-CM | POA: Diagnosis not present

## 2020-11-12 MED ORDER — OMEGA-3-ACID ETHYL ESTERS 1 G PO CAPS: 2.0000 g | ORAL_CAPSULE | Freq: Two times a day (BID) | ORAL | 3 refills | Status: DC

## 2020-11-12 NOTE — Assessment & Plan Note (Addendum)
Patient with elevated LDL and triglycerides.  LDL currently not at goal despite high intensity statin and ezetimibe use.  There is some question as to compliance over the past few months.  We discussed ways to better get his medications in every day and will move both of these to morning, with his other meds and breakfast.  At this point his insurance does not cover brand name medications at an affordable price.   Explained need for daily compliance to determine where LDL level truly is on this combination.    Triglycerides also elevated despite fenofibrate use.  Again, unsure how compliant he was prior to getting new insurance and PCP in late July.  Will see if he can get generic Lovaza thru insurance.  Vascepa does come with a copay card, unfortunately it covers the first 90 days at $9, then only takes $150/month off his copay.  Since we know his insurance poorly covers brand drugs, he would be unable to afford after 3 months.  He was also given Good Rx card, should the Lovaza not be covered, he can get it at [facility] for < $40/month.  It was stressed to patient that he needs to get better control of his DM, as his most recent A1c was 11.5 and this can have a negative impact on triglycerides.    Repeat labs in 2 months, if still not at goals, will need to consider PCSK-9 inhibitor.

## 2020-11-12 NOTE — Progress Notes (Signed)
11/12/2020 Mario Arnold 06-Jan-1956 [id number]   HPI:  Mario Arnold is a 65 y.o. male patient of Dr Martinique, who presents today for a lipid clinic evaluation.  See pertinent past medical history below.  Past Medical History: ASCVD MI, PCI to LAD - 2003; most recent cath showed 50% in stent restenosis as well as 70% stenosis of an intermediate branch  TIA In 2013 was noted that had TIA about 5-6 years prior  DM2 A1c 11.5 currently on glyburide 10 mg bid, metformin 500 mg qid, pioglitazone 30 mg qd  HTN Controlled today - on metoprolol 25 mg bid, losartan 25 mg bid    Current Medications: atorvastatin 80, ezetimibe 10, fenofibrate 160  Cholesterol Goals: LDL < 70, TG < 150   Intolerant/previously tried: none  Family history: mother with CAD, DM2, high trigs, died during CABG at 65; father unknown; several half siblings (mother) w/o heart issues;  2 children, no known issues  Diet: mix of eating out/home, likes [employer], restaurant style; not much for fast foods, rarely adds salt; mix of meats, vegetables (garden fresh); 2 eggs each morning with country ham most days.  Admits he eats what he wants and doesn't think about it.   Exercise:  legs/feet "don't work too well", retired for health reasons - back injections, now wobbly when he walks, low strength  Labs:  TC 282, TG 743, LDL 118, HDL 28   Current Outpatient Medications  Medication Sig Dispense Refill   acetaminophen (TYLENOL) 500 MG tablet Take 1,000 mg by mouth as needed for mild pain.      aspirin EC 81 MG tablet Take 1 tablet (81 mg total) by mouth daily. Swallow whole. 90 tablet 3   atorvastatin (LIPITOR) 80 MG tablet Take 1 tablet (80 mg total) by mouth daily. 90 tablet 1   blood glucose meter kit and supplies Dispense based on patient and insurance preference. Use up to four times daily as directed. (FOR ICD-10 E10.9, E11.9). 1 each 0   ezetimibe (ZETIA) 10 MG tablet Take 1 tablet (10 mg total) by mouth every evening. 90 tablet 1    fenofibrate 160 MG tablet Take 1 tablet (160 mg total) by mouth daily. 90 tablet 1   glyBURIDE (DIABETA) 5 MG tablet Take 2 tablets (10 mg total) by mouth 2 (two) times daily with a meal. 180 tablet 1   HYDROcodone-acetaminophen (NORCO/VICODIN) 5-325 MG tablet Take 1 tablet by mouth every 4 (four) hours as needed for moderate pain. 30 tablet 0   isosorbide mononitrate (IMDUR) 60 MG 24 hr tablet Take 1 tablet (60 mg total) by mouth daily. 90 tablet 0   losartan (COZAAR) 25 MG tablet Take 1 tablet (25 mg total) by mouth daily. 90 tablet 1   metFORMIN (GLUCOPHAGE) 500 MG tablet Take 1 tablet (500 mg total) by mouth 4 (four) times daily. Two in am and 2 in pm 360 tablet 1   metoprolol tartrate (LOPRESSOR) 25 MG tablet Take 1 tablet (25 mg total) by mouth 2 (two) times daily. 180 tablet 1   nitroGLYCERIN (NITROSTAT) 0.4 MG SL tablet Place 1 tablet (0.4 mg total) under the tongue every 5 (five) minutes as needed. 25 tablet 6   omega-3 acid ethyl esters (LOVAZA) 1 g capsule Take 2 capsules (2 g total) by mouth 2 (two) times daily. 120 capsule 3   pioglitazone (ACTOS) 30 MG tablet Take 1 tablet (30 mg total) by mouth daily. 30 tablet 2   No current facility-administered medications  for this visit.    Allergies  Allergen Reactions   Hydromorphone Nausea And Vomiting    Past Medical History:  Diagnosis Date   Arthritis    Carotid disease, bilateral ([place]) June 2013   less than 50% stenosis in the right and left    Chronic kidney disease 2017   stones   Coronary artery disease    STATUS POST ANGIOPLASTY OF THE LAD    Diabetes mellitus    TYPE 2   Dyslipidemia    History of transient ischemic attack (TIA)    on aggrenox   Hyperlipidemia    Myocardial infarction [facility]) 1999   stent placement- per patient has 2 stents   Stroke [facility])    Thyroid nodule June 2013   had [place] with 4 mm small cystic nodule noted. Needs follow up US in 12 months. TSH is normal.     Blood pressure 128/82, pulse 63,  resp. rate 14, height $RemoveBe'5\' 7"'MqUyMWUuy$  (1.702 m), weight 182 lb (82.6 kg), SpO2 95 %.   Dyslipidemia Patient with elevated LDL and triglycerides.  LDL currently not at goal despite high intensity statin and ezetimibe use.  There is some question as to compliance over the past few months.  We discussed ways to better get his medications in every day and will move both of these to morning, with his other meds and breakfast.  At this point his insurance does not cover brand name medications at an affordable price.   Explained need for daily compliance to determine where LDL level truly is on this combination.    Triglycerides also elevated despite fenofibrate use.  Again, unsure how compliant he was prior to getting new insurance and PCP in late July.  Will see if he can get generic Lovaza thru insurance.  Vascepa does come with a copay card, unfortunately it covers the first 90 days at $9, then only takes $150/month off his copay.  Since we know his insurance poorly covers brand drugs, he would be unable to afford after 3 months.  He was also given Good Rx card, should the Lovaza not be covered, he can get it at [facility] for < $40/month.  It was stressed to patient that he needs to get better control of his DM, as his most recent A1c was 11.5 and this can have a negative impact on triglycerides.    Repeat labs in 2 months, if still not at goals, will need to consider PCSK-9 inhibitor.     Tommy Medal PharmD CPP [facility] [address] [place],  [address] [phone]

## 2020-11-12 NOTE — Patient Instructions (Addendum)
Your Results:             Your most recent labs Goal  Total Cholesterol 282 < 200  Triglycerides 743 < 150  HDL (happy/good cholesterol) 28 > 40  LDL (lousy/bad cholesterol 118 < 70      Medication changes:  Move atorvastatin, ezetimibe and fenofibrate to mornings.  Start (if affordable), omega 3 oil - 2 capsules twice daily  Lab orders:  We will have you repeat cholesterol labs in 2 months to look for improvement.  High Triglycerides Eating Plan Triglycerides are a type of fat in the blood. High levels of triglycerides can increase your risk of heart disease and stroke. If your triglyceride levels are high, choosing the right foods can help lower your triglycerides and keep your heart healthy. Work with your health care provider or a diet and nutrition specialist (dietitian) to develop an eating plan that is right for you. What are tips for following this plan? General guidelines  Lose weight, if you are overweight. For most people, losing 5-10 lbs (2-5 kg) helps lower triglyceride levels. A weight-loss plan may include. 30 minutes of exercise at least 5 days a week. Reducing the amount of calories, sugar, and fat you eat. Eat a wide variety of fresh fruits, vegetables, and whole grains. These foods are high in fiber. Eat foods that contain healthy fats, such as fatty fish, nuts, seeds, and olive oil. Avoid foods that are high in added sugar, added salt (sodium), saturated fat, and trans fat. Avoid low-fiber, refined carbohydrates such as white bread, crackers, noodles, and white rice. Avoid foods with partially hydrogenated oils (trans fats), such as fried foods or stick margarine. Limit alcohol intake to no more than 1 drink a day for nonpregnant women and 2 drinks a day for men. One drink equals 12 oz of beer, 5 oz of wine, or 1 oz of hard liquor. Your health care provider may recommend that you drink less depending on your overall health.  Reading food labels Check food labels  for the amount of saturated fat. Choose foods with no or very little saturated fat. Check food labels for the amount of trans fat. Choose foods with no trans fat. Check food labels for the amount of cholesterol. Choose foods low in cholesterol. Ask your dietitian how much cholesterol you should have each day. Check food labels for the amount of sodium. Choose foods with less than 140 milligrams (mg) per serving. Shopping Buy dairy products labeled as nonfat (skim) or low-fat (1%). Avoid buying processed or prepackaged foods. These are often high in added sugar, sodium, and fat. Cooking Choose healthy fats when cooking, such as olive oil or canola oil. Cook foods using lower fat methods, such as baking, broiling, boiling, or grilling. Make your own sauces, dressings, and marinades when possible, instead of buying them. Store-bought sauces, dressings, and marinades are often high in sodium and sugar. Meal planning Eat more home-cooked food and less restaurant, buffet, and fast food. Eat fatty fish at least 2 times each week. Examples of fatty fish include salmon, trout, mackerel, tuna, and herring. If you eat whole eggs, do not eat more than 3 egg yolks per week. What foods are recommended? The items listed may not be a complete list. Talk with your dietitian aboutwhat dietary choices are best for you. Grains Whole wheat or whole grain breads, crackers, cereals, and pasta. Unsweetenedoatmeal. Bulgur. Barley. Quinoa. Brown rice. Whole wheat flour tortillas. Vegetables Fresh or frozen vegetables. Low-sodium canned vegetables.  Fruits All fresh, canned (in natural juice), or frozen fruits. Meats and other protein foods Skinless chicken or [place]. Ground chicken or [place]. Lean cuts of pork, trimmed of fat. Fish and seafood, especially salmon, trout, and herring. Egg whites. Dried beans, peas, or lentils. Unsalted nuts or seeds. Unsalted cannedbeans. Natural peanut or almond butter. Dairy Low-fat  dairy products. Skim or low-fat (1%) milk. Reduced fat (2%) and low-sodium cheese. Low-fat ricotta cheese. Low-fat cottage cheese. Plain,low-fat yogurt. Fats and oils Tub margarine without trans fats. Light or reduced-fat mayonnaise. Light or reduced-fat salad dressings.Avocado. Safflower, olive, sunflower, soybean, and canola oils. What foods are not recommended? The items listed may not be a complete list. Talk with your dietitian aboutwhat dietary choices are best for you. Grains White bread. White (regular) pasta. White rice. Cornbread. Bagels. Pastries. Crackers that contain trans fat. Vegetables Creamed or fried vegetables. Vegetables in a cheese sauce. Fruits Sweetened dried fruit. Canned fruit in syrup. Fruit juice. Meats and other protein foods Fatty cuts of meat. Ribs. Chicken wings. Tomasa Blase. Sausage. Bologna. Salami.Chitterlings. Fatback. Hot dogs. Bratwurst. Packaged lunch meats. Dairy Whole or reduced-fat (2%) milk. Half-and-half. Cream cheese. Full-fat or sweetened yogurt. Full-fat cheese. Nondairy creamers. Whipped toppings.Processed cheese or cheese spreads. Cheese curds. Beverages Alcohol. Sweetened drinks, such as soda, lemonade, fruit drinks, or punches. Fats and oils Butter. Stick margarine. Lard. Shortening. Ghee. Bacon fat. Tropical oils, suchas coconut, palm kernel, or palm oils. Sweets and desserts Corn syrup. Sugars. Honey. Molasses. Candy. Jam and jelly. Syrup. Sweetenedcereals. Cookies. Pies. Cakes. Donuts. Muffins. Ice cream. Condiments Store-bought sauces, dressings, and marinades that are high in sugar, such asketchup and barbecue sauce. Summary High levels of triglycerides can increase the risk of heart disease and stroke. Choosing the right foods can help lower your triglycerides. Eat plenty of fresh fruits, vegetables, and whole grains. Choose low-fat dairy and lean meats. Eat fatty fish at least twice a week. Avoid processed and prepackaged foods with added  sugar, sodium, saturated fat, and trans fat. If you need suggestions or have questions about what types of food are good for you, talk with your health care provider or a dietitian. This information is not intended to replace advice given to you by your health care provider. Make sure you discuss any questions you have with your healthcare provider. Document Revised: 07/15/2019 Document Reviewed: 07/17/2019 Elsevier Patient Education  2022 [employer].   Thank you for choosing [facility] [employer]

## 2020-11-12 NOTE — Assessment & Plan Note (Signed)
>>  ASSESSMENT AND PLAN FOR DYSLIPIDEMIA WRITTEN ON 11/12/2020  1:47 PM BY ALVSTAD, KRISTIN L, RPH-CPP  Patient with elevated LDL and triglycerides.  LDL currently not at goal despite high intensity statin and ezetimibe use.  There is some question as to compliance over the past few months.  We discussed ways to better get his medications in every day and will move both of these to morning, with his other meds and breakfast.  At this point his insurance does not cover brand name medications at an affordable price.   Explained need for daily compliance to determine where LDL level truly is on this combination.    Triglycerides also elevated despite fenofibrate use.  Again, unsure how compliant he was prior to getting new insurance and PCP in late July.  Will see if he can get generic Lovaza thru insurance.  Vascepa does come with a copay card, unfortunately it covers the first 90 days at $9, then only takes $150/month off his copay.  Since we know his insurance poorly covers brand drugs, he would be unable to afford after 3 months.  He was also given Good Rx card, should the Lovaza not be covered, he can get it at [facility] for < $40/month.  It was stressed to patient that he needs to get better control of his DM, as his most recent A1c was 11.5 and this can have a negative impact on triglycerides.    Repeat labs in 2 months, if still not at goals, will need to consider PCSK-9 inhibitor.

## 2020-11-13 ENCOUNTER — Other Ambulatory Visit: Payer: Self-pay

## 2020-11-13 ENCOUNTER — Ambulatory Visit (AMBULATORY_SURGERY_CENTER): Payer: Self-pay | Admitting: *Deleted

## 2020-11-13 VITALS — Ht 67.0 in | Wt 177.0 lb

## 2020-11-13 DIAGNOSIS — Z1211 Encounter for screening for malignant neoplasm of colon: Secondary | ICD-10-CM

## 2020-11-13 MED ORDER — PEG-KCL-NACL-NASULF-NA ASC-C 100 G PO SOLR: 1.0000 | Freq: Once | ORAL | 0 refills | Status: AC

## 2020-11-13 NOTE — Progress Notes (Signed)
Patient is here in-person for PV. Patient denies any allergies to eggs or soy. Patient denies any problems with anesthesia/sedation. Patient denies any oxygen use at home. Patient denies taking any diet/weight loss medications or blood thinners. Patient is aware of our care-partner policy and Covid-19 safety protocol.   EMMI education assigned to the patient for the procedure, sent to MyChart.    

## 2020-11-18 ENCOUNTER — Telehealth: Payer: Self-pay | Admitting: Orthopaedic Surgery

## 2020-11-18 NOTE — Telephone Encounter (Signed)
Patient called requesting a call back. Pt states his last visit was with PA Persons that wanted him to see Dr. Ophelia Charter. Was unsure if pt need to be put in as new patient for Ophelia Charter and pt put in new back slot. Please call pt to set this appt with Dr. Ophelia Charter. Pt phone number is [phone].

## 2020-11-18 NOTE — Telephone Encounter (Signed)
This is the patient that was originally sent to you as a staff message. Dr. Ophelia Charter feels that patient will most likely need multi level fusion and wanted for patient to see Dr. Otelia Sergeant instead of him.  He has originally been seen by [place]. Can you tell me if you have had a chance to schedule patient? If not and you give me a time, I will be glad to call patient and schedule. Thanks.

## 2020-11-18 NOTE — Telephone Encounter (Signed)
Scheduled for 12/31/20 @ 10:30, added to cancellation list

## 2020-11-20 ENCOUNTER — Telehealth: Payer: Self-pay

## 2020-11-20 NOTE — Telephone Encounter (Signed)
**Note De-Identified Aften Lipsey Obfuscation** Vikash Donnelly [employer] - PA Case ID: [id number] - Rx #: [id number] Outcome: N/A today Final Outcome has been reached on this event. Previous decision cannot be edited. Drug: Omega-3-acid Ethyl Esters 1GM capsules Form: [employer] Electronic Prior Authorization  Omega-3-ethyl esters is not covered by the pts plan, He was given a GoodRx card at his visit with PharmD on 8/18 to use if ins did not cover.

## 2020-11-20 NOTE — Telephone Encounter (Signed)
**Note De-Identified Latonia Conrow Obfuscation** Omega-3-ethyl esters PA started through covermymeds. Key: [id number]

## 2020-11-23 ENCOUNTER — Ambulatory Visit: Payer: 59 | Admitting: Nurse Practitioner

## 2020-11-26 ENCOUNTER — Ambulatory Visit: Payer: 59 | Admitting: Nurse Practitioner

## 2020-12-03 ENCOUNTER — Encounter: Payer: Self-pay | Admitting: Gastroenterology

## 2020-12-03 ENCOUNTER — Ambulatory Visit (AMBULATORY_SURGERY_CENTER): Payer: 59 | Admitting: Gastroenterology

## 2020-12-03 ENCOUNTER — Other Ambulatory Visit: Payer: Self-pay

## 2020-12-03 VITALS — BP 104/68 | HR 55 | Temp 98.0°F | Resp 16 | Ht 67.0 in | Wt 177.0 lb

## 2020-12-03 DIAGNOSIS — Z1211 Encounter for screening for malignant neoplasm of colon: Secondary | ICD-10-CM | POA: Diagnosis present

## 2020-12-03 MED ORDER — SODIUM CHLORIDE 0.9 % IV SOLN: 500.0000 mL | Freq: Once | INTRAVENOUS | Status: DC

## 2020-12-03 NOTE — Progress Notes (Signed)
To PACU, VSS. Report to [email or web address].tb 

## 2020-12-03 NOTE — Patient Instructions (Signed)
YOU HAD AN ENDOSCOPIC PROCEDURE TODAY AT THE [facility]:   Refer to the procedure report that was given to you for any specific questions about what was found during the examination.  If the procedure report does not answer your questions, please call your gastroenterologist to clarify.  If you requested that your care partner not be given the details of your procedure findings, then the procedure report has been included in a sealed envelope for you to review at your convenience later. ° °YOU SHOULD EXPECT: Some feelings of bloating in the abdomen. Passage of more gas than usual.  Walking can help get rid of the air that was put into your GI tract during the procedure and reduce the bloating. If you had a lower endoscopy (such as a colonoscopy or flexible sigmoidoscopy) you may notice spotting of blood in your stool or on the toilet paper. If you underwent a bowel prep for your procedure, you may not have a normal bowel movement for a few days. ° °Please Note:  You might notice some irritation and congestion in your nose or some drainage.  This is from the oxygen used during your procedure.  There is no need for concern and it should clear up in a day or so. ° °SYMPTOMS TO REPORT IMMEDIATELY: ° °Following lower endoscopy (colonoscopy or flexible sigmoidoscopy): ° Excessive amounts of blood in the stool ° Significant tenderness or worsening of abdominal pains ° Swelling of the abdomen that is new, acute ° Fever of 100°F or higher ° °For urgent or emergent issues, a gastroenterologist can be reached at any hour by calling [phone]. °Do not use MyChart messaging for urgent concerns.  ° ° °DIET:  We do recommend a small meal at first, but then you may proceed to your regular diet.  Drink plenty of fluids but you should avoid alcoholic beverages for 24 hours. ° °ACTIVITY:  You should plan to take it easy for the rest of today and you should NOT DRIVE or use heavy machinery until tomorrow (because of  the sedation medicines used during the test).   ° °FOLLOW UP: °Our staff will call the number listed on your records 48-72 hours following your procedure to check on you and address any questions or concerns that you may have regarding the information given to you following your procedure. If we do not reach you, we will leave a message.  We will attempt to reach you two times.  During this call, we will ask if you have developed any symptoms of COVID 19. If you develop any symptoms (ie: fever, flu-like symptoms, shortness of breath, cough etc.) before then, please call [phone].  If you test positive for Covid 19 in the 2 weeks post procedure, please call and report this information to us.   ° °SIGNATURES/CONFIDENTIALITY: °You and/or your care partner have signed paperwork which will be entered into your electronic medical record.  These signatures attest to the fact that that the information above on your After Visit Summary has been reviewed and is understood.  Full responsibility of the confidentiality of this discharge information lies with you and/or your care-partner.  °

## 2020-12-03 NOTE — Progress Notes (Signed)
Pt's states no medical or surgical changes since previsit or office visit. VS assessed by C.W 

## 2020-12-03 NOTE — Progress Notes (Signed)
[facility] History and Physical   Primary Care Physician:  Ronnell Freshwater, NP   Reason for Procedure:   Colon cancer screening  Plan:    Screening colonoscopy     HPI: Mario Arnold is a 65 y.o. male with a history of CKD, CAD s/p LAD angioplasty, and DM undergoing average risk screening colonoscopy.  He had a normal colonoscopy 13 years ago.  He has no family history of colon cancer and no chronic GI symptoms.   Past Medical History:  Diagnosis Date   Arthritis    Carotid disease, bilateral ([place]) June 2013   less than 50% stenosis in the right and left    Chronic kidney disease 2017   stones   Coronary artery disease    STATUS POST ANGIOPLASTY OF THE LAD    Diabetes mellitus    TYPE 2   Dyslipidemia    History of transient ischemic attack (TIA)    on aggrenox   Hyperlipidemia    Myocardial infarction [facility]) 1999   stent placement- per patient has 2 stents   Stroke [facility])    Thyroid nodule June 2013   had [place] with 4 mm small cystic nodule noted. Needs follow up US in 12 months. TSH is normal.     Past Surgical History:  Procedure Laterality Date   COLONOSCOPY     13 years ago in [facility] normal exam per pt   CORONARY ANGIOPLASTY WITH STENT PLACEMENT  03/28/2001   LAD. EJECTION FRACTION IS 45%   SHOULDER ARTHROSCOPY WITH OPEN ROTATOR CUFF REPAIR AND DISTAL CLAVICLE ACROMINECTOMY Right 09/05/2012   Procedure: RIGHT SHOULDER ARTHROSCOPY WITH LABRAL DEBRIDEMENT AND OPEN DISTAL CLAVICLE RESECTION, acromionectomy  AND OPEN ROTATOR CUFF REPAIR;  Surgeon: Magnus Sinning, MD;  Location: [facility];  Service: Orthopedics;  Laterality: Right;    Prior to Admission medications   Medication Sig Start Date End Date Taking? Authorizing Provider  aspirin EC 81 MG tablet Take 1 tablet (81 mg total) by mouth daily. Swallow whole. 10/01/20  Yes Martinique, Peter M, MD  atorvastatin (LIPITOR) 80 MG tablet Take 1 tablet (80 mg total) by mouth daily. 09/29/20  Yes Ronnell Freshwater, NP  blood glucose meter kit and supplies Dispense based on patient and insurance preference. Use up to four times daily as directed. (FOR ICD-10 E10.9, E11.9). 09/29/20  Yes Boscia, Heather E, NP  ezetimibe (ZETIA) 10 MG tablet Take 1 tablet (10 mg total) by mouth every evening. 09/29/20  Yes Boscia, Greer Ee, NP  fenofibrate 160 MG tablet Take 1 tablet (160 mg total) by mouth daily. 09/29/20  Yes Ronnell Freshwater, NP  glyBURIDE (DIABETA) 5 MG tablet Take 2 tablets (10 mg total) by mouth 2 (two) times daily with a meal. 09/29/20  Yes Boscia, Heather E, NP  HYDROcodone-acetaminophen (NORCO/VICODIN) 5-325 MG tablet Take 1 tablet by mouth every 4 (four) hours as needed for moderate pain. 11/10/20  Yes Persons, Bevely Palmer, PA  isosorbide mononitrate (IMDUR) 60 MG 24 hr tablet Take 1 tablet (60 mg total) by mouth daily. 10/06/20  Yes Boscia, Greer Ee, NP  losartan (COZAAR) 25 MG tablet Take 1 tablet (25 mg total) by mouth daily. 09/29/20  Yes Boscia, Greer Ee, NP  metFORMIN (GLUCOPHAGE) 500 MG tablet Take 1 tablet (500 mg total) by mouth 4 (four) times daily. Two in am and 2 in pm 09/29/20  Yes Boscia, Heather E, NP  metoprolol tartrate (LOPRESSOR) 25 MG tablet Take 1 tablet (25 mg  total) by mouth 2 (two) times daily. 09/29/20  Yes Boscia, Greer Ee, NP  omega-3 acid ethyl esters (LOVAZA) 1 g capsule Take 2 capsules (2 g total) by mouth 2 (two) times daily. 11/12/20  Yes Martinique, Peter M, MD  pioglitazone (ACTOS) 30 MG tablet Take 1 tablet (30 mg total) by mouth daily. 10/20/20  Yes Ronnell Freshwater, NP  acetaminophen (TYLENOL) 500 MG tablet Take 1,000 mg by mouth as needed for mild pain.     [provider]  nitroGLYCERIN (NITROSTAT) 0.4 MG SL tablet Place 1 tablet (0.4 mg total) under the tongue every 5 (five) minutes as needed. Patient not taking: No sig reported 05/07/13   Burtis Junes, NP    Current Outpatient Medications  Medication Sig Dispense Refill   aspirin EC 81 MG tablet Take 1  tablet (81 mg total) by mouth daily. Swallow whole. 90 tablet 3   atorvastatin (LIPITOR) 80 MG tablet Take 1 tablet (80 mg total) by mouth daily. 90 tablet 1   blood glucose meter kit and supplies Dispense based on patient and insurance preference. Use up to four times daily as directed. (FOR ICD-10 E10.9, E11.9). 1 each 0   ezetimibe (ZETIA) 10 MG tablet Take 1 tablet (10 mg total) by mouth every evening. 90 tablet 1   fenofibrate 160 MG tablet Take 1 tablet (160 mg total) by mouth daily. 90 tablet 1   glyBURIDE (DIABETA) 5 MG tablet Take 2 tablets (10 mg total) by mouth 2 (two) times daily with a meal. 180 tablet 1   HYDROcodone-acetaminophen (NORCO/VICODIN) 5-325 MG tablet Take 1 tablet by mouth every 4 (four) hours as needed for moderate pain. 30 tablet 0   isosorbide mononitrate (IMDUR) 60 MG 24 hr tablet Take 1 tablet (60 mg total) by mouth daily. 90 tablet 0   losartan (COZAAR) 25 MG tablet Take 1 tablet (25 mg total) by mouth daily. 90 tablet 1   metFORMIN (GLUCOPHAGE) 500 MG tablet Take 1 tablet (500 mg total) by mouth 4 (four) times daily. Two in am and 2 in pm 360 tablet 1   metoprolol tartrate (LOPRESSOR) 25 MG tablet Take 1 tablet (25 mg total) by mouth 2 (two) times daily. 180 tablet 1   omega-3 acid ethyl esters (LOVAZA) 1 g capsule Take 2 capsules (2 g total) by mouth 2 (two) times daily. 120 capsule 3   pioglitazone (ACTOS) 30 MG tablet Take 1 tablet (30 mg total) by mouth daily. 30 tablet 2   acetaminophen (TYLENOL) 500 MG tablet Take 1,000 mg by mouth as needed for mild pain.      nitroGLYCERIN (NITROSTAT) 0.4 MG SL tablet Place 1 tablet (0.4 mg total) under the tongue every 5 (five) minutes as needed. (Patient not taking: No sig reported) 25 tablet 6   Current Facility-Administered Medications  Medication Dose Route Frequency Provider Last Rate Last Admin   0.9 %  sodium chloride infusion  500 mL Intravenous Once Daryel November, MD        Allergies as of 12/03/2020 -  Review Complete 12/03/2020  Allergen Reaction Noted   Hydromorphone Nausea And Vomiting 09/06/2012    Family History  Problem Relation Age of Onset   Coronary artery disease Mother    Heart attack Mother    Diabetes Mother    Transient ischemic attack Mother    Heart disease Mother    Cancer Father    Colon cancer Neg Hx    Esophageal cancer Neg Hx  Colon polyps Neg Hx    Rectal cancer Neg Hx    Stomach cancer Neg Hx     Social History   Socioeconomic History   Marital status: Married    Spouse name: Ingram Onnen   Number of children: 2   Years of education: Not on file   Highest education level: Not on file  Occupational History   Occupation: Repair semi trailers.  Tobacco Use   Smoking status: Former    Packs/day: 1.50    Years: 23.00    Pack years: 34.50    Types: Cigarettes    Quit date: 03/28/1997    Years since quitting: 23.7   Smokeless tobacco: Former    Types: Snuff, Chew   Tobacco comments:    as a teen  Vaping Use   Vaping Use: Never used  Substance and Sexual Activity   Alcohol use: Yes    Alcohol/week: 2.0 standard drinks    Types: 2 Cans of beer per week    Comment: socially   Drug use: No   Sexual activity: Not Currently  Other Topics Concern   Not on file  Social History Narrative   Not on file   Social Determinants of Health   Financial Resource Strain: Not on file  Food Insecurity: Not on file  Transportation Needs: Not on file  Physical Activity: Not on file  Stress: Not on file  Social Connections: Not on file  Intimate Partner Violence: Not on file    Review of Systems:  All other review of systems negative except as mentioned in the HPI.  Physical Exam: Vital signs BP 127/62   Pulse (!) 56   Temp 98 F (36.7 C) (Skin)   Ht _0  (1.702 m)   Wt 177 lb (80.3 kg)   SpO2 98%   BMI 27.72 kg/m   General:   Alert,  Well-developed, well-nourished, pleasant and cooperative in NAD, MP2 Lungs:  Clear throughout to  auscultation.   Heart:  Regular rate and rhythm; no murmurs, clicks, rubs,  or gallops. Abdomen:  Soft, nontender and nondistended. Normal bowel sounds.   Neuro/Psych:  Alert and cooperative. Normal mood and affect. A and O x 3   Gracelynne Benedict E. Candis Schatz, MD [facility]

## 2020-12-03 NOTE — Op Note (Signed)
[facility] Patient Name: Mario Arnold Procedure Date: 12/03/2020 8:47 AM MRN: [id number] Endoscopist: Lorin Picket E. Tomasa Rand , MD Age: 65 Referring MD:  Date of Birth: 1955/08/05 Gender: Male Account #: [id number] Procedure:                Colonoscopy Indications:              Screening for colorectal malignant neoplasm, normal                            colonoscopy 13 years ago Medicines:                Monitored Anesthesia Care Procedure:                Pre-Anesthesia Assessment:                           - Prior to the procedure, a History and Physical                            was performed, and patient medications and                            allergies were reviewed. The patient's tolerance of                            previous anesthesia was also reviewed. The risks                            and benefits of the procedure and the sedation                            options and risks were discussed with the patient.                            All questions were answered, and informed consent                            was obtained. Prior Anticoagulants: The patient has                            taken no previous anticoagulant or antiplatelet                            agents except for aspirin. ASA Grade Assessment:                            III - A patient with severe systemic disease. After                            reviewing the risks and benefits, the patient was                            deemed in satisfactory condition to undergo the  procedure.                           After obtaining informed consent, the colonoscope                            was passed under direct vision. Throughout the                            procedure, the patient's blood pressure, pulse, and                            oxygen saturations were monitored continuously. The                            CF [id number] #[id number] was introduced through the anus                             and advanced to the the terminal ileum, with                            identification of the appendiceal orifice and IC                            valve. The colonoscopy was performed without                            difficulty. The patient tolerated the procedure                            well. The quality of the bowel preparation was                            excellent. The terminal ileum, ileocecal valve,                            appendiceal orifice, and rectum were photographed. Scope In: 8:58:22 AM Scope Out: 9:11:01 AM Scope Withdrawal Time: 0 hours 8 minutes 36 seconds  Total Procedure Duration: 0 hours 12 minutes 39 seconds  Findings:                 The perianal and digital rectal examinations were                            normal. Pertinent negatives include normal                            sphincter tone and no palpable rectal lesions.                           The colon (entire examined portion) appeared normal.                           The terminal ileum appeared normal.  The retroflexed view of the distal rectum and anal                            verge was normal and showed no anal or rectal                            abnormalities. Complications:            No immediate complications. Estimated Blood Loss:     Estimated blood loss: none. Impression:               - The entire examined colon is normal.                           - The examined portion of the ileum was normal.                           - The distal rectum and anal verge are normal on                            retroflexion view.                           - No specimens collected. Recommendation:           - Patient has a contact number available for                            emergencies. The signs and symptoms of potential                            delayed complications were discussed with the                            patient. Return to normal activities  tomorrow.                            Written discharge instructions were provided to the                            patient.                           - Resume previous diet.                           - Continue present medications.                           - Repeat colonoscopy in 10 years for screening                            purposes. Cherrelle Plante E. Tomasa Rand, MD 12/03/2020 9:14:52 AM This report has been signed electronically.

## 2020-12-07 ENCOUNTER — Telehealth: Payer: Self-pay | Admitting: *Deleted

## 2020-12-07 NOTE — Telephone Encounter (Signed)
  Follow up Call-  Call back number 12/03/2020  Post procedure Call Back phone  # [phone]  Permission to leave phone message Yes  Some recent data might be hidden     Patient questions:  Do you have a fever, pain , or abdominal swelling? No. Pain Score  0 *  Have you tolerated food without any problems? Yes.    Have you been able to return to your normal activities? Yes.    Do you have any questions about your discharge instructions: Diet   No. Medications  No. Follow up visit  No.  Do you have questions or concerns about your Care? No.  Actions: * If pain score is 4 or above: No action needed, pain <4.  Have you developed a fever since your procedure? no  2.   Have you had an respiratory symptoms (SOB or cough) since your procedure? no  3.   Have you tested positive for COVID 19 since your procedure no  4.   Have you had any family members/close contacts diagnosed with the COVID 19 since your procedure?  no   If yes to any of these questions please route to Laverna Peace, RN and Karlton Lemon, RN

## 2020-12-07 NOTE — Telephone Encounter (Signed)
Follow up call made. 

## 2020-12-31 ENCOUNTER — Ambulatory Visit: Payer: 59 | Admitting: Specialist

## 2021-01-14 ENCOUNTER — Ambulatory Visit (INDEPENDENT_AMBULATORY_CARE_PROVIDER_SITE_OTHER): Payer: 59 | Admitting: Specialist

## 2021-01-14 ENCOUNTER — Other Ambulatory Visit: Payer: Self-pay

## 2021-01-14 ENCOUNTER — Encounter: Payer: Self-pay | Admitting: Specialist

## 2021-01-14 VITALS — BP 131/76 | HR 63 | Ht 67.0 in | Wt 185.0 lb

## 2021-01-14 DIAGNOSIS — Z794 Long term (current) use of insulin: Secondary | ICD-10-CM | POA: Diagnosis not present

## 2021-01-14 DIAGNOSIS — M48062 Spinal stenosis, lumbar region with neurogenic claudication: Secondary | ICD-10-CM | POA: Diagnosis not present

## 2021-01-14 DIAGNOSIS — R29898 Other symptoms and signs involving the musculoskeletal system: Secondary | ICD-10-CM

## 2021-01-14 DIAGNOSIS — E1142 Type 2 diabetes mellitus with diabetic polyneuropathy: Secondary | ICD-10-CM

## 2021-01-14 MED ORDER — HYDROCODONE-ACETAMINOPHEN 7.5-325 MG PO TABS: 1.0000 | ORAL_TABLET | Freq: Four times a day (QID) | ORAL | 0 refills | Status: DC | PRN

## 2021-01-14 MED ORDER — GABAPENTIN 100 MG PO CAPS: 100.0000 mg | ORAL_CAPSULE | Freq: Every day | ORAL | 2 refills | Status: DC

## 2021-01-14 MED ORDER — DICLOFENAC POTASSIUM 50 MG PO TABS: 50.0000 mg | ORAL_TABLET | Freq: Three times a day (TID) | ORAL | 2 refills | Status: DC

## 2021-01-14 NOTE — Progress Notes (Signed)
Office Visit Note   Patient: Mario Arnold           Date of Birth: [birth year]           MRN: [id number] Visit Date: 01/14/2021              Requested by: Carlean Jews, NP [address] [place],  [place] [address] PCP: Carlean Jews, NP   Assessment & Plan: Visit Diagnoses:  1. Spinal stenosis of lumbar region with neurogenic claudication   2. Bilateral leg weakness   3. Type 2 diabetes mellitus with diabetic polyneuropathy, with long-term current use of insulin (HCC)     Plan: Avoid bending, stooping and avoid lifting weights greater than 10 lbs. Avoid prolong standing and walking. Order for a new walker with wheels. Surgery scheduling secretary Tivis Ringer, will call you in the next week to schedule for surgery.  Surgery recommended is a L2-3,L3-4, L4-5 and L5-S1 level lumbar laminectomy this would be done with operating room microscope. Take hydrocodone for for pain. Risk of surgery includes risk of infection 1 in 100 patients, bleeding 1/2% chance you would need a transfusion.   Risk to the nerves is one in 10,000. Recommend that you have your diabetes treated and surgery will be done when your HbA1c is less than 8.0. Please see your cardiologist Dr. Swaziland for preoperative evaluation.  Expect relief of leg pain but numbness may persist depending on the length and degree of pressure that has been present. Gabapentin for nerve pain. Diclofenac 50 mg po up to TID for arthritis. Hydrocodone for chronic nerve pain and neurogenic pain left leg.  Follow-Up Instructions: No follow-ups on file.   Orders:  No orders of the defined types were placed in this encounter.  Meds ordered this encounter  Medications   gabapentin (NEURONTIN) 100 MG capsule    Sig: Take 1 capsule (100 mg total) by mouth at bedtime.    Dispense:  30 capsule    Refill:  2   diclofenac (CATAFLAM) 50 MG tablet    Sig: Take 1 tablet (50 mg total) by mouth 3 (three) times daily.     Dispense:  90 tablet    Refill:  2   HYDROcodone-acetaminophen (NORCO) 7.5-325 MG tablet    Sig: Take 1 tablet by mouth every 6 (six) hours as needed for moderate pain.    Dispense:  30 tablet    Refill:  0      Procedures: No procedures performed   Clinical Data: No additional findings.   Subjective: Chief Complaint  Patient presents with   Lower Back - Pain    65 year old male with history of back pain and right sciatica for greater than 10 years. He has seen Dr. Ollen Bowl in the past on [address] for injections and also is taking meds for pain that are low potency hydrocodone, was obtaining from pain management off [facility]. And he has received from his primary care MD and then saw another downstairs and was prescribed a low dose medication and then arranged an appointment to be seen by myself. 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 He stopped working in July  2021. Left foot is weak and feels like a dropped foot. Has difficulty lifting 10-15 lbs more than a car length. Has difficulty standing on his heels or toes. Difficulty reaching his shoes and socks. Sitting  and reaching is better. No Bowel or bladder difficulty. He can not walk a mile and has to lean on the cart to grocery shop. Balance and walking issues last 6-7 months. He had to stop working due to  [employer] and back and he filed for disability and SSDI and that was complete. There is some feet numbness right greater than left sometimes with right heel pain and soreness. Into the feet bottoms sometimes with sharp pains. Hard to get comfortable with sleep and has to flex his hips, sleeps only 4-5 hours per night.    Review of Systems  Constitutional: Negative.  Negative for activity change, appetite change, chills, diaphoresis, fatigue, fever and unexpected weight change.  HENT:  Positive for dental problem. Negative for congestion, drooling, ear discharge, ear  pain, facial swelling, hearing loss, mouth sores, nosebleeds, postnasal drip, rhinorrhea, sinus pressure, sinus pain, sneezing, sore throat, tinnitus, trouble swallowing and voice change.   Eyes: Negative.  Negative for photophobia, pain, discharge, redness, itching and visual disturbance.  Respiratory:  Positive for shortness of breath (when tired, saw cardiology, Dr. Swaziland ok). Negative for apnea, cough, choking, chest tightness, wheezing and stridor.   Cardiovascular:  Positive for chest pain (some with exertion some hurting, incline to driveway with some SOB). Negative for palpitations and leg swelling.  Gastrointestinal:  Negative for abdominal distention, abdominal pain, anal bleeding, blood in stool, constipation, diarrhea, nausea, rectal pain and vomiting.  Endocrine: Negative.  Negative for cold intolerance, heat intolerance, polydipsia, polyphagia and polyuria.  Genitourinary:  Negative for difficulty urinating, dysuria, enuresis, flank pain, frequency, genital sores and hematuria.  Musculoskeletal:  Positive for back pain. Negative for arthralgias, gait problem, joint swelling, myalgias, neck pain and neck stiffness.  Skin: Negative.   Neurological:  Positive for headaches. Negative for dizziness, tremors, seizures, syncope, facial asymmetry, speech difficulty, weakness, light-headedness and numbness.  Hematological: Negative.  Negative for adenopathy. Does not bruise/bleed easily.  Psychiatric/Behavioral:  Positive for sleep disturbance. Negative for agitation, behavioral problems, confusion, decreased concentration, dysphoric mood, hallucinations, self-injury and suicidal ideas. The patient is not nervous/anxious and is not hyperactive.     Objective: Vital Signs: BP 131/76   Pulse 63   Ht 5\' 7"  (1.702 m)   Wt 185 lb (83.9 kg)   BMI 28.98 kg/m   Physical Exam  Back Exam   Tenderness  The patient is experiencing tenderness in the lumbar.  Range of Motion  Extension:   abnormal  Flexion:  70 abnormal  Lateral bend right:  abnormal  Lateral bend left:  abnormal  Rotation right:  abnormal  Rotation left:  abnormal   Muscle Strength  Right Quadriceps:  5/5  Left Quadriceps:  5/5  Right Hamstrings:  5/5  Left Hamstrings:  5/5   Reflexes  Patellar:  0/4 Achilles:  0/4  Other  Toe walk: abnormal Heel walk: abnormal Sensation: normal Erythema: no back redness Scars: absent  Comments:  Left foot DF weakness 4/5. Right EHL 4/5    Specialty Comments:  No specialty comments available.  Imaging: No results found.   PMFS History: Patient Active Problem List   Diagnosis Date Noted   Encounter for general adult medical examination with abnormal findings 10/31/2020   Screening for colon cancer 10/31/2020   Encounter to establish care 09/29/2020   Lumbar disc disease with radiculopathy 09/29/2020   Healthcare maintenance 09/29/2020   Screening for prostate cancer 09/29/2020   Carotid disease, bilateral (HCC) 10/06/2011   Thyroid nodule 10/06/2011   Coronary artery  disease    Diabetes mellitus type 2 in nonobese [facility])    Dyslipidemia    History of transient ischemic attack (TIA)    Past Medical History:  Diagnosis Date   Arthritis    Carotid disease, bilateral (HCC) June 2013   less than 50% stenosis in the right and left    Chronic kidney disease 2017   stones   Coronary artery disease    STATUS POST ANGIOPLASTY OF THE LAD    Diabetes mellitus    TYPE 2   Dyslipidemia    History of transient ischemic attack (TIA)    on aggrenox   Hyperlipidemia    Myocardial infarction [facility]) 1999   stent placement- per patient has 2 stents   Stroke [facility])    Thyroid nodule June 2013   had [place] with 4 mm small cystic nodule noted. Needs follow up US in 12 months. TSH is normal.     Family History  Problem Relation Age of Onset   Coronary artery disease Mother    Heart attack Mother    Diabetes Mother    Transient ischemic attack Mother     Heart disease Mother    Cancer Father    Colon cancer Neg Hx    Esophageal cancer Neg Hx    Colon polyps Neg Hx    Rectal cancer Neg Hx    Stomach cancer Neg Hx     Past Surgical History:  Procedure Laterality Date   COLONOSCOPY     13 years ago in [place] normal exam per pt   CORONARY ANGIOPLASTY WITH STENT PLACEMENT  03/28/2001   LAD. EJECTION FRACTION IS 45%   SHOULDER ARTHROSCOPY WITH OPEN ROTATOR CUFF REPAIR AND DISTAL CLAVICLE ACROMINECTOMY Right 09/05/2012   Procedure: RIGHT SHOULDER ARTHROSCOPY WITH LABRAL DEBRIDEMENT AND OPEN DISTAL CLAVICLE RESECTION, acromionectomy  AND OPEN ROTATOR CUFF REPAIR;  Surgeon: Drucilla Schmidt, MD;  Location: [facility];  Service: Orthopedics;  Laterality: Right;   Social History   Occupational History   Occupation: Repair semi trailers.  Tobacco Use   Smoking status: Former    Packs/day: 1.50    Years: 23.00    Pack years: 34.50    Types: Cigarettes    Quit date: 03/28/1997    Years since quitting: 23.8   Smokeless tobacco: Former    Types: Snuff, Chew   Tobacco comments:    as a teen  Vaping Use   Vaping Use: Never used  Substance and Sexual Activity   Alcohol use: Yes    Alcohol/week: 2.0 standard drinks    Types: 2 Cans of beer per week    Comment: socially   Drug use: No   Sexual activity: Not Currently

## 2021-01-14 NOTE — Patient Instructions (Signed)
Avoid bending, stooping and avoid lifting weights greater than 10 lbs. Avoid prolong standing and walking. Order for a new walker with wheels. Surgery scheduling secretary Tivis Ringer, will call you in the next week to schedule for surgery.  Surgery recommended is a L2-3,L3-4, L4-5 and L5-S1 level lumbar laminectomy this would be done with operating room microscope. Take hydrocodone for for pain. Risk of surgery includes risk of infection 1 in 100 patients, bleeding 1/2% chance you would need a transfusion.   Risk to the nerves is one in 10,000. Recommend that you have your diabetes treated and surgery will be done when your HbA1c is less than 8.0. Please see your cardiologist Dr. Swaziland for preoperative evaluation.  Expect relief of leg pain but numbness may persist depending on the length and degree of pressure that has been present. Gabapentin for nerve pain. Diclofenac 50 mg po up to TID for arthritis. Hydrocodone for chronic nerve pain and neurogenic pain left leg.

## 2021-01-19 ENCOUNTER — Ambulatory Visit: Payer: 59 | Admitting: Nurse Practitioner

## 2021-01-30 ENCOUNTER — Other Ambulatory Visit: Payer: Self-pay | Admitting: Nurse Practitioner

## 2021-02-01 ENCOUNTER — Encounter: Payer: Self-pay | Admitting: Specialist

## 2021-02-01 ENCOUNTER — Ambulatory Visit (INDEPENDENT_AMBULATORY_CARE_PROVIDER_SITE_OTHER): Payer: 59 | Admitting: Specialist

## 2021-02-01 ENCOUNTER — Other Ambulatory Visit: Payer: Self-pay

## 2021-02-01 VITALS — BP 122/82 | HR 73 | Ht 67.0 in | Wt 185.0 lb

## 2021-02-01 DIAGNOSIS — R29898 Other symptoms and signs involving the musculoskeletal system: Secondary | ICD-10-CM

## 2021-02-01 DIAGNOSIS — M5116 Intervertebral disc disorders with radiculopathy, lumbar region: Secondary | ICD-10-CM

## 2021-02-01 DIAGNOSIS — M545 Low back pain, unspecified: Secondary | ICD-10-CM

## 2021-02-01 DIAGNOSIS — M21372 Foot drop, left foot: Secondary | ICD-10-CM

## 2021-02-01 DIAGNOSIS — G8929 Other chronic pain: Secondary | ICD-10-CM

## 2021-02-01 DIAGNOSIS — E1142 Type 2 diabetes mellitus with diabetic polyneuropathy: Secondary | ICD-10-CM | POA: Diagnosis not present

## 2021-02-01 DIAGNOSIS — Z794 Long term (current) use of insulin: Secondary | ICD-10-CM

## 2021-02-01 DIAGNOSIS — M48062 Spinal stenosis, lumbar region with neurogenic claudication: Secondary | ICD-10-CM

## 2021-02-01 MED ORDER — HYDROCODONE-ACETAMINOPHEN 7.5-325 MG PO TABS: 1.0000 | ORAL_TABLET | Freq: Four times a day (QID) | ORAL | 0 refills | Status: DC | PRN

## 2021-02-01 NOTE — Progress Notes (Signed)
Office Visit Note   Patient: Mario Arnold           Date of Birth: 10-Feb-1956           MRN: [id number] Visit Date: 02/01/2021              Requested by: Carlean Jews, NP [address] [place],  [place] [address] PCP: Carlean Jews, NP   Assessment & Plan: Visit Diagnoses:  1. Type 2 diabetes mellitus with diabetic polyneuropathy, with long-term current use of insulin (HCC)   2. Bilateral leg weakness   3. Spinal stenosis of lumbar region with neurogenic claudication   4. Lumbar disc disease with radiculopathy   5. Chronic low back pain, unspecified back pain laterality, unspecified whether sciatica present   6. Left foot drop     Plan: Avoid bending, stooping and avoid lifting weights greater than 10 lbs. Avoid prolong standing and walking. Order for a new walker with wheels. Surgery scheduling secretary Tivis Ringer, will call you in the next week to schedule for surgery.  Surgery recommended is a L2-3,L3-4, L4-5 and L5-S1 level lumbar laminectomy this would be done with operating room microscope. Take hydrocodone for for pain. Risk of surgery includes risk of infection 1 in 100 patients, bleeding 1/2% chance you would need a transfusion.   Risk to the nerves is one in 10,000. Recommend that you have your diabetes treated and surgery will be done when your HbA1c is less than 8.0. Please see your cardiologist Dr. Swaziland for preoperative evaluation.  Expect relief of leg pain but numbness may persist depending on the length and degree of pressure that has been present. Gabapentin for nerve pain. Diclofenac 50 mg po up to TID for arthritis. Hydrocodone for chronic nerve pain and neurogenic pain left leg.  Follow-Up Instructions: No follow-ups on file.   Follow-Up Instructions: No follow-ups on file.   Orders:  No orders of the defined types were placed in this encounter.  No orders of the defined types were placed in this encounter.     Procedures: No  procedures performed   Clinical Data: No additional findings.   Subjective: Chief Complaint  Patient presents with   Lower Back - Follow-up    No changes    65 year old male with lumbar spinal stenosis and bilateral buttock and thigh and leg pain. No with weakness into both legs and  He is taking meds for pain gabapentin and also hydrocodone for pain. He has to settle in at night and is not comfortable with the back at night.  No bowel or bladder difficulty. Pain is worse with standing and walking and improves with bending and stooping and leaning. He has difficulty with stepping  Backwards and will loose his balance.   Review of Systems  Constitutional: Negative.  Negative for activity change, appetite change, chills, diaphoresis, fatigue, fever and unexpected weight change.  HENT: Negative.    Eyes: Negative.   Respiratory: Negative.    Cardiovascular: Negative.   Gastrointestinal: Negative.   Endocrine: Negative.   Genitourinary: Negative.   Musculoskeletal: Negative.   Skin: Negative.   Allergic/Immunologic: Negative.   Neurological: Negative.   Hematological: Negative.   Psychiatric/Behavioral: Negative.      Objective: Vital Signs: BP 122/82 (BP Location: Left Arm, Patient Position: Sitting)   Pulse 73   Ht 5\' 7"  (1.702 m)   Wt 185 lb (83.9 kg)   BMI 28.98 kg/m   Physical Exam Musculoskeletal:  Lumbar back: Negative right straight leg raise test and negative left straight leg raise test.   Back Exam   Tenderness  The patient is experiencing tenderness in the lumbar.  Range of Motion  Extension:  abnormal  Flexion:  normal  Lateral bend right:  abnormal  Lateral bend left:  abnormal  Rotation right:  abnormal  Rotation left:  abnormal   Muscle Strength  Right Quadriceps:  5/5  Left Quadriceps:  5/5  Right Hamstrings:  5/5  Left Hamstrings:  5/5   Tests  Straight leg raise right: negative Straight leg raise left: negative  Reflexes   Patellar:  0/4 Achilles:  0/4  Comments:  Left foot DF weakness 4/5.     Specialty Comments:  No specialty comments available.  Imaging: No results found.   PMFS History: Patient Active Problem List   Diagnosis Date Noted   Encounter for general adult medical examination with abnormal findings 10/31/2020   Screening for colon cancer 10/31/2020   Encounter to establish care 09/29/2020   Lumbar disc disease with radiculopathy 09/29/2020   Healthcare maintenance 09/29/2020   Screening for prostate cancer 09/29/2020   Carotid disease, bilateral (HCC) 10/06/2011   Thyroid nodule 10/06/2011   Coronary artery disease    Diabetes mellitus type 2 in nonobese [facility])    Dyslipidemia    History of transient ischemic attack (TIA)    Past Medical History:  Diagnosis Date   Arthritis    Carotid disease, bilateral (HCC) June 2013   less than 50% stenosis in the right and left    Chronic kidney disease 2017   stones   Coronary artery disease    STATUS POST ANGIOPLASTY OF THE LAD    Diabetes mellitus    TYPE 2   Dyslipidemia    History of transient ischemic attack (TIA)    on aggrenox   Hyperlipidemia    Myocardial infarction [facility]) 1999   stent placement- per patient has 2 stents   Stroke [facility])    Thyroid nodule June 2013   had [place] with 4 mm small cystic nodule noted. Needs follow up US in 12 months. TSH is normal.     Family History  Problem Relation Age of Onset   Coronary artery disease Mother    Heart attack Mother    Diabetes Mother    Transient ischemic attack Mother    Heart disease Mother    Cancer Father    Colon cancer Neg Hx    Esophageal cancer Neg Hx    Colon polyps Neg Hx    Rectal cancer Neg Hx    Stomach cancer Neg Hx     Past Surgical History:  Procedure Laterality Date   COLONOSCOPY     13 years ago in [place] normal exam per pt   CORONARY ANGIOPLASTY WITH STENT PLACEMENT  03/28/2001   LAD. EJECTION FRACTION IS 45%   SHOULDER ARTHROSCOPY  WITH OPEN ROTATOR CUFF REPAIR AND DISTAL CLAVICLE ACROMINECTOMY Right 09/05/2012   Procedure: RIGHT SHOULDER ARTHROSCOPY WITH LABRAL DEBRIDEMENT AND OPEN DISTAL CLAVICLE RESECTION, acromionectomy  AND OPEN ROTATOR CUFF REPAIR;  Surgeon: Drucilla Schmidt, MD;  Location: [facility];  Service: Orthopedics;  Laterality: Right;   Social History   Occupational History   Occupation: Repair semi trailers.  Tobacco Use   Smoking status: Former    Packs/day: 1.50    Years: 23.00    Pack years: 34.50    Types: Cigarettes    Quit date: 03/28/1997  Years since quitting: 23.8   Smokeless tobacco: Former    Types: Snuff, Chew   Tobacco comments:    as a teen  Vaping Use   Vaping Use: Never used  Substance and Sexual Activity   Alcohol use: Yes    Alcohol/week: 2.0 standard drinks    Types: 2 Cans of beer per week    Comment: socially   Drug use: No   Sexual activity: Not Currently

## 2021-02-01 NOTE — Patient Instructions (Signed)
Avoid bending, stooping and avoid lifting weights greater than 10 lbs. Avoid prolong standing and walking. Order for a new walker with wheels. Surgery scheduling secretary Tivis Ringer, will call you in the next week to schedule for surgery.  Surgery recommended is a L2-3,L3-4, L4-5 and L5-S1 level lumbar laminectomy this would be done with operating room microscope. Take hydrocodone for for pain. Risk of surgery includes risk of infection 1 in 100 patients, bleeding 1/2% chance you would need a transfusion.   Risk to the nerves is one in 10,000. Recommend that you have your diabetes treated and surgery will be done when your HbA1c is less than 8.0. Please see your cardiologist Dr. Swaziland for preoperative evaluation.  Expect relief of leg pain but numbness may persist depending on the length and degree of pressure that has been present. Gabapentin for nerve pain. Diclofenac 50 mg po up to TID for arthritis. Hydrocodone for chronic nerve pain and neurogenic pain left leg.  Still waiting to hear from Dr. Elvis Coil office as to cardiac clearance.

## 2021-02-11 ENCOUNTER — Encounter: Payer: Self-pay | Admitting: Nurse Practitioner

## 2021-02-11 ENCOUNTER — Ambulatory Visit (INDEPENDENT_AMBULATORY_CARE_PROVIDER_SITE_OTHER): Payer: 59 | Admitting: Nurse Practitioner

## 2021-02-11 ENCOUNTER — Other Ambulatory Visit: Payer: Self-pay

## 2021-02-11 VITALS — BP 108/66 | HR 56 | Temp 97.6°F | Ht 67.0 in | Wt 183.3 lb

## 2021-02-11 DIAGNOSIS — E785 Hyperlipidemia, unspecified: Secondary | ICD-10-CM | POA: Diagnosis not present

## 2021-02-11 DIAGNOSIS — E119 Type 2 diabetes mellitus without complications: Secondary | ICD-10-CM

## 2021-02-11 DIAGNOSIS — I25119 Atherosclerotic heart disease of native coronary artery with unspecified angina pectoris: Secondary | ICD-10-CM | POA: Diagnosis not present

## 2021-02-11 DIAGNOSIS — Z6828 Body mass index (BMI) 28.0-28.9, adult: Secondary | ICD-10-CM

## 2021-02-11 LAB — POCT GLYCOSYLATED HEMOGLOBIN (HGB A1C): Hemoglobin A1C: 9 % — AB (ref 4.0–5.6)

## 2021-02-11 MED ORDER — TRULICITY 0.75 MG/0.5ML ~~LOC~~ SOAJ: 0.7500 mg | SUBCUTANEOUS | 2 refills | Status: DC

## 2021-02-11 NOTE — Progress Notes (Signed)
Established Patient Office Visit  Subjective:  Patient ID: Mario Arnold, male    DOB: Nov 04, 1955  Age: 65 y.o. MRN: [id number]  CC:  Chief Complaint  Patient presents with   Diabetes     HPI [name] presents for follow up visit. Blood sugars are improved though still moderately elevated. His HgbA1c is 9.0 today, down from 11.5 at initial check. He states that he has made some good dietary changes. Has limited his intake of pasta, bread, and other carbohydrates. He has increased intake of low fat meats and vegetables. He states that he needs to have decompression surgery on his spine. Needs to have HgbA1c around 8 in order to have surgery. Will try to add trulicity or ozempic. Recheck hgbA1c in 3 months. The patient has no other concerns or complaints. He denies chest pain, chest pressure, or shortness of breath. He denies headaches or visual disturbances. He denies abdominal pain, nausea, vomiting, or changes in bowel or bladder habits.    Past Medical History:  Diagnosis Date   Arthritis    Carotid disease, bilateral ([place]) June 2013   less than 50% stenosis in the right and left    Chronic kidney disease 2017   stones   Coronary artery disease    STATUS POST ANGIOPLASTY OF THE LAD    Diabetes mellitus    TYPE 2   Dyslipidemia    History of transient ischemic attack (TIA)    on aggrenox   Hyperlipidemia    Myocardial infarction [facility]) 1999   stent placement- per patient has 2 stents   Stroke [facility])    Thyroid nodule June 2013   had [place] with 4 mm small cystic nodule noted. Needs follow up US in 12 months. TSH is normal.     Past Surgical History:  Procedure Laterality Date   COLONOSCOPY     13 years ago in [facility] normal exam per pt   CORONARY ANGIOPLASTY WITH STENT PLACEMENT  03/28/2001   LAD. EJECTION FRACTION IS 45%   SHOULDER ARTHROSCOPY WITH OPEN ROTATOR CUFF REPAIR AND DISTAL CLAVICLE ACROMINECTOMY Right 09/05/2012   Procedure: RIGHT SHOULDER ARTHROSCOPY  WITH LABRAL DEBRIDEMENT AND OPEN DISTAL CLAVICLE RESECTION, acromionectomy  AND OPEN ROTATOR CUFF REPAIR;  Surgeon: Magnus Sinning, MD;  Location: [facility];  Service: Orthopedics;  Laterality: Right;    Family History  Problem Relation Age of Onset   Coronary artery disease Mother    Heart attack Mother    Diabetes Mother    Transient ischemic attack Mother    Heart disease Mother    Cancer Father    Colon cancer Neg Hx    Esophageal cancer Neg Hx    Colon polyps Neg Hx    Rectal cancer Neg Hx    Stomach cancer Neg Hx     Social History   Socioeconomic History   Marital status: Married    Spouse name: Viaan Knippenberg   Number of children: 2   Years of education: Not on file   Highest education level: Not on file  Occupational History   Occupation: Repair semi trailers.  Tobacco Use   Smoking status: Former    Packs/day: 1.50    Years: 23.00    Pack years: 34.50    Types: Cigarettes    Quit date: 03/28/1997    Years since quitting: 23.9   Smokeless tobacco: Former    Types: Snuff, Chew   Tobacco comments:    as a teen  Vaping  Use   Vaping Use: Never used  Substance and Sexual Activity   Alcohol use: Yes    Alcohol/week: 2.0 standard drinks    Types: 2 Cans of beer per week    Comment: socially   Drug use: No   Sexual activity: Not Currently  Other Topics Concern   Not on file  Social History Narrative   Not on file   Social Determinants of Health   Financial Resource Strain: Not on file  Food Insecurity: Not on file  Transportation Needs: Not on file  Physical Activity: Not on file  Stress: Not on file  Social Connections: Not on file  Intimate Partner Violence: Not on file    Outpatient Medications Prior to Visit  Medication Sig Dispense Refill   acetaminophen (TYLENOL) 500 MG tablet Take 1,000 mg by mouth as needed for mild pain.      aspirin EC 81 MG tablet Take 1 tablet (81 mg total) by mouth daily. Swallow whole. 90 tablet 3   atorvastatin  (LIPITOR) 80 MG tablet Take 1 tablet (80 mg total) by mouth daily. 90 tablet 1   blood glucose meter kit and supplies Dispense based on patient and insurance preference. Use up to four times daily as directed. (FOR ICD-10 E10.9, E11.9). 1 each 0   diclofenac (CATAFLAM) 50 MG tablet Take 1 tablet (50 mg total) by mouth 3 (three) times daily. 90 tablet 2   ezetimibe (ZETIA) 10 MG tablet Take 1 tablet (10 mg total) by mouth every evening. 90 tablet 1   fenofibrate 160 MG tablet Take 1 tablet (160 mg total) by mouth daily. 90 tablet 1   gabapentin (NEURONTIN) 100 MG capsule Take 1 capsule (100 mg total) by mouth at bedtime. 30 capsule 2   glyBURIDE (DIABETA) 5 MG tablet Take 2 tablets (10 mg total) by mouth 2 (two) times daily with a meal. 180 tablet 1   HYDROcodone-acetaminophen (NORCO) 7.5-325 MG tablet Take 1 tablet by mouth every 6 (six) hours as needed for moderate pain. 30 tablet 0   HYDROcodone-acetaminophen (NORCO/VICODIN) 5-325 MG tablet Take 1 tablet by mouth every 4 (four) hours as needed for moderate pain. 30 tablet 0   isosorbide mononitrate (IMDUR) 60 MG 24 hr tablet Take 1 tablet by mouth once daily 90 tablet 0   losartan (COZAAR) 25 MG tablet Take 1 tablet (25 mg total) by mouth daily. 90 tablet 1   metFORMIN (GLUCOPHAGE) 500 MG tablet Take 1 tablet (500 mg total) by mouth 4 (four) times daily. Two in am and 2 in pm 360 tablet 1   metoprolol tartrate (LOPRESSOR) 25 MG tablet Take 1 tablet (25 mg total) by mouth 2 (two) times daily. 180 tablet 1   nitroGLYCERIN (NITROSTAT) 0.4 MG SL tablet Place 1 tablet (0.4 mg total) under the tongue every 5 (five) minutes as needed. 25 tablet 6   omega-3 acid ethyl esters (LOVAZA) 1 g capsule Take 2 capsules (2 g total) by mouth 2 (two) times daily. 120 capsule 3   pioglitazone (ACTOS) 30 MG tablet Take 1 tablet (30 mg total) by mouth daily. 30 tablet 2   No facility-administered medications prior to visit.    Allergies  Allergen Reactions    Hydromorphone Nausea And Vomiting    ROS Review of Systems  Constitutional:  Negative for activity change, chills, fatigue and fever.  HENT:  Negative for congestion, postnasal drip, rhinorrhea, sinus pressure, sinus pain, sneezing and sore throat.   Eyes: Negative.   Respiratory:  Negative for cough, shortness of breath and wheezing.   Cardiovascular:  Negative for chest pain and palpitations.  Gastrointestinal:  Negative for constipation, diarrhea, nausea and vomiting.  Endocrine: Negative for cold intolerance, heat intolerance, polydipsia and polyuria.       Blood sugars improving but nnot optimal.   Genitourinary:  Negative for dysuria, frequency and urgency.  Musculoskeletal:  Negative for back pain and myalgias.  Skin:  Negative for rash.  Allergic/Immunologic: Negative for environmental allergies.  Neurological:  Negative for dizziness, weakness and headaches.  Psychiatric/Behavioral:  The patient is not nervous/anxious.      Objective:    Physical Exam Vitals and nursing note reviewed.  Constitutional:      Appearance: Normal appearance. He is well-developed.  HENT:     Head: Normocephalic and atraumatic.     Nose: Nose normal.     Mouth/Throat:     Mouth: Mucous membranes are moist.  Eyes:     Extraocular Movements: Extraocular movements intact.     Conjunctiva/sclera: Conjunctivae normal.     Pupils: Pupils are equal, round, and reactive to light.  Cardiovascular:     Rate and Rhythm: Normal rate and regular rhythm.     Pulses: Normal pulses.     Heart sounds: Normal heart sounds.  Pulmonary:     Effort: Pulmonary effort is normal.     Breath sounds: Normal breath sounds.  Abdominal:     Palpations: Abdomen is soft.  Musculoskeletal:        General: Normal range of motion.     Cervical back: Normal range of motion and neck supple.  Lymphadenopathy:     Cervical: No cervical adenopathy.  Skin:    General: Skin is warm and dry.     Capillary Refill:  Capillary refill takes less than 2 seconds.  Neurological:     General: No focal deficit present.     Mental Status: He is alert and oriented to person, place, and time.  Psychiatric:        Mood and Affect: Mood normal.        Behavior: Behavior normal.        Thought Content: Thought content normal.        Judgment: Judgment normal.    Today's Vitals   02/11/21 1026  BP: 108/66  Pulse: (!) 56  Temp: 97.6 F (36.4 C)  SpO2: 99%  Weight: 183 lb 4.8 oz (83.1 kg)  Height: _0  (1.702 m)   Body mass index is 28.71 kg/m.   Wt Readings from Last 3 Encounters:  02/11/21 183 lb 4.8 oz (83.1 kg)  02/01/21 185 lb (83.9 kg)  01/14/21 185 lb (83.9 kg)     Health Maintenance Due  Topic Date Due   COVID-19 Vaccine (1) Never done   FOOT EXAM  Never done   OPHTHALMOLOGY EXAM  Never done   HIV Screening  Never done   Hepatitis C Screening  Never done   Pneumococcal Vaccine 40-41 Years old (2 - PCV) 01/25/2020   INFLUENZA VACCINE  10/26/2020    There are no preventive care reminders to display for this patient.  Lab Results  Component Value Date   TSH 2.520 09/29/2020   Lab Results  Component Value Date   WBC 4.8 09/29/2020   HGB 16.3 09/29/2020   HCT 48.9 09/29/2020   MCV 85 09/29/2020   PLT 177 09/29/2020   Lab Results  Component Value Date   NA 133 (L) 09/29/2020   K  4.4 09/29/2020   CO2 20 09/29/2020   GLUCOSE 273 (H) 09/29/2020   BUN 16 09/29/2020   CREATININE 1.01 09/29/2020   BILITOT 0.3 09/29/2020   ALKPHOS 113 09/29/2020   AST 35 09/29/2020   ALT 40 09/29/2020   PROT 6.9 09/29/2020   ALBUMIN 4.2 09/29/2020   CALCIUM 9.2 09/29/2020   ANIONGAP 10 12/04/2019   EGFR 84 09/29/2020   GFR 59.65 (L) 05/07/2013   Lab Results  Component Value Date   CHOL 282 (H) 09/29/2020   Lab Results  Component Value Date   HDL 28 (L) 09/29/2020   Lab Results  Component Value Date   LDLCALC 118 (H) 09/29/2020   Lab Results  Component Value Date   TRIG 743  (HH) 09/29/2020   Lab Results  Component Value Date   CHOLHDL 10.1 (H) 09/29/2020   Lab Results  Component Value Date   HGBA1C 9.0 (A) 02/11/2021      Assessment & Plan:  1. Diabetes mellitus type 2 in nonobese (HCC) Hemoglobin A1c 9.0 down from 11.5 at last visit.  We will add Trulicity 8.78 mg weekly.  Patient should continue to follow a low carbohydrate, low sugar diet.  Check blood sugars daily.  Goal is to have fasting blood sugars between 70 and 120.  We will recheck hemoglobin A1c at next visit in 3 months. - POCT glycosylated hemoglobin (Hb A1C) - Dulaglutide (TRULICITY) 6.76 HM/0.9OB SOPN; Inject 0.75 mg into the skin once a week.  Dispense: 2 mL; Refill: 2  2. Dyslipidemia Continue cholesterol-lowering medications as prescribed.  3. Coronary artery disease involving native coronary artery of native heart with angina pectoris ([place]) Continue blood pressure and cholesterol medications as prescribed.  4. Body mass index 28.0-28.9, adult Encourage patient to limit calorie intake to 2000 cal/day or less.  He should consume a low cholesterol, low-fat diet.  Patient should incorporate exercise into his daily routine.    Problem List Items Addressed This Visit       Cardiovascular and Mediastinum   Coronary artery disease     Endocrine   Diabetes mellitus type 2 in nonobese ([place]) - Primary   Relevant Medications   Dulaglutide (TRULICITY) 0.96 GE/3.6OQ SOPN   Other Relevant Orders   POCT glycosylated hemoglobin (Hb A1C) (Completed)     Other   Dyslipidemia   Body mass index 28.0-28.9, adult    Meds ordered this encounter  Medications   Dulaglutide (TRULICITY) 9.47 ML/4.6TK SOPN    Sig: Inject 0.75 mg into the skin once a week.    Dispense:  2 mL    Refill:  2    Order Specific Question:   Supervising Provider    Answer:   Beatrice Lecher D [[id number]]     Follow-up: Return in about 3 months (around 05/14/2021) for diabetes with HgbA1c check.    Ronnell Freshwater, NP  This note was dictated using Systems analyst. Rapid proofreading was performed to expedite the delivery of the information. Despite proofreading, phonetic errors will occur which are common with this voice recognition software. Please take this into consideration. If there are any concerns, please contact our office.

## 2021-02-17 ENCOUNTER — Telehealth: Payer: Self-pay

## 2021-02-17 NOTE — Telephone Encounter (Signed)
-----   Message from [facility] [place], [place] sent at 02/17/2021 12:51 PM EST ----- He needs to get the A1c down below 8.0% ----- Message ----- From: Aldean Jewett Sent: 02/15/2021   2:09 PM EST To: Kerrin Champagne, MD, [facility], [place]  See A1c from 02-11-21.  Surgery order written 02-10-21.  Please advise.  Thanks!

## 2021-02-17 NOTE — Telephone Encounter (Signed)
I called patient and he is aware of need for lower A1c before surgery.

## 2021-02-21 DIAGNOSIS — Z6828 Body mass index (BMI) 28.0-28.9, adult: Secondary | ICD-10-CM | POA: Insufficient documentation

## 2021-02-21 NOTE — Patient Instructions (Signed)
Fat and Cholesterol Restricted Eating Plan Getting too much fat and cholesterol in your diet may cause health problems. Choosing the right foods helps keep your fat and cholesterol at normal levels. This can keep you from getting certain diseases. Your doctor may recommend an eating plan that includes: Total fat: ______% or less of total calories a day. This is ______g of fat a day. Saturated fat: ______% or less of total calories a day. This is ______g of saturated fat a day. Cholesterol: less than _________mg a day. Fiber: ______g a day. What are tips for following this plan? General tips Work with your doctor to lose weight if you need to. Avoid: Foods with added sugar. Fried foods. Foods with trans fat or partially hydrogenated oils. This includes some margarines and baked goods. If you drink alcohol: Limit how much you have to: 0-1 drink a day for women who are not pregnant. 0-2 drinks a day for men. Know how much alcohol is in a drink. In the [place] one drink equals one 12 oz bottle of beer (355 mL), one 5 oz glass of wine (148 mL), or one 1 oz glass of hard liquor (44 mL). Reading food labels Check food labels for: Trans fats. Partially hydrogenated oils. Saturated fat (g) in each serving. Cholesterol (mg) in each serving. Fiber (g) in each serving. Choose foods with healthy fats, such as: Monounsaturated fats and polyunsaturated fats. These include olive and canola oil, flaxseeds, walnuts, almonds, and seeds. Omega-3 fats. These are found in certain fish, flaxseed oil, and ground flaxseeds. Choose grain products that have whole grains. Look for the word "whole" as the first word in the ingredient list. Cooking Cook foods using low-fat methods. These include baking, boiling, grilling, and broiling. Eat more home-cooked foods. Eat at restaurants and buffets less often. Eat less fast food. Avoid cooking using saturated fats, such as butter, cream, palm oil, palm kernel oil, and  coconut oil. Meal planning  At meals, divide your plate into four equal parts: Fill one-half of your plate with vegetables, green salads, and fruit. Fill one-fourth of your plate with whole grains. Fill one-fourth of your plate with low-fat (lean) protein foods. Eat fish that is high in omega-3 fats at least two times a week. This includes mackerel, tuna, sardines, and salmon. Eat foods that are high in fiber, such as whole grains, beans, apples, pears, berries, broccoli, carrots, peas, and barley. What foods should I eat? Fruits All fresh, canned (in natural juice), or frozen fruits. Vegetables Fresh or frozen vegetables (raw, steamed, roasted, or grilled). Green salads. Grains Whole grains, such as whole wheat or whole grain breads, crackers, cereals, and pasta. Unsweetened oatmeal, bulgur, barley, quinoa, or brown rice. Corn or whole wheat flour tortillas. Meats and other protein foods Ground beef (85% or leaner), grass-fed beef, or beef trimmed of fat. Skinless chicken or turkey. Ground chicken or turkey. Pork trimmed of fat. All fish and seafood. Egg whites. Dried beans, peas, or lentils. Unsalted nuts or seeds. Unsalted canned beans. Nut butters without added sugar or oil. Dairy Low-fat or nonfat dairy products, such as skim or 1% milk, 2% or reduced-fat cheeses, low-fat and fat-free ricotta or cottage cheese, or plain low-fat and nonfat yogurt. Fats and oils Tub margarine without trans fats. Light or reduced-fat mayonnaise and salad dressings. Avocado. Olive, canola, sesame, or safflower oils. The items listed above may not be a complete list of foods and beverages you can eat. Contact a dietitian for more information. What foods   should I avoid? Fruits Canned fruit in heavy syrup. Fruit in cream or butter sauce. Fried fruit. Vegetables Vegetables cooked in cheese, cream, or butter sauce. Fried vegetables. Grains White bread. White pasta. White rice. Cornbread. Bagels, pastries,  and croissants. Crackers and snack foods that contain trans fat and hydrogenated oils. Meats and other protein foods Fatty cuts of meat. Ribs, chicken wings, bacon, sausage, bologna, salami, chitterlings, fatback, hot dogs, bratwurst, and packaged lunch meats. Liver and organ meats. Whole eggs and egg yolks. Chicken and turkey with skin. Fried meat. Dairy Whole or 2% milk, cream, half-and-half, and cream cheese. Whole milk cheeses. Whole-fat or sweetened yogurt. Full-fat cheeses. Nondairy creamers and whipped toppings. Processed cheese, cheese spreads, and cheese curds. Fats and oils Butter, stick margarine, lard, shortening, ghee, or bacon fat. Coconut, palm kernel, and palm oils. Beverages Alcohol. Sugar-sweetened drinks such as sodas, lemonade, and fruit drinks. Sweets and desserts Corn syrup, sugars, honey, and molasses. Candy. Jam and jelly. Syrup. Sweetened cereals. Cookies, pies, cakes, donuts, muffins, and ice cream. The items listed above may not be a complete list of foods and beverages you should avoid. Contact a dietitian for more information. Summary Choosing the right foods helps keep your fat and cholesterol at normal levels. This can keep you from getting certain diseases. At meals, fill one-half of your plate with vegetables, green salads, and fruits. Eat high fiber foods, like whole grains, beans, apples, pears, berries, carrots, peas, and barley. Limit added sugar, saturated fats, alcohol, and fried foods. This information is not intended to replace advice given to you by your health care provider. Make sure you discuss any questions you have with your health care provider. Document Revised: 07/24/2020 Document Reviewed: 07/24/2020 Elsevier Patient Education  2022 [employer] Inc.  

## 2021-03-03 ENCOUNTER — Encounter: Payer: Self-pay | Admitting: Specialist

## 2021-03-03 ENCOUNTER — Other Ambulatory Visit: Payer: Self-pay

## 2021-03-03 ENCOUNTER — Ambulatory Visit (INDEPENDENT_AMBULATORY_CARE_PROVIDER_SITE_OTHER): Payer: 59 | Admitting: Specialist

## 2021-03-03 VITALS — BP 154/79 | HR 69 | Ht 67.0 in | Wt 183.4 lb

## 2021-03-03 DIAGNOSIS — M21372 Foot drop, left foot: Secondary | ICD-10-CM

## 2021-03-03 DIAGNOSIS — M79672 Pain in left foot: Secondary | ICD-10-CM | POA: Diagnosis not present

## 2021-03-03 DIAGNOSIS — Z794 Long term (current) use of insulin: Secondary | ICD-10-CM

## 2021-03-03 DIAGNOSIS — M5116 Intervertebral disc disorders with radiculopathy, lumbar region: Secondary | ICD-10-CM | POA: Diagnosis not present

## 2021-03-03 DIAGNOSIS — M48062 Spinal stenosis, lumbar region with neurogenic claudication: Secondary | ICD-10-CM

## 2021-03-03 DIAGNOSIS — R29898 Other symptoms and signs involving the musculoskeletal system: Secondary | ICD-10-CM | POA: Diagnosis not present

## 2021-03-03 DIAGNOSIS — E1142 Type 2 diabetes mellitus with diabetic polyneuropathy: Secondary | ICD-10-CM

## 2021-03-03 NOTE — Patient Instructions (Signed)
  Plan: Avoid bending, stooping and avoid lifting weights greater than 10 lbs. Avoid prolong standing and walking. Order for a new walker with wheels. Surgery scheduling secretary Tivis Ringer, will call you in the next week to schedule for surgery.  Surgery recommended is a L2-3,L3-4, L4-5 and L5-S1 level lumbar laminectomy this would be done with operating room microscope. Take hydrocodone for for pain. Risk of surgery includes risk of infection 1 in 100 patients, bleeding 1/2% chance you would need a transfusion.   Risk to the nerves is one in 10,000. Recommend that you have your diabetes treated and surgery will be done when your HbA1c is less than 8.0. Please see your cardiologist Dr. [place] for preoperative evaluation.  Expect relief of leg pain but numbness may persist depending on the length and degree of pressure that has been present. Gabapentin for nerve pain. Diclofenac 50 mg po up to TID for arthritis. Hydrocodone for chronic nerve pain and neurogenic pain left leg.  Follow-Up Instructions: No follow-ups on file.

## 2021-03-03 NOTE — Progress Notes (Addendum)
Office Visit Note   Patient: Mario Arnold           Date of Birth: 17-Apr-1955           MRN: [id number] Visit Date: 03/03/2021              Requested by: Carlean Jews, NP [address] [place],  [place] [address] PCP: Carlean Jews, NP   Assessment & Plan: Visit Diagnoses:  1. Spinal stenosis of lumbar region with neurogenic claudication   2. Lumbar disc disease with radiculopathy   3. Left foot drop   4. Bilateral leg weakness   5. Type 2 diabetes mellitus with diabetic polyneuropathy, with long-term current use of insulin (HCC)      Plan: Avoid bending, stooping and avoid lifting weights greater than 10 lbs. Avoid prolong standing and walking. Order for a new walker with wheels. Surgery scheduling secretary Tivis Ringer, will call you in the next week to schedule for surgery.  Surgery recommended is a L2-3,L3-4, L4-5 and L5-S1 level lumbar laminectomy this would be done with operating room microscope. Take hydrocodone for for pain. Risk of surgery includes risk of infection 1 in 100 patients, bleeding 1/2% chance you would need a transfusion.   Risk to the nerves is one in 10,000. Recommend that you have your diabetes treated and surgery will be done when your HbA1c is less than 8.0. Please see your cardiologist Dr. Swaziland for preoperative evaluation.  Expect relief of leg pain but numbness may persist depending on the length and degree of pressure that has been present. Gabapentin for nerve pain. Diclofenac 50 mg po up to TID for arthritis. Hydrocodone for chronic nerve pain and neurogenic pain left leg.  Follow-Up Instructions: No follow-ups on file.     Follow-Up Instructions: Return in about 4 weeks (around 03/31/2021).   Orders:  No orders of the defined types were placed in this encounter.  No orders of the defined types were placed in this encounter.     Procedures: No procedures performed   Clinical Data: No additional  findings.   Subjective: Chief Complaint  Patient presents with   Lower Back - Follow-up    65 year old male with history of multiple level lumbar spinal stenosis he is to be scheduled for lumbar laminectomy L2-3, L3-4, L4-5 and L5-S1 for multiple level lumbar spinal stenosis but his blood sugars due to diabetes remain elevated with a HgbA1c of 9,02/11/2021.    Review of Systems  Constitutional: Negative.   HENT: Negative.    Eyes: Negative.   Respiratory: Negative.    Cardiovascular: Negative.   Gastrointestinal: Negative.   Endocrine: Negative.   Genitourinary: Negative.   Musculoskeletal: Negative.   Skin: Negative.   Allergic/Immunologic: Negative.   Neurological: Negative.   Hematological: Negative.   Psychiatric/Behavioral: Negative.      Objective: Vital Signs: BP (!) 154/79 (BP Location: Left Arm, Patient Position: Sitting)   Pulse 69   Ht 5\' 7"  (1.702 m)   Wt 183 lb 6.4 oz (83.2 kg)   BMI 28.72 kg/m   Physical Exam Musculoskeletal:     Lumbar back: Negative right straight leg raise test and negative left straight leg raise test.   Back Exam   Tenderness  The patient is experiencing tenderness in the lumbar.  Range of Motion  Extension:  abnormal  Flexion:  abnormal  Lateral bend right:  abnormal  Lateral bend left:  abnormal  Rotation right:  abnormal  Rotation left:  abnormal   Muscle Strength  Right Quadriceps:  5/5  Left Quadriceps:  4/5  Right Hamstrings:  5/5  Left Hamstrings:  5/5   Tests  Straight leg raise right: negative Straight leg raise left: negative  Reflexes  Patellar:  0/4 Achilles:  0/4  Other  Toe walk: abnormal Heel walk: abnormal Gait: drop-foot  Erythema: no back redness Scars: absent    Specialty Comments:  No specialty comments available.  Imaging: No results found.   PMFS History: Patient Active Problem List   Diagnosis Date Noted   Pain in joint of right hand 05/11/2021   Essential hypertension  05/11/2021   Body mass index 28.0-28.9, adult 02/21/2021   Encounter for general adult medical examination with abnormal findings 10/31/2020   Screening for colon cancer 10/31/2020   Encounter to establish care 09/29/2020   Lumbar disc disease with radiculopathy 09/29/2020   Healthcare maintenance 09/29/2020   Screening for prostate cancer 09/29/2020   Carotid disease, bilateral (HCC) 10/06/2011   Thyroid nodule 10/06/2011   Coronary artery disease    Diabetes mellitus type 2 in nonobese [facility])    Dyslipidemia    History of transient ischemic attack (TIA)    Past Medical History:  Diagnosis Date   Arthritis    Carotid disease, bilateral (HCC) June 2013   less than 50% stenosis in the right and left    Chronic kidney disease 2017   stones   Coronary artery disease    STATUS POST ANGIOPLASTY OF THE LAD    Diabetes mellitus    TYPE 2   Dyslipidemia    History of transient ischemic attack (TIA)    on aggrenox   Hyperlipidemia    Myocardial infarction [facility]) 1999   stent placement- per patient has 2 stents   Stroke [place])    Thyroid nodule June 2013   had [place] with 4 mm small cystic nodule noted. Needs follow up US in 12 months. TSH is normal.     Family History  Problem Relation Age of Onset   Coronary artery disease Mother    Heart attack Mother    Diabetes Mother    Transient ischemic attack Mother    Heart disease Mother    Cancer Father    Colon cancer Neg Hx    Esophageal cancer Neg Hx    Colon polyps Neg Hx    Rectal cancer Neg Hx    Stomach cancer Neg Hx     Past Surgical History:  Procedure Laterality Date   COLONOSCOPY     13 years ago in [place] normal exam per pt   CORONARY ANGIOPLASTY WITH STENT PLACEMENT  03/28/2001   LAD. EJECTION FRACTION IS 45%   SHOULDER ARTHROSCOPY WITH OPEN ROTATOR CUFF REPAIR AND DISTAL CLAVICLE ACROMINECTOMY Right 09/05/2012   Procedure: RIGHT SHOULDER ARTHROSCOPY WITH LABRAL DEBRIDEMENT AND OPEN DISTAL CLAVICLE RESECTION,  acromionectomy  AND OPEN ROTATOR CUFF REPAIR;  Surgeon: Drucilla Schmidt, MD;  Location: [facility];  Service: Orthopedics;  Laterality: Right;   Social History   Occupational History   Occupation: Repair semi trailers.  Tobacco Use   Smoking status: Former    Packs/day: 1.50    Years: 23.00    Pack years: 34.50    Types: Cigarettes    Quit date: 03/28/1997    Years since quitting: 24.2   Smokeless tobacco: Former    Types: Snuff, Chew   Tobacco comments:    as a teen  Vaping Use  Vaping Use: Never used  Substance and Sexual Activity   Alcohol use: Yes    Alcohol/week: 2.0 standard drinks    Types: 2 Cans of beer per week    Comment: socially   Drug use: No   Sexual activity: Not Currently

## 2021-03-26 ENCOUNTER — Other Ambulatory Visit: Payer: Self-pay | Admitting: Physician Assistant

## 2021-03-26 ENCOUNTER — Telehealth: Payer: Self-pay | Admitting: Specialist

## 2021-03-26 MED ORDER — HYDROCODONE-ACETAMINOPHEN 5-325 MG PO TABS: 1.0000 | ORAL_TABLET | ORAL | 0 refills | Status: DC | PRN

## 2021-03-26 NOTE — Telephone Encounter (Signed)
Pt calling to get a refill on his medication of hydrocodone. The best pharmacy is [id number] - [place] ([place]), [place] - [address] and the best call back number is [phone].

## 2021-03-26 NOTE — Telephone Encounter (Signed)
Can you please advise since Dr Nitka is out of the office? 

## 2021-03-26 NOTE — Telephone Encounter (Signed)
refilled 

## 2021-03-26 NOTE — Telephone Encounter (Signed)
I called patient and advised. 

## 2021-03-31 ENCOUNTER — Ambulatory Visit: Payer: Self-pay | Admitting: Specialist

## 2021-05-02 ENCOUNTER — Other Ambulatory Visit: Payer: Self-pay | Admitting: Nurse Practitioner

## 2021-05-02 DIAGNOSIS — I25119 Atherosclerotic heart disease of native coronary artery with unspecified angina pectoris: Secondary | ICD-10-CM

## 2021-05-02 DIAGNOSIS — E119 Type 2 diabetes mellitus without complications: Secondary | ICD-10-CM

## 2021-05-11 ENCOUNTER — Ambulatory Visit (INDEPENDENT_AMBULATORY_CARE_PROVIDER_SITE_OTHER): Payer: 59 | Admitting: Nurse Practitioner

## 2021-05-11 ENCOUNTER — Encounter: Payer: Self-pay | Admitting: Nurse Practitioner

## 2021-05-11 ENCOUNTER — Other Ambulatory Visit: Payer: Self-pay

## 2021-05-11 VITALS — BP 101/57 | HR 65 | Temp 97.5°F | Ht 67.0 in | Wt 181.1 lb

## 2021-05-11 DIAGNOSIS — I10 Essential (primary) hypertension: Secondary | ICD-10-CM

## 2021-05-11 DIAGNOSIS — I152 Hypertension secondary to endocrine disorders: Secondary | ICD-10-CM | POA: Insufficient documentation

## 2021-05-11 DIAGNOSIS — E785 Hyperlipidemia, unspecified: Secondary | ICD-10-CM

## 2021-05-11 DIAGNOSIS — M25541 Pain in joints of right hand: Secondary | ICD-10-CM | POA: Insufficient documentation

## 2021-05-11 DIAGNOSIS — E119 Type 2 diabetes mellitus without complications: Secondary | ICD-10-CM | POA: Diagnosis not present

## 2021-05-11 LAB — POCT GLYCOSYLATED HEMOGLOBIN (HGB A1C): Hemoglobin A1C: 6.2 % — AB (ref 4.0–5.6)

## 2021-05-11 MED ORDER — TRULICITY 1.5 MG/0.5ML ~~LOC~~ SOAJ
1.5000 mg | SUBCUTANEOUS | 3 refills | Status: DC
Start: 2021-05-11 — End: 2021-06-02

## 2021-05-11 NOTE — Progress Notes (Signed)
Established patient visit   Patient: Mario Arnold   DOB: Jan 01, 1956   66 y.o. Male  MRN: [id number] Visit Date: 05/11/2021   Chief Complaint  Patient presents with   Follow-up   Diabetes   Subjective    Diabetes Pertinent negatives for hypoglycemia include no dizziness, headaches or nervousness/anxiousness. Pertinent negatives for diabetes include no chest pain, no fatigue, no polydipsia, no polyuria and no weakness.   The patient is here for follow up of diabetes. Was started on Trulicity 0.$RemoveBefore'75mg'wxpNqULcCQgqZ$  weekly. He continues to take metformin, glyburide, metformin, and actos. His HgbA1c is 6.2 which was 9.0 at his last visit. He is tolerating the trulicity very well. Has lost two pounds. We would both like to have him come off actos.  Hurt right hand back around halloween while camping. Has swelling between the index and middle fingers of the right hand. Straightening the right hand causes increased pain. Pain is shooting across the index and middle fingers.    Medications: Outpatient Medications Prior to Visit  Medication Sig Note   acetaminophen (TYLENOL) 500 MG tablet Take 1,000 mg by mouth as needed for mild pain.     aspirin EC 81 MG tablet Take 1 tablet (81 mg total) by mouth daily. Swallow whole.    atorvastatin (LIPITOR) 80 MG tablet Take 1 tablet (80 mg total) by mouth daily.    blood glucose meter kit and supplies Dispense based on patient and insurance preference. Use up to four times daily as directed. (FOR ICD-10 E10.9, E11.9).    diclofenac (CATAFLAM) 50 MG tablet Take 1 tablet (50 mg total) by mouth 3 (three) times daily.    ezetimibe (ZETIA) 10 MG tablet Take 1 tablet (10 mg total) by mouth every evening.    fenofibrate 160 MG tablet Take 1 tablet (160 mg total) by mouth daily.    gabapentin (NEURONTIN) 100 MG capsule Take 1 capsule (100 mg total) by mouth at bedtime.    glyBURIDE (DIABETA) 5 MG tablet Take 2 tablets (10 mg total) by mouth 2 (two) times daily with a meal.     HYDROcodone-acetaminophen (NORCO) 7.5-325 MG tablet Take 1 tablet by mouth every 6 (six) hours as needed for moderate pain.    HYDROcodone-acetaminophen (NORCO/VICODIN) 5-325 MG tablet Take 1 tablet by mouth every 4 (four) hours as needed for moderate pain.    isosorbide mononitrate (IMDUR) 60 MG 24 hr tablet Take 1 tablet by mouth once daily    losartan (COZAAR) 25 MG tablet Take 1 tablet by mouth once daily    metFORMIN (GLUCOPHAGE) 500 MG tablet Take 1 tablet (500 mg total) by mouth 4 (four) times daily. Two in am and 2 in pm    metoprolol tartrate (LOPRESSOR) 25 MG tablet Take 1 tablet (25 mg total) by mouth 2 (two) times daily.    nitroGLYCERIN (NITROSTAT) 0.4 MG SL tablet Place 1 tablet (0.4 mg total) under the tongue every 5 (five) minutes as needed.    omega-3 acid ethyl esters (LOVAZA) 1 g capsule Take 2 capsules (2 g total) by mouth 2 (two) times daily.    pioglitazone (ACTOS) 30 MG tablet Take 1 tablet (30 mg total) by mouth daily.    [DISCONTINUED] TRULICITY 4.58 KD/9.8PJ SOPN INJECT 0.5ML (0.$RemoveBeforeD'75MG'BSgKtNypCpsbBB$ ) INTO THE SKIN ONCE WEEKLY 05/11/2021: increased dose and weaning off actos    No facility-administered medications prior to visit.    Review of Systems  Constitutional:  Negative for activity change, chills, fatigue and fever.  HENT:  Negative  for congestion, postnasal drip, rhinorrhea, sinus pressure, sinus pain, sneezing and sore throat.   Eyes: Negative.   Respiratory:  Negative for cough, shortness of breath and wheezing.   Cardiovascular:  Negative for chest pain and palpitations.  Gastrointestinal:  Negative for constipation, diarrhea, nausea and vomiting.  Endocrine: Negative for cold intolerance, heat intolerance, polydipsia and polyuria.       Blood sugars are much improved sice adding trulicity 0.56. HgbA1c is 6.2 today, down from 9.0 at last check.  Genitourinary:  Negative for dysuria, frequency and urgency.  Musculoskeletal:  Positive for arthralgias and joint swelling.  Negative for back pain and myalgias.       Right hand. Effecting the index and middle fingers of the right hand, especially when he straightens the hand.   Skin:  Negative for rash.  Allergic/Immunologic: Negative for environmental allergies.  Neurological:  Negative for dizziness, weakness and headaches.  Psychiatric/Behavioral:  The patient is not nervous/anxious.       Objective     Today's Vitals   05/11/21 0904  BP: (!) 101/57  Pulse: 65  Temp: (!) 97.5 F (36.4 C)  SpO2: 97%  Weight: 181 lb 1.9 oz (82.2 kg)  Height: $Remove'5\' 7"'sJsZdfd$  (1.702 m)   Body mass index is 28.37 kg/m.   BP Readings from Last 3 Encounters:  05/11/21 (!) 101/57  03/03/21 (!) 154/79  02/11/21 108/66    Wt Readings from Last 3 Encounters:  05/11/21 181 lb 1.9 oz (82.2 kg)  03/03/21 183 lb 6.4 oz (83.2 kg)  02/11/21 183 lb 4.8 oz (83.1 kg)    Physical Exam Vitals and nursing note reviewed.  Constitutional:      Appearance: Normal appearance. He is well-developed.  HENT:     Head: Normocephalic and atraumatic.  Eyes:     Pupils: Pupils are equal, round, and reactive to light.  Cardiovascular:     Rate and Rhythm: Normal rate and regular rhythm.     Pulses: Normal pulses.     Heart sounds: Normal heart sounds.  Pulmonary:     Effort: Pulmonary effort is normal.     Breath sounds: Normal breath sounds.  Abdominal:     Palpations: Abdomen is soft.  Musculoskeletal:        General: Normal range of motion.     Right hand: Swelling, tenderness and bony tenderness present.     Cervical back: Normal range of motion and neck supple.     Comments: Tenderness between the proximal joints of the index and middle fingers of the right hand. Swelling is visible. Increased tenderness with palpation. Patient does have full ROM and strength of the right hand, but pain increases when he fully straightens out the hand.   Lymphadenopathy:     Cervical: No cervical adenopathy.  Skin:    General: Skin is warm and dry.      Capillary Refill: Capillary refill takes less than 2 seconds.  Neurological:     General: No focal deficit present.     Mental Status: He is alert and oriented to person, place, and time.  Psychiatric:        Mood and Affect: Mood normal.        Behavior: Behavior normal.        Thought Content: Thought content normal.        Judgment: Judgment normal.      Results for orders placed or performed in visit on 05/11/21  POCT glycosylated hemoglobin (Hb A1C)  Result Value  Ref Range   Hemoglobin A1C 6.2 (A) 4.0 - 5.6 %   HbA1c POC (<> result, manual entry)     HbA1c, POC (prediabetic range)     HbA1c, POC (controlled diabetic range)      Assessment & Plan    1. Diabetes mellitus type 2 in nonobese (HCC) Blood sugars are much improved. Will increase trulkicity to 1.$RemoveBeforeD'5mg'apcuSfDzgKNaXX$  weekly while gradually tapering off actos. Conitue glyburide and metformin as before. Recheck HgbA1c in three months - POCT glycosylated hemoglobin (Hb A1C) - Dulaglutide (TRULICITY) 1.5 GQ/6.7YP SOPN; Inject 1.5 mg into the skin once a week.  Dispense: 2 mL; Refill: 3  2. Pain in joint of right hand Get x-ray of the right hand. Refer to orthopedics as indicated  - DG Hand 2 View Right; Future  3. Dyslipidemia Stable. Conitue all lipid lowering medications as prescribed   4. Essential hypertension Stable. Continue bp medication as prescribed.     Problem List Items Addressed This Visit       Cardiovascular and Mediastinum   Essential hypertension     Endocrine   Diabetes mellitus type 2 in nonobese (HCC) - Primary   Relevant Medications   Dulaglutide (TRULICITY) 1.5 PJ/0.9TO SOPN   Other Relevant Orders   POCT glycosylated hemoglobin (Hb A1C) (Completed)     Other   Dyslipidemia   Pain in joint of right hand   Relevant Orders   DG Hand 2 View Right     Return in about 3 months (around 08/08/2021) for diabetes with HgbA1c check.         Ronnell Freshwater, NP  [facility] at  [facility] [phone] (phone) [phone] (fax)  [place]

## 2021-05-11 NOTE — Patient Instructions (Signed)
For next three weeks, cut actos in half and continue all other medications as prescribed  Once you increase trulicity to 1.5mg  weekly, you may discontinue actos and continue the other medications as prescribed  Your HgbA1c was 6.2 today We will recheck your HgbA1c in three months  An x-ray of your right hand has been ordered

## 2021-05-17 ENCOUNTER — Other Ambulatory Visit: Payer: Self-pay | Admitting: Nurse Practitioner

## 2021-05-25 ENCOUNTER — Other Ambulatory Visit: Payer: Self-pay | Admitting: Radiology

## 2021-05-25 MED ORDER — HYDROCODONE-ACETAMINOPHEN 5-325 MG PO TABS: 1.0000 | ORAL_TABLET | Freq: Four times a day (QID) | ORAL | 0 refills | Status: DC | PRN

## 2021-06-01 ENCOUNTER — Other Ambulatory Visit: Payer: Self-pay | Admitting: Nurse Practitioner

## 2021-06-01 DIAGNOSIS — E119 Type 2 diabetes mellitus without complications: Secondary | ICD-10-CM

## 2021-06-02 ENCOUNTER — Telehealth: Payer: Self-pay | Admitting: Nurse Practitioner

## 2021-06-02 ENCOUNTER — Other Ambulatory Visit: Payer: Self-pay | Admitting: Nurse Practitioner

## 2021-06-02 DIAGNOSIS — E119 Type 2 diabetes mellitus without complications: Secondary | ICD-10-CM

## 2021-06-02 MED ORDER — TRULICITY 0.75 MG/0.5ML ~~LOC~~ SOAJ: 0.7500 mg | SUBCUTANEOUS | 1 refills | Status: DC

## 2021-06-02 NOTE — Telephone Encounter (Signed)
Changed  Trulicity dosing back to 0.75mg  weekly and sent to [facility]

## 2021-06-02 NOTE — Telephone Encounter (Signed)
Patient is aware 

## 2021-06-02 NOTE — Telephone Encounter (Signed)
A new prescription of Trulicity was sent over for him on 02/14 however he cannot afford it and wants to know if you will just put him back on the lower dosage because he can pay for that one. [phone] ?

## 2021-06-02 NOTE — Progress Notes (Signed)
Changed  Trulicity dosing back to 0.75mg weekly and sent to [facility]

## 2021-06-15 ENCOUNTER — Other Ambulatory Visit: Payer: Self-pay | Admitting: Nurse Practitioner

## 2021-06-15 DIAGNOSIS — E119 Type 2 diabetes mellitus without complications: Secondary | ICD-10-CM

## 2021-06-18 ENCOUNTER — Ambulatory Visit: Payer: 59 | Admitting: Specialist

## 2021-06-25 ENCOUNTER — Encounter: Payer: Self-pay | Admitting: Specialist

## 2021-06-25 ENCOUNTER — Ambulatory Visit (INDEPENDENT_AMBULATORY_CARE_PROVIDER_SITE_OTHER): Payer: 59 | Admitting: Specialist

## 2021-06-25 VITALS — BP 114/70 | HR 66 | Ht 67.0 in | Wt 181.0 lb

## 2021-06-25 DIAGNOSIS — M48062 Spinal stenosis, lumbar region with neurogenic claudication: Secondary | ICD-10-CM

## 2021-06-25 DIAGNOSIS — M21372 Foot drop, left foot: Secondary | ICD-10-CM

## 2021-06-25 DIAGNOSIS — Z01818 Encounter for other preprocedural examination: Secondary | ICD-10-CM

## 2021-06-25 MED ORDER — HYDROCODONE-ACETAMINOPHEN 5-325 MG PO TABS: 1.0000 | ORAL_TABLET | Freq: Four times a day (QID) | ORAL | 0 refills | Status: DC | PRN

## 2021-06-25 NOTE — Progress Notes (Addendum)
? ?Office Visit Note ?  ?Patient: Mario Arnold           ?Date of Birth: 05/01/1955           ?MRN: [id number] ?Visit Date: 06/25/2021 ?             ?Requested by: Carlean Jews, NP ?[phone] ?[address] ?[place],  [place] [address] ?PCP: Carlean Jews, NP ? ? ?Assessment & Plan: ?Visit Diagnoses:  ?1. Encounter for preoperative assessment   ?2. Spinal stenosis of lumbar region with neurogenic claudication   ?3. Left foot drop   ? ? ?Plan: Avoid bending, stooping and avoid lifting weights greater than 10 lbs. ?Avoid prolong standing and walking. ?Order for a new walker with wheels. ?Surgery scheduling secretary Tivis Ringer, will call you in the next week to schedule for surgery.  ?Surgery recommended is a L2-3,L3-4, L4-5 and L5-S1 level lumbar laminectomy this would be done with operating room microscope. ?Take hydrocodone for for pain. ?Risk of surgery includes risk of infection 1 in 100 patients, bleeding 1/2% chance you would need a transfusion.   Risk to the nerves is one in 10,000. ?Recommend that you have your diabetes treated and surgery will be done when your HbA1c is less than 8.0. Please see your cardiologist Dr. Swaziland for preoperative evaluation.  Expect relief of leg pain but numbness ?may persist depending on the length and degree of pressure that has been present. ?Gabapentin for nerve pain. ?Diclofenac 50 mg po up to TID for arthritis. ?Hydrocodone for chronic nerve pain and neurogenic pain left leg.  ?Follow-Up Instructions: No follow-ups on file.  ?  ? ?Follow-Up Instructions: Return in about 4 weeks (around 07/23/2021).  ? ?Orders:  ?Orders Placed This Encounter  ?Procedures  ? Ambulatory referral to Cardiology  ? ?Meds ordered this encounter  ?Medications  ? HYDROcodone-acetaminophen (NORCO/VICODIN) 5-325 MG tablet  ?  Sig: Take 1 tablet by mouth every 6 (six) hours as needed for moderate pain.  ?  Dispense:  30 tablet  ?  Refill:  0  ? ? ? ? Procedures: ?No procedures  performed ? ? ?Clinical Data: ?Findings:  ?Study Result ? ?Narrative & Impression ?CLINICAL DATA:  66 year old male with persistent low back pain. Left ?foot drop. ?  ?EXAM: ?MRI LUMBAR SPINE WITHOUT CONTRAST ?  ?TECHNIQUE: ?Multiplanar, multisequence MR imaging of the lumbar spine was ?performed. No intravenous contrast was administered. ?  ?COMPARISON:  Lumbar MRI 01/01/2019.  Lumbar radiographs 10/13/2020. ?  ?FINDINGS: ?Segmentation: Normal on the comparison radiographs which is the same ?numbering system used on the 2020 MRI. ?  ?Alignment: Mild dextroconvex lumbar scoliosis. Straightening of ?lumbar lordosis and subtle retrolisthesis of L1 on L2 appear stable ?since 2020. ?  ?Vertebrae: Patchy degenerative endplate marrow edema is new at L5-S1 ?eccentric to the left, but similar endplate edema has largely ?resolved since 2020 at L1-L2. There is mild new anterior superior L1 ?endplate marrow edema. Other chronic degenerative endplate marrow ?signal changes. Background bone marrow signal remains within normal ?limits. Intact visible sacrum and SI joints. ?  ?Conus medullaris and cauda equina: Conus extends to the T12-L1 ?level, see below. No lower spinal cord or conus signal abnormality. ?  ?Paraspinal and other soft tissues: Asymmetric atrophy of the right ?erector spinae muscles at L5-S1. Stable and negative visible ?abdominal viscera. ?  ?Disc levels: ?  ?T11-T12: Mild disc bulge and facet hypertrophy. No significant ?stenosis. ?  ?T12-L1: Chronic but increased and bulky left paracentral  disc ?protrusion (series 6, image 3) superimposed on circumferential disc ?bulging. Mild spinal stenosis and mild mass effect on the conus ?appear slightly increased since 2020. Moderate left lateral recess ?stenosis (left L1 nerve level). ?  ?L1-L2: Bulky chronic circumferential disc osteophyte complex ?eccentric to the left. Stable mild posterior element hypertrophy, ?mild spinal and left greater than right lateral recess  stenosis. ?Stable mild to moderate left L1 foraminal stenosis. ?  ?L2-L3: Bulky circumferential disc bulge and endplate spurring ?eccentric to the left with chronic left far lateral component. ?Moderate facet and ligament flavum hypertrophy. Moderate spinal ?stenosis and mild to moderate lateral recess stenosis greater on the ?left appear stable. Stable mild to moderate left L2 foraminal ?stenosis. ?  ?L3-L4: Circumferential disc bulge and endplate spurring with ?moderate to severe facet and ligament flavum hypertrophy. Spinal ?stenosis here appears mild and stable, although with increased ?redundancy of the cauda equina nerve roots when compared to 2020. ?Moderate right L3 neural foraminal stenosis appears increased on ?series 3, image 7. ?  ?L4-L5: Bulky chronic circumferential disc osteophyte complex ?eccentric to the right. Moderate to severe facet and ligament flavum ?hypertrophy greater on the right. Moderate spinal and mild to ?moderate lateral recess stenosis greater on the right appears ?stable. Stable mild to moderate right L4 foraminal stenosis. ?  ?L5-S1: Circumferential disc bulge with endplate spurring and ?moderate facet and ligament flavum hypertrophy. No spinal or ?convincing lateral recess stenosis. Moderate to severe bilateral L5 ?foraminal stenosis appears increased on the left, and this is in ?part related to degenerative 5-6 mm synovial cyst projecting ?anteriorly from the left facets which is increased, was subtle in ?2020. See series 6, image 33 and series 4, image 15. ?  ?IMPRESSION: ?1. Widespread advanced spine degeneration from the thoracolumbar ?junction to the lumbosacral junction, stable since the 2020 MRI with ?the following exceptions: ?  ?2. L5-S1, which might be the symptomatic level with regard to left ?foot drop: - increased moderate to severe left neural foraminal ?stenosis in part due to a degenerative synovial 5-6 mm cyst ?projecting anteriorly from the facet. Query left L5  radiculitis. ?Associated new leftward degenerative endplate marrow edema at this ?level. ?  ?3. Increased chronic left paracentral disc protrusion at T12-L1 with ?increased mild spinal stenosis and mild mass effect on the conus. ?Increased moderate left lateral recess stenosis. ?  ?4. Up to moderate chronic multifactorial spinal stenosis at L2-L3 ?thorugh L4-L5, with up to moderate lateral recess and foraminal ?stenosis at those levels. ?  ?  ?Electronically Signed ?  By: Odessa FlemingH  Hall M.D. ?  On: 10/24/2020 09:33 ?  ? ? ? ?Subjective: ?Chief Complaint  ?Patient presents with  ? Lower Back - Follow-up  ?  Doing the same, no worse  ? ? ?66 year old male with history of lumbar spinal stenosis with left leg weakness and foot drop. He was seen in early December and has been having difficulty controlling his blood sugars. His primary care physician now had his HgbA1c at about 6.3 which is an acceptable value for considering surgical intervention. He has a significant history of CAD and Carotid artery narrowing 50%. He has not been able to see Dr. Peter SwazilandJordan concerning surgical clearance. He is still having severe back and left leg pain. He is walking with a left leg limp due to the left leg pain and weakness. Taking narcotics and gabapentin to control pain as well as diclofenac. No bowel or bladder difficulty. Unable to ambulate more than 100 feet  due to left leg pain and weekness. Difficulty standing upright and tends to stoop. Leaning on furniture to assist in walking and stooping.  ? ? ?Review of Systems  ?Constitutional: Negative.   ?HENT: Negative.    ?Eyes: Negative.   ?Respiratory: Negative.    ?Cardiovascular: Negative.   ?Gastrointestinal: Negative.   ?Endocrine: Negative.   ?Genitourinary: Negative.   ?Musculoskeletal: Negative.   ?Skin: Negative.   ?Allergic/Immunologic: Negative.   ?Neurological: Negative.   ?Hematological: Negative.   ?Psychiatric/Behavioral: Negative.    ? ? ?Objective: ?Vital Signs: BP 114/70  (BP Location: Left Arm, Patient Position: Sitting)   Pulse 66   Ht 5\' 7"  (1.702 m)   Wt 181 lb (82.1 kg)   BMI 28.35 kg/m?  ? ?Physical Exam ?Constitutional:   ?   Appearance: He is well-developed.  ?HENT:  ?   Head: Norm

## 2021-06-29 ENCOUNTER — Telehealth: Payer: Self-pay | Admitting: *Deleted

## 2021-06-29 NOTE — Telephone Encounter (Signed)
Call placed to pt regarding surgical clearance and the need for an appointment with Dr. Swaziland or 1st availabale APP. ?Left a message for the pt to call back, please schedule.  ?

## 2021-06-29 NOTE — Telephone Encounter (Signed)
? ?  Name: Mario Arnold  ?DOB: 05-01-1955  ?MRN: [id number] ? ?Primary Cardiologist: Peter Swaziland, MD ? ?Chart reviewed as part of pre-operative protocol coverage. Because of Mario Arnold's past medical history and time since last visit, he will require a follow-up visit in order to better assess preoperative cardiovascular risk. ? ?Pre-op covering staff: ?- Please schedule appointment and call patient to inform them. If patient already had an upcoming appointment within acceptable timeframe, please add "pre-op clearance" to the appointment notes so provider is aware. ?- Please contact requesting surgeon's office via preferred method (i.e, phone, fax) to inform them of need for appointment prior to surgery. ? ?If applicable, this message will also be routed to pharmacy pool and/or primary cardiologist for input on holding anticoagulant/antiplatelet agent as requested below so that this information is available to the clearing provider at time of patient's appointment.  ? ?Azalee Course, [place]  ?06/29/2021, 12:57 PM  ? ?

## 2021-06-29 NOTE — Telephone Encounter (Signed)
Pt has been scheduled to see Dr. Martinique, pt's preference, 09/08/21, clearance will be addressed at that time. ? ?Will route back to the requesting surgeon's office to make them aware. ?

## 2021-06-29 NOTE — Telephone Encounter (Signed)
? ?  Pre-operative Risk Assessment  ?  ?Patient Name: Mario Arnold  ?DOB: 01/17/1956 ?MRN: [id number]  ? ?  ? ?Request for Surgical Clearance   ? ?Procedure:   LUMBAR LAMINECTOMIES ? ?Date of Surgery:  Clearance TBD                              ?   ?Surgeon:  DR. Basil Dess ?Surgeon's Group or Practice Name:  [facility] ?Phone number:  [phone] ?Fax number:  [phone] ?  ?Type of Clearance Requested:   ?- Pharmacy:  Hold Aspirin NOT INDICATED ?  ?Type of Anesthesia:  General  ?  ?Additional requests/questions:   ? ?Signed, ?Jeanann Lewandowsky   ?06/29/2021, 12:05 PM  ? ?

## 2021-07-15 ENCOUNTER — Other Ambulatory Visit: Payer: Self-pay

## 2021-07-15 DIAGNOSIS — E119 Type 2 diabetes mellitus without complications: Secondary | ICD-10-CM

## 2021-07-15 MED ORDER — GLYBURIDE 5 MG PO TABS: ORAL_TABLET | ORAL | 2 refills | Status: DC

## 2021-07-23 ENCOUNTER — Encounter: Payer: Self-pay | Admitting: Specialist

## 2021-07-23 ENCOUNTER — Ambulatory Visit (INDEPENDENT_AMBULATORY_CARE_PROVIDER_SITE_OTHER): Payer: 59 | Admitting: Specialist

## 2021-07-23 VITALS — BP 105/65 | HR 62 | Ht 67.0 in | Wt 181.0 lb

## 2021-07-23 DIAGNOSIS — M48062 Spinal stenosis, lumbar region with neurogenic claudication: Secondary | ICD-10-CM | POA: Diagnosis not present

## 2021-07-23 DIAGNOSIS — M545 Low back pain, unspecified: Secondary | ICD-10-CM

## 2021-07-23 DIAGNOSIS — E1142 Type 2 diabetes mellitus with diabetic polyneuropathy: Secondary | ICD-10-CM | POA: Diagnosis not present

## 2021-07-23 DIAGNOSIS — M21372 Foot drop, left foot: Secondary | ICD-10-CM

## 2021-07-23 DIAGNOSIS — Z794 Long term (current) use of insulin: Secondary | ICD-10-CM

## 2021-07-23 DIAGNOSIS — M5116 Intervertebral disc disorders with radiculopathy, lumbar region: Secondary | ICD-10-CM

## 2021-07-23 DIAGNOSIS — G8929 Other chronic pain: Secondary | ICD-10-CM

## 2021-07-23 DIAGNOSIS — R29898 Other symptoms and signs involving the musculoskeletal system: Secondary | ICD-10-CM

## 2021-07-23 MED ORDER — HYDROCODONE-ACETAMINOPHEN 5-325 MG PO TABS: 1.0000 | ORAL_TABLET | Freq: Four times a day (QID) | ORAL | 0 refills | Status: DC | PRN

## 2021-07-23 NOTE — Progress Notes (Signed)
? ?Office Visit Note ?  ?Patient: Mario Arnold           ?Date of Birth: 02/25/1956           ?MRN: [id number] ?Visit Date: 07/23/2021 ?             ?Requested by: Carlean JewsBoscia, Heather E, NP ?[phone] ?[address] ?[place],  [place] [id number] ?PCP: Carlean JewsBoscia, Heather E, NP ? ? ?Assessment & Plan: ?Visit Diagnoses:  ?1. Spinal stenosis of lumbar region with neurogenic claudication   ?2. Left foot drop   ?3. Type 2 diabetes mellitus with diabetic polyneuropathy, with long-term current use of insulin (HCC)   ?4. Lumbar disc disease with radiculopathy   ?5. Chronic low back pain, unspecified back pain laterality, unspecified whether sciatica present   ?6. Bilateral leg weakness   ? ? ?Plan: Plan: Avoid bending, stooping and avoid lifting weights greater than 10 lbs. ?Avoid prolong standing and walking. ?Order for a new walker with wheels. ?Surgery scheduling secretary Tivis RingerSherri Billings, will call you in the next week to schedule for surgery.  ?Surgery recommended is a L2-3,L3-4, L4-5 and L5-S1 level lumbar laminectomy this would be done with operating room microscope. ?Take hydrocodone for for pain. ?Risk of surgery includes risk of infection 1 in 100 patients, bleeding 1/2% chance you would need a transfusion.   Risk to the nerves is one in 10,000. ?Recommend that you have your diabetes treated and surgery will be done when your HbA1c is less than 8.0. Please see your cardiologist Dr. SwazilandJordan for preoperative evaluation.  Expect relief of leg pain but numbness ?may persist depending on the length and degree of pressure that has been present. ?Gabapentin for nerve pain. ?Diclofenac 50 mg po up to TID for arthritis. ?Hydrocodone for chronic nerve pain and neurogenic pain left leg.  ?Follow-Up Instructions: No follow-ups on file. ? ?Follow-Up Instructions: No follow-ups on file.  ? ?Orders:  ?No orders of the defined types were placed in this encounter. ? ?No orders of the defined types were placed in this encounter. ? ? ? ?  Procedures: ?No procedures performed ? ? ?Clinical Data: ?No additional findings. ? ? ?Subjective: ?Chief Complaint  ?Patient presents with  ? Lower Back - Pain, Follow-up  ? ? ?66 year old male with history of low back pain and neurogenic claudication symptoms. Seen here after Dr. Ollen BowlHarkins [facility] pain management left. He complains of weakness in his leg with standing and walking. He is caring for his house and tries to stay busy and active but when he does work he has to take  hydrocodone one table twice a day. On occasion he can get by with tylenol. He likes to stay active and goes camping once in a while. Right heel with constant numbness and the left leg is weaker than the right. He is to see Dr. SwazilandJordan for a preop evaluation and for clearance to consider a 4 level lumbar  ?Laminectomy. ? ? ?Review of Systems  ?Constitutional: Negative.   ?HENT: Negative.    ?Eyes: Negative.   ?Respiratory: Negative.    ?Cardiovascular: Negative.   ?Gastrointestinal: Negative.   ?Endocrine: Negative.   ?Genitourinary: Negative.   ?Musculoskeletal: Negative.   ?Skin: Negative.   ?Allergic/Immunologic: Negative.   ?Neurological: Negative.   ?Hematological: Negative.   ?Psychiatric/Behavioral: Negative.    ? ? ?Objective: ?Vital Signs: BP 105/65   Pulse 62   Ht 5\' 7"  (1.702 m)   Wt 181 lb (82.1 kg)  BMI 28.35 kg/m?  ? ?Physical Exam ?Constitutional:   ?   Appearance: He is well-developed.  ?HENT:  ?   Head: Normocephalic and atraumatic.  ?Eyes:  ?   Pupils: Pupils are equal, round, and reactive to light.  ?Pulmonary:  ?   Effort: Pulmonary effort is normal.  ?   Breath sounds: Normal breath sounds.  ?Abdominal:  ?   General: Bowel sounds are normal.  ?   Palpations: Abdomen is soft.  ?Musculoskeletal:  ?   Cervical back: Normal range of motion and neck supple.  ?   Lumbar back: Negative right straight leg raise test and negative left straight leg raise test.  ?Skin: ?   General: Skin is warm and dry.  ?Neurological:  ?   Mental  Status: He is alert and oriented to person, place, and time.  ?Psychiatric:     ?   Behavior: Behavior normal.     ?   Thought Content: Thought content normal.     ?   Judgment: Judgment normal.  ? ? ?Back Exam  ? ?Tenderness  ?The patient is experiencing tenderness in the lumbar. ? ?Range of Motion  ?Extension:  abnormal  ?Flexion:  abnormal  ?Lateral bend right:  abnormal  ?Lateral bend left:  abnormal  ?Rotation right:  abnormal  ?Rotation left:  abnormal  ? ?Muscle Strength  ?Right Quadriceps:  5/5  ?Left Quadriceps:  5/5  ?Right Hamstrings:  5/5  ?Left Hamstrings:  5/5  ? ?Tests  ?Straight leg raise right: negative ?Straight leg raise left: negative ? ?Reflexes  ?Patellar:  2/4 ?Achilles:  2/4 ?Babinski's sign: normal  ? ?Other  ?Toe walk: abnormal ?Heel walk: abnormal ? ?Comments:  Bilateral foot DF weakness left greater than right.  ? ? ? ?Specialty Comments:  ?No specialty comments available. ? ?Imaging: ?No results found. ? ? ?PMFS History: ?Patient Active Problem List  ? Diagnosis Date Noted  ? Pain in joint of right hand 05/11/2021  ? Essential hypertension 05/11/2021  ? Body mass index 28.0-28.9, adult 02/21/2021  ? Encounter for general adult medical examination with abnormal findings 10/31/2020  ? Screening for colon cancer 10/31/2020  ? Encounter to establish care 09/29/2020  ? Lumbar disc disease with radiculopathy 09/29/2020  ? Healthcare maintenance 09/29/2020  ? Screening for prostate cancer 09/29/2020  ? Carotid disease, bilateral (HCC) 10/06/2011  ? Thyroid nodule 10/06/2011  ? Coronary artery disease   ? Diabetes mellitus type 2 in nonobese [facility])   ? Dyslipidemia   ? History of transient ischemic attack (TIA)   ? ?Past Medical History:  ?Diagnosis Date  ? Arthritis   ? Carotid disease, bilateral [facility]) June 2013  ? less than 50% stenosis in the right and left   ? Chronic kidney disease 2017  ? stones  ? Coronary artery disease   ? STATUS POST ANGIOPLASTY OF THE LAD   ? Diabetes mellitus   ? TYPE  2  ? Dyslipidemia   ? History of transient ischemic attack (TIA)   ? on aggrenox  ? Hyperlipidemia   ? Myocardial infarction [facility]) 1999  ? stent placement- per patient has 2 stents  ? Stroke [facility])   ? Thyroid nodule June 2013  ? had [place] with 4 mm small cystic nodule noted. Needs follow up US in 12 months. TSH is normal.   ?  ?Family History  ?Problem Relation Age of Onset  ? Coronary artery disease Mother   ? Heart attack Mother   ?  Diabetes Mother   ? Transient ischemic attack Mother   ? Heart disease Mother   ? Cancer Father   ? Colon cancer Neg Hx   ? Esophageal cancer Neg Hx   ? Colon polyps Neg Hx   ? Rectal cancer Neg Hx   ? Stomach cancer Neg Hx   ?  ?Past Surgical History:  ?Procedure Laterality Date  ? COLONOSCOPY    ? 13 years ago in May normal exam per pt  ? CORONARY ANGIOPLASTY WITH STENT PLACEMENT  03/28/2001  ? LAD. EJECTION FRACTION IS 45%  ? SHOULDER ARTHROSCOPY WITH OPEN ROTATOR CUFF REPAIR AND DISTAL CLAVICLE ACROMINECTOMY Right 09/05/2012  ? Procedure: RIGHT SHOULDER ARTHROSCOPY WITH LABRAL DEBRIDEMENT AND OPEN DISTAL CLAVICLE RESECTION, acromionectomy  AND OPEN ROTATOR CUFF REPAIR;  Surgeon: Drucilla Schmidt, MD;  Location: [facility];  Service: Orthopedics;  Laterality: Right;  ? ?Social History  ? ?Occupational History  ? Occupation: Repair semi trailers.  ?Tobacco Use  ? Smoking status: Former  ?  Packs/day: 1.50  ?  Years: 23.00  ?  Pack years: 34.50  ?  Types: Cigarettes  ?  Quit date: 03/28/1997  ?  Years since quitting: 24.3  ? Smokeless tobacco: Former  ?  Types: Snuff, Chew  ? Tobacco comments:  ?  as a teen  ?Vaping Use  ? Vaping Use: Never used  ?Substance and Sexual Activity  ? Alcohol use: Yes  ?  Alcohol/week: 2.0 standard drinks  ?  Types: 2 Cans of beer per week  ?  Comment: socially  ? Drug use: No  ? Sexual activity: Not Currently  ? ? ? ? ? ? ?

## 2021-07-23 NOTE — Addendum Note (Signed)
Addended by: Vira Browns on: 07/23/2021 09:44 AM ? ? Modules accepted: Orders ? ?

## 2021-08-04 ENCOUNTER — Other Ambulatory Visit: Payer: Self-pay | Admitting: Nurse Practitioner

## 2021-08-04 DIAGNOSIS — I25119 Atherosclerotic heart disease of native coronary artery with unspecified angina pectoris: Secondary | ICD-10-CM

## 2021-08-10 ENCOUNTER — Encounter: Payer: Self-pay | Admitting: Nurse Practitioner

## 2021-08-10 ENCOUNTER — Telehealth: Payer: Self-pay | Admitting: Nurse Practitioner

## 2021-08-10 ENCOUNTER — Ambulatory Visit (INDEPENDENT_AMBULATORY_CARE_PROVIDER_SITE_OTHER): Payer: 59 | Admitting: Nurse Practitioner

## 2021-08-10 VITALS — BP 103/63 | HR 62 | Temp 97.2°F | Ht 66.93 in | Wt 179.8 lb

## 2021-08-10 DIAGNOSIS — Z6828 Body mass index (BMI) 28.0-28.9, adult: Secondary | ICD-10-CM

## 2021-08-10 DIAGNOSIS — I25119 Atherosclerotic heart disease of native coronary artery with unspecified angina pectoris: Secondary | ICD-10-CM

## 2021-08-10 DIAGNOSIS — E119 Type 2 diabetes mellitus without complications: Secondary | ICD-10-CM | POA: Diagnosis not present

## 2021-08-10 DIAGNOSIS — I10 Essential (primary) hypertension: Secondary | ICD-10-CM

## 2021-08-10 DIAGNOSIS — E785 Hyperlipidemia, unspecified: Secondary | ICD-10-CM

## 2021-08-10 LAB — POCT UA - MICROALBUMIN
Albumin/Creatinine Ratio, Urine, POC: <30
Creatinine, POC: 100 mg/dL
Microalbumin Ur, POC: 30 mg/L

## 2021-08-10 MED ORDER — SEMAGLUTIDE (1 MG/DOSE) 4 MG/3ML ~~LOC~~ SOPN: 1.0000 mg | PEN_INJECTOR | SUBCUTANEOUS | 2 refills | Status: DC

## 2021-08-10 NOTE — Progress Notes (Signed)
Established patient visit ? ? ?Patient: Mario Arnold   DOB: 07-21-1955   66 y.o. Male  MRN: [id number] ?Visit Date: 08/10/2021 ? ? ?Chief Complaint  ?Patient presents with  ? Diabetes  ? ?Subjective  ?  ?HPI  ?Follow up diabetes.  ?-recent HgbA1c was 6.2. due to have check of HgbA1c today and urine microalbumin.  ?-had to stop taking trulicity 9.4TM weekly due to expense. States that It cost >$800. Has not taken this in about 2 months. Unsure what alternative is covered per his insurance.  ?-states that he is dong well otherwise. No other concerns or complaints today.  ?denies chest pain, chest pressure, or shortness of breath. He denies headaches or visual disturbances. He denies abdominal pain, nausea, vomiting, or changes in bowel or bladder habits.   ? ? ? ?Medications: ?Outpatient Medications Prior to Visit  ?Medication Sig  ? acetaminophen (TYLENOL) 500 MG tablet Take 1,000 mg by mouth as needed for mild pain.   ? aspirin EC 81 MG tablet Take 1 tablet (81 mg total) by mouth daily. Swallow whole.  ? atorvastatin (LIPITOR) 80 MG tablet Take 1 tablet (80 mg total) by mouth daily.  ? blood glucose meter kit and supplies Dispense based on patient and insurance preference. Use up to four times daily as directed. (FOR ICD-10 E10.9, E11.9).  ? diclofenac (CATAFLAM) 50 MG tablet Take 1 tablet (50 mg total) by mouth 3 (three) times daily.  ? ezetimibe (ZETIA) 10 MG tablet Take 1 tablet (10 mg total) by mouth every evening.  ? fenofibrate 160 MG tablet Take 1 tablet (160 mg total) by mouth daily.  ? gabapentin (NEURONTIN) 100 MG capsule Take 1 capsule (100 mg total) by mouth at bedtime.  ? glyBURIDE (DIABETA) 5 MG tablet TAKE 2 TABLETS BY MOUTH TWICE DAILY WITH A MEAL  ? HYDROcodone-acetaminophen (NORCO/VICODIN) 5-325 MG tablet Take 1 tablet by mouth every 6 (six) hours as needed for moderate pain.  ? isosorbide mononitrate (IMDUR) 60 MG 24 hr tablet Take 1 tablet by mouth once daily  ? losartan (COZAAR) 25 MG tablet Take  1 tablet by mouth once daily  ? metFORMIN (GLUCOPHAGE) 500 MG tablet Take 1 tablet (500 mg total) by mouth 4 (four) times daily. Two in am and 2 in pm  ? metoprolol tartrate (LOPRESSOR) 25 MG tablet Take 1 tablet (25 mg total) by mouth 2 (two) times daily.  ? nitroGLYCERIN (NITROSTAT) 0.4 MG SL tablet Place 1 tablet (0.4 mg total) under the tongue every 5 (five) minutes as needed.  ? omega-3 acid ethyl esters (LOVAZA) 1 g capsule Take 2 capsules (2 g total) by mouth 2 (two) times daily.  ? pioglitazone (ACTOS) 30 MG tablet Take 1 tablet (30 mg total) by mouth daily.  ? [DISCONTINUED] Dulaglutide (TRULICITY) 5.46 TK/3.5WS SOPN Inject 0.75 mg into the skin once a week.  ? ?No facility-administered medications prior to visit.  ? ? ?Review of Systems  ?Constitutional:  Negative for activity change, chills, fatigue and fever.  ?HENT:  Negative for congestion, postnasal drip, rhinorrhea, sinus pressure, sinus pain, sneezing and sore throat.   ?Eyes: Negative.   ?Respiratory:  Negative for cough, shortness of breath and wheezing.   ?Cardiovascular:  Negative for chest pain and palpitations.  ?Gastrointestinal:  Negative for constipation, diarrhea, nausea and vomiting.  ?Endocrine: Negative for cold intolerance, heat intolerance, polydipsia and polyuria.  ?     Unsure how blood sugars are running as he has not taken trulicity in some  time. Unable to afford the high cost   ?Genitourinary:  Negative for dysuria, frequency and urgency.  ?Musculoskeletal:  Negative for back pain and myalgias.  ?Skin:  Negative for rash.  ?Allergic/Immunologic: Negative for environmental allergies.  ?Neurological:  Negative for dizziness, weakness and headaches.  ?Psychiatric/Behavioral:  The patient is not nervous/anxious.   ? ? ? ? Objective  ?  ? ?Today's Vitals  ? 08/10/21 0935  ?BP: 103/63  ?Pulse: 62  ?Temp: (!) 97.2 ?F (36.2 ?C)  ?SpO2: 96%  ?Weight: 179 lb 12.8 oz (81.6 kg)  ?Height: 5' 6.93" (1.7 m)  ? ?Body mass index is 28.22 kg/m?.   ? ?BP Readings from Last 3 Encounters:  ?08/10/21 103/63  ?07/23/21 105/65  ?06/25/21 114/70  ?  ?Wt Readings from Last 3 Encounters:  ?08/10/21 179 lb 12.8 oz (81.6 kg)  ?07/23/21 181 lb (82.1 kg)  ?06/25/21 181 lb (82.1 kg)  ?  ?Physical Exam ?Vitals and nursing note reviewed.  ?Constitutional:   ?   Appearance: Normal appearance. He is well-developed.  ?HENT:  ?   Head: Normocephalic and atraumatic.  ?Eyes:  ?   Pupils: Pupils are equal, round, and reactive to light.  ?Neck:  ?   Vascular: No carotid bruit.  ?Cardiovascular:  ?   Rate and Rhythm: Normal rate and regular rhythm.  ?   Pulses: Normal pulses.  ?   Heart sounds: Normal heart sounds.  ?Pulmonary:  ?   Effort: Pulmonary effort is normal.  ?   Breath sounds: Normal breath sounds.  ?Abdominal:  ?   Palpations: Abdomen is soft.  ?Musculoskeletal:     ?   General: Normal range of motion.  ?   Cervical back: Normal range of motion and neck supple.  ?Lymphadenopathy:  ?   Cervical: No cervical adenopathy.  ?Skin: ?   General: Skin is warm and dry.  ?   Capillary Refill: Capillary refill takes less than 2 seconds.  ?Neurological:  ?   General: No focal deficit present.  ?   Mental Status: He is alert and oriented to person, place, and time.  ?Psychiatric:     ?   Mood and Affect: Mood normal.     ?   Behavior: Behavior normal.     ?   Thought Content: Thought content normal.     ?   Judgment: Judgment normal.  ?  ? ? ?Results for orders placed or performed in visit on 08/10/21  ?POCT UA - Microalbumin  ?Result Value Ref Range  ? Microalbumin Ur, POC 30 mg/L  ? Creatinine, POC 100 mg/dL  ? Albumin/Creatinine Ratio, Urine, POC <30   ? ? Assessment & Plan  ?  ?1. Diabetes mellitus type 2 in nonobese [facility]) ?Urine microalbumin negative/normal today. HgbA1c drawn. Will notify patient when result is back. Continue oral medications for diabetes as prescribed. Change trulicity to ozempic 75m weekly. A new prescription was sent to his pharmacy. Recheck HgbA1c in 3  months  ?- Hemoglobin A1c; Future ?- POCT UA - Microalbumin ?- Hemoglobin A1c ?- Semaglutide, 1 MG/DOSE, 4 MG/3ML SOPN; Inject 1 mg as directed once a week.  Dispense: 3 mL; Refill: 2 ? ?2. Essential hypertension ?Stable. Continue bp medication as prescribed  ? ?3. Dyslipidemia ?Continue cholesterol lowering medication as prescribed  ? ?4. Coronary artery disease involving native coronary artery of native heart with angina pectoris ([facility] ?Regular visits with cardiology as scheduled.  ? ?5. Body mass index 28.0-28.9, adult ?Encourage patient to  limit calorie intake to 2000 cal/day or less.  He should consume a low cholesterol, low-fat diet.   ? ? ?Problem List Items Addressed This Visit   ? ?  ? Cardiovascular and Mediastinum  ? Coronary artery disease  ? Essential hypertension  ?  ? Endocrine  ? Diabetes mellitus type 2 in nonobese [facility]) - Primary  ? Relevant Medications  ? Semaglutide, 1 MG/DOSE, 4 MG/3ML SOPN  ? Other Relevant Orders  ? Hemoglobin A1c  ? POCT UA - Microalbumin (Completed)  ?  ? Other  ? Dyslipidemia  ? Body mass index 28.0-28.9, adult  ?  ? ?Return in about 3 months (around 11/10/2021) for health maintenance exam, FBW at time of visit with PSA .  ?   ? ? ? ? ?Ronnell Freshwater, NP  ?[facility] ?[phone] (phone) ?[phone] (fax) ? ?[facility]  ?

## 2021-08-10 NOTE — Telephone Encounter (Signed)
Patient called and stated the Ozempic was $900 with insurance so he cannot afford that. Can you please change it to something different? ?

## 2021-08-11 ENCOUNTER — Other Ambulatory Visit: Payer: Self-pay | Admitting: Nurse Practitioner

## 2021-08-11 DIAGNOSIS — E119 Type 2 diabetes mellitus without complications: Secondary | ICD-10-CM

## 2021-08-11 LAB — HEMOGLOBIN A1C
Est. average glucose Bld gHb Est-mCnc: 226 mg/dL
Hgb A1c MFr Bld: 9.5 % — ABNORMAL HIGH (ref 4.8–5.6)

## 2021-08-11 MED ORDER — PIOGLITAZONE HCL 45 MG PO TABS: 45.0000 mg | ORAL_TABLET | Freq: Every day | ORAL | 3 refills | Status: DC

## 2021-08-11 NOTE — Progress Notes (Signed)
HgbA1c 9.5. very high since having to stop taking trulicity due to cost. Ozempic not much better with cost. Increased actos to 45mg  daily. Continue glyburide and metformin as prescribed. Recheck HgbA1c at next visit in 3 months.  ?

## 2021-08-11 NOTE — Progress Notes (Signed)
Please let the patient know that I have Increased actos to 45mg  daily. Continue glyburide and metformin as prescribed. Recheck HgbA1c at next visit in 3 months. Thanks so much.   -HB

## 2021-08-23 ENCOUNTER — Other Ambulatory Visit: Payer: Self-pay | Admitting: Nurse Practitioner

## 2021-08-23 DIAGNOSIS — E119 Type 2 diabetes mellitus without complications: Secondary | ICD-10-CM

## 2021-09-03 ENCOUNTER — Ambulatory Visit: Payer: 59 | Admitting: Specialist

## 2021-09-04 NOTE — H&P (View-Only) (Signed)
Mario Arnold Date of Birth: September 15, 1955 Medical Record #[id number]  History of Present Illness: Mario Arnold is seen for follow up CAD.  Has known CAD with  remote PCI of the LAD in 2003. Last cath in 2013 following abnormal Myoview showed moderate 2 vessel disease with a 50% in stent stenosis of the proximal LAD and a 70% lesion in an intermediate branch. EF of 50% - managed medically. Other issues include  thyroid nodule with normal TSH, prior TIA in 2013, DM, HTN and HLD.   He was seen in June 2018 with symptoms of increased dyspnea on exertion. Myoview was repeated and felt to be low risk. Last seen in Feb 2019. Carotid dopplers in June 2020 showed 40-59% right ICA stenosis. Followed by Dr Trula Slade. States he had Covid 19 back in October.   On follow up today he is complains of a lack of energy. He does note SOB and some mild tightness in his chest when he walks up hill. He has a lot of leg weakness. Chronic back pain. This limits his walking. Has seen Dr Louanne Skye and extensive lumbar surgery is recommended. Last year he ran out of his meds and his BP, lipids and sugar all went way up. Now back on meds. Lipids have not been reassessed. Sugar initially much better on Trulicity but unable to afford. Actos increased last month. Has gained 3 lbs.    Current Outpatient Medications  Medication Sig Dispense Refill   acetaminophen (TYLENOL) 500 MG tablet Take 1,000 mg by mouth as needed for mild pain.      aspirin EC 81 MG tablet Take 1 tablet (81 mg total) by mouth daily. Swallow whole. 90 tablet 3   atorvastatin (LIPITOR) 80 MG tablet Take 1 tablet (80 mg total) by mouth daily. 90 tablet 1   blood glucose meter kit and supplies Dispense based on patient and insurance preference. Use up to four times daily as directed. (FOR ICD-10 E10.9, E11.9). 1 each 0   diclofenac (CATAFLAM) 50 MG tablet Take 1 tablet (50 mg total) by mouth 3 (three) times daily. 90 tablet 2   ezetimibe (ZETIA) 10 MG tablet Take 1 tablet (10  mg total) by mouth every evening. 90 tablet 1   fenofibrate 160 MG tablet Take 1 tablet (160 mg total) by mouth daily. 90 tablet 1   glyBURIDE (DIABETA) 5 MG tablet TAKE 2 TABLETS BY MOUTH TWICE DAILY WITH A MEAL 180 tablet 2   HYDROcodone-acetaminophen (NORCO/VICODIN) 5-325 MG tablet Take 1 tablet by mouth every 6 (six) hours as needed for moderate pain. 30 tablet 0   isosorbide mononitrate (IMDUR) 60 MG 24 hr tablet Take 1 tablet by mouth once daily 90 tablet 0   losartan (COZAAR) 25 MG tablet Take 1 tablet by mouth once daily 90 tablet 0   metFORMIN (GLUCOPHAGE) 500 MG tablet TAKE 2 TABLETS BY MOUTH IN THE MORNING AND 2 IN THE EVENING 360 tablet 0   metoprolol tartrate (LOPRESSOR) 25 MG tablet Take 1 tablet (25 mg total) by mouth 2 (two) times daily. 180 tablet 1   nitroGLYCERIN (NITROSTAT) 0.4 MG SL tablet Place 1 tablet (0.4 mg total) under the tongue every 5 (five) minutes as needed. 25 tablet 6   omega-3 acid ethyl esters (LOVAZA) 1 g capsule Take 2 capsules (2 g total) by mouth 2 (two) times daily. 120 capsule 3   pioglitazone (ACTOS) 45 MG tablet Take 1 tablet (45 mg total) by mouth daily. 30 tablet 3  No current facility-administered medications for this visit.    Allergies  Allergen Reactions   Hydromorphone Nausea And Vomiting    Past Medical History:  Diagnosis Date   Arthritis    Carotid disease, bilateral ([place]) June 2013   less than 50% stenosis in the right and left    Chronic kidney disease 2017   stones   Coronary artery disease    STATUS POST ANGIOPLASTY OF THE LAD    Diabetes mellitus    TYPE 2   Dyslipidemia    History of transient ischemic attack (TIA)    on aggrenox   Hyperlipidemia    Myocardial infarction [facility]) 1999   stent placement- per patient has 2 stents   Stroke [facility])    Thyroid nodule June 2013   had [place] with 4 mm small cystic nodule noted. Needs follow up US in 12 months. TSH is normal.     Past Surgical History:  Procedure Laterality Date    COLONOSCOPY     13 years ago in [facility] normal exam per pt   CORONARY ANGIOPLASTY WITH STENT PLACEMENT  03/28/2001   LAD. EJECTION FRACTION IS 45%   SHOULDER ARTHROSCOPY WITH OPEN ROTATOR CUFF REPAIR AND DISTAL CLAVICLE ACROMINECTOMY Right 09/05/2012   Procedure: RIGHT SHOULDER ARTHROSCOPY WITH LABRAL DEBRIDEMENT AND OPEN DISTAL CLAVICLE RESECTION, acromionectomy  AND OPEN ROTATOR CUFF REPAIR;  Surgeon: Magnus Sinning, MD;  Location: [facility];  Service: Orthopedics;  Laterality: Right;    Social History   Tobacco Use  Smoking Status Former   Packs/day: 1.50   Years: 23.00   Total pack years: 34.50   Types: Cigarettes   Quit date: 03/28/1997   Years since quitting: 24.4  Smokeless Tobacco Former   Types: Snuff, Chew  Tobacco Comments   as a teen    Social History   Substance and Sexual Activity  Alcohol Use Yes   Alcohol/week: 2.0 standard drinks of alcohol   Types: 2 Cans of beer per week   Comment: socially    Family History  Problem Relation Age of Onset   Coronary artery disease Mother    Heart attack Mother    Diabetes Mother    Transient ischemic attack Mother    Heart disease Mother    Cancer Father    Colon cancer Neg Hx    Esophageal cancer Neg Hx    Colon polyps Neg Hx    Rectal cancer Neg Hx    Stomach cancer Neg Hx     Review of Systems: The review of systems is per the HPI.  All other systems were reviewed and are negative.  Physical Exam: BP 100/70 (BP Location: Left Arm, Patient Position: Sitting, Cuff Size: Normal)   Pulse (!) 52   Ht $R'5\' 7"'oQ$  (1.702 m)   Wt 182 lb (82.6 kg)   SpO2 96%   BMI 28.51 kg/m  GENERAL:  Well appearing WM in NAD HEENT:  PERRL, EOMI, sclera are clear. Oropharynx is clear. NECK:  No jugular venous distention, carotid upstroke brisk and symmetric, no bruits, no thyromegaly or adenopathy LUNGS:  Clear to auscultation bilaterally CHEST:  Unremarkable HEART:  RRR,  PMI not displaced or sustained,S1 and S2 within  normal limits, no S3, no S4: no clicks, no rubs, no murmurs ABD:  Soft, nontender. BS +, no masses or bruits. No hepatomegaly, no splenomegaly EXT:  2 + pulses throughout, no edema, no cyanosis no clubbing SKIN:  Warm and dry.  No rashes NEURO:  Alert and  oriented x 3. Cranial nerves II through XII intact. PSYCH:  Cognitively intact    LABORATORY DATA:    Lab Results  Component Value Date   WBC 4.8 09/29/2020   HGB 16.3 09/29/2020   HCT 48.9 09/29/2020   PLT 177 09/29/2020   GLUCOSE 273 (H) 09/29/2020   CHOL 282 (H) 09/29/2020   TRIG 743 (HH) 09/29/2020   HDL 28 (L) 09/29/2020   LDLDIRECT 55.5 05/07/2013   LDLCALC 118 (H) 09/29/2020   ALT 40 09/29/2020   AST 35 09/29/2020   NA 133 (L) 09/29/2020   K 4.4 09/29/2020   CL 101 09/29/2020   CREATININE 1.01 09/29/2020   BUN 16 09/29/2020   CO2 20 09/29/2020   TSH 2.520 09/29/2020   INR 0.9 12/04/2019   HGBA1C 9.5 (H) 08/10/2021   MICROALBUR 30 08/10/2021   Carotid dopplers June 2020: 40-59% RICA stenosis.   Labs reviewed from primary care on 12/04/15: cholesterol 139, triglycerides 628, LDL 40, HDL 24. A1c 12. Glucose 250. BUN 19, creatinine 1.23. Other chemistries and CBC normal.  Dated 09/23/16: glucose 237, creatinine 1.3. A1c 10.4. Cholesterol 110, triglycerides 124. HDL 27, LDL 59.  Dated 04/21/17: A1c 7.5. Creatinine 1.39. Other chemistries normal. Dated 05/29/19: cholesterol 133, triglycerides 190, HDL 29, LDL 72. A1c 7.7%. creatinine 1.31. glucose 500. Bicarb 18. Other chemistries normal. CBC and TSH normal.  Dated 08/29/19: A1c 9%.   Ecg today shows NSR rate 65 old septal infarct. I have personally reviewed and interpreted this study.   Myoview 09/30/16: Study Highlights   Clincally negative, electrically positive for ischemia Excellent exercise tolerance Basal inferior, inferolateral defect that normalizes in recovery period. May reflect soft tissue attenuation, cannot exclude very mild ischemia LVEF 50% Low to  intermediate risk study    Assessment / Plan: 1. CAD - remote PCI of the LAD in 2003 - known 2 vessel CAD with 50% ISR of the LAD and 70% intermediate lesion in 2013 - managed medically. Low risk Myoview in July 2018. He has stable class 2 angina. Recommend follow up lexiscan myoview for assessment and for assessment prior to back surgery. If unchanged can move forward. If myoview is abnormal will need to consider repeat  cardiac cath.  Continue with CV risk factor modification.   2. HTN - BP well controlled.   3. HLD - poorly controlled last year since has been out of medication. Also reflects poor glycemic control. Lipitor, Zetia, fish oil, and fenofibrate all renewed by PCP. Needs repeat lipid panel on meds.   4. DM type 2. Last A1c 9.5%. Did much better on Trulicity but unable to afford. Also unable to afford Ozempic. Actos dose increased last month. Follow up with PCP  5. Lumbar disc disease with radiculopathy. I would like to check LE arterial dopplers to make sure his circulation is ok. High risk for PAD.     Follow up in 6 months.

## 2021-09-04 NOTE — Progress Notes (Unsigned)
Mario Arnold Date of Birth: November 06, 1955 Medical Record #[id number]  History of Present Illness: Mario Arnold is seen for follow up CAD.  Has known CAD with  remote PCI of the LAD in 2003. Last cath in 2013 following abnormal Myoview showed moderate 2 vessel disease with a 50% in stent stenosis of the proximal LAD and a 70% lesion in an intermediate branch. EF of 50% - managed medically. Other issues include  thyroid nodule with normal TSH, prior TIA in 2013, DM, HTN and HLD.   He was seen in June 2018 with symptoms of increased dyspnea on exertion. Myoview was repeated and felt to be low risk. Last seen in Feb 2019. Carotid dopplers in June 2020 showed 40-59% right ICA stenosis. Followed by Dr Trula Slade. States he had Covid 19 back in October.   On follow up today he is complains of a lack of energy. He does note SOB and some mild tightness in his chest when he walks up hill. He has a lot of leg weakness. Chronic back pain. This limits his walking. Has seen Dr Louanne Skye and extensive lumbar surgery is recommended. Last year he ran out of his meds and his BP, lipids and sugar all went way up. Now back on meds. Lipids have not been reassessed. Sugar initially much better on Trulicity but unable to afford. Actos increased last month. Has gained 3 lbs.    Current Outpatient Medications  Medication Sig Dispense Refill   acetaminophen (TYLENOL) 500 MG tablet Take 1,000 mg by mouth as needed for mild pain.      aspirin EC 81 MG tablet Take 1 tablet (81 mg total) by mouth daily. Swallow whole. 90 tablet 3   atorvastatin (LIPITOR) 80 MG tablet Take 1 tablet (80 mg total) by mouth daily. 90 tablet 1   blood glucose meter kit and supplies Dispense based on patient and insurance preference. Use up to four times daily as directed. (FOR ICD-10 E10.9, E11.9). 1 each 0   diclofenac (CATAFLAM) 50 MG tablet Take 1 tablet (50 mg total) by mouth 3 (three) times daily. 90 tablet 2   ezetimibe (ZETIA) 10 MG tablet Take 1 tablet (10  mg total) by mouth every evening. 90 tablet 1   fenofibrate 160 MG tablet Take 1 tablet (160 mg total) by mouth daily. 90 tablet 1   glyBURIDE (DIABETA) 5 MG tablet TAKE 2 TABLETS BY MOUTH TWICE DAILY WITH A MEAL 180 tablet 2   HYDROcodone-acetaminophen (NORCO/VICODIN) 5-325 MG tablet Take 1 tablet by mouth every 6 (six) hours as needed for moderate pain. 30 tablet 0   isosorbide mononitrate (IMDUR) 60 MG 24 hr tablet Take 1 tablet by mouth once daily 90 tablet 0   losartan (COZAAR) 25 MG tablet Take 1 tablet by mouth once daily 90 tablet 0   metFORMIN (GLUCOPHAGE) 500 MG tablet TAKE 2 TABLETS BY MOUTH IN THE MORNING AND 2 IN THE EVENING 360 tablet 0   metoprolol tartrate (LOPRESSOR) 25 MG tablet Take 1 tablet (25 mg total) by mouth 2 (two) times daily. 180 tablet 1   nitroGLYCERIN (NITROSTAT) 0.4 MG SL tablet Place 1 tablet (0.4 mg total) under the tongue every 5 (five) minutes as needed. 25 tablet 6   omega-3 acid ethyl esters (LOVAZA) 1 g capsule Take 2 capsules (2 g total) by mouth 2 (two) times daily. 120 capsule 3   pioglitazone (ACTOS) 45 MG tablet Take 1 tablet (45 mg total) by mouth daily. 30 tablet 3  No current facility-administered medications for this visit.    Allergies  Allergen Reactions   Hydromorphone Nausea And Vomiting    Past Medical History:  Diagnosis Date   Arthritis    Carotid disease, bilateral ([place]) June 2013   less than 50% stenosis in the right and left    Chronic kidney disease 2017   stones   Coronary artery disease    STATUS POST ANGIOPLASTY OF THE LAD    Diabetes mellitus    TYPE 2   Dyslipidemia    History of transient ischemic attack (TIA)    on aggrenox   Hyperlipidemia    Myocardial infarction [facility]) 1999   stent placement- per patient has 2 stents   Stroke [facility])    Thyroid nodule June 2013   had [place] with 4 mm small cystic nodule noted. Needs follow up US in 12 months. TSH is normal.     Past Surgical History:  Procedure Laterality Date    COLONOSCOPY     13 years ago in [facility] normal exam per pt   CORONARY ANGIOPLASTY WITH STENT PLACEMENT  03/28/2001   LAD. EJECTION FRACTION IS 45%   SHOULDER ARTHROSCOPY WITH OPEN ROTATOR CUFF REPAIR AND DISTAL CLAVICLE ACROMINECTOMY Right 09/05/2012   Procedure: RIGHT SHOULDER ARTHROSCOPY WITH LABRAL DEBRIDEMENT AND OPEN DISTAL CLAVICLE RESECTION, acromionectomy  AND OPEN ROTATOR CUFF REPAIR;  Surgeon: Magnus Sinning, MD;  Location: [facility];  Service: Orthopedics;  Laterality: Right;    Social History   Tobacco Use  Smoking Status Former   Packs/day: 1.50   Years: 23.00   Total pack years: 34.50   Types: Cigarettes   Quit date: 03/28/1997   Years since quitting: 24.4  Smokeless Tobacco Former   Types: Snuff, Chew  Tobacco Comments   as a teen    Social History   Substance and Sexual Activity  Alcohol Use Yes   Alcohol/week: 2.0 standard drinks of alcohol   Types: 2 Cans of beer per week   Comment: socially    Family History  Problem Relation Age of Onset   Coronary artery disease Mother    Heart attack Mother    Diabetes Mother    Transient ischemic attack Mother    Heart disease Mother    Cancer Father    Colon cancer Neg Hx    Esophageal cancer Neg Hx    Colon polyps Neg Hx    Rectal cancer Neg Hx    Stomach cancer Neg Hx     Review of Systems: The review of systems is per the HPI.  All other systems were reviewed and are negative.  Physical Exam: BP 100/70 (BP Location: Left Arm, Patient Position: Sitting, Cuff Size: Normal)   Pulse (!) 52   Ht $R'5\' 7"'UG$  (1.702 m)   Wt 182 lb (82.6 kg)   SpO2 96%   BMI 28.51 kg/m  GENERAL:  Well appearing WM in NAD HEENT:  PERRL, EOMI, sclera are clear. Oropharynx is clear. NECK:  No jugular venous distention, carotid upstroke brisk and symmetric, no bruits, no thyromegaly or adenopathy LUNGS:  Clear to auscultation bilaterally CHEST:  Unremarkable HEART:  RRR,  PMI not displaced or sustained,S1 and S2 within  normal limits, no S3, no S4: no clicks, no rubs, no murmurs ABD:  Soft, nontender. BS +, no masses or bruits. No hepatomegaly, no splenomegaly EXT:  2 + pulses throughout, no edema, no cyanosis no clubbing SKIN:  Warm and dry.  No rashes NEURO:  Alert and  oriented x 3. Cranial nerves II through XII intact. PSYCH:  Cognitively intact    LABORATORY DATA:    Lab Results  Component Value Date   WBC 4.8 09/29/2020   HGB 16.3 09/29/2020   HCT 48.9 09/29/2020   PLT 177 09/29/2020   GLUCOSE 273 (H) 09/29/2020   CHOL 282 (H) 09/29/2020   TRIG 743 (HH) 09/29/2020   HDL 28 (L) 09/29/2020   LDLDIRECT 55.5 05/07/2013   LDLCALC 118 (H) 09/29/2020   ALT 40 09/29/2020   AST 35 09/29/2020   NA 133 (L) 09/29/2020   K 4.4 09/29/2020   CL 101 09/29/2020   CREATININE 1.01 09/29/2020   BUN 16 09/29/2020   CO2 20 09/29/2020   TSH 2.520 09/29/2020   INR 0.9 12/04/2019   HGBA1C 9.5 (H) 08/10/2021   MICROALBUR 30 08/10/2021   Carotid dopplers June 2020: 40-59% RICA stenosis.   Labs reviewed from primary care on 12/04/15: cholesterol 139, triglycerides 628, LDL 40, HDL 24. A1c 12. Glucose 250. BUN 19, creatinine 1.23. Other chemistries and CBC normal.  Dated 09/23/16: glucose 237, creatinine 1.3. A1c 10.4. Cholesterol 110, triglycerides 124. HDL 27, LDL 59.  Dated 04/21/17: A1c 7.5. Creatinine 1.39. Other chemistries normal. Dated 05/29/19: cholesterol 133, triglycerides 190, HDL 29, LDL 72. A1c 7.7%. creatinine 1.31. glucose 500. Bicarb 18. Other chemistries normal. CBC and TSH normal.  Dated 08/29/19: A1c 9%.   Ecg today shows NSR rate 98 old septal infarct. I have personally reviewed and interpreted this study.   Myoview 09/30/16: Study Highlights   Clincally negative, electrically positive for ischemia Excellent exercise tolerance Basal inferior, inferolateral defect that normalizes in recovery period. May reflect soft tissue attenuation, cannot exclude very mild ischemia LVEF 50% Low to  intermediate risk study    Assessment / Plan: 1. CAD - remote PCI of the LAD in 2003 - known 2 vessel CAD with 50% ISR of the LAD and 70% intermediate lesion in 2013 - managed medically. Low risk Myoview in July 2018. He has stable class 2 angina. Recommend follow up lexiscan myoview for assessment and for assessment prior to back surgery. If unchanged can move forward. If myoview is abnormal will need to consider repeat  cardiac cath.  Continue with CV risk factor modification.   2. HTN - BP well controlled.   3. HLD - poorly controlled last year since has been out of medication. Also reflects poor glycemic control. Lipitor, Zetia, fish oil, and fenofibrate all renewed by PCP. Needs repeat lipid panel on meds.   4. DM type 2. Last A1c 9.5%. Did much better on Trulicity but unable to afford. Also unable to afford Ozempic. Actos dose increased last month. Follow up with PCP  5. Lumbar disc disease with radiculopathy. I would like to check LE arterial dopplers to make sure his circulation is ok. High risk for PAD.     Follow up in 6 months.

## 2021-09-08 ENCOUNTER — Ambulatory Visit (INDEPENDENT_AMBULATORY_CARE_PROVIDER_SITE_OTHER): Payer: 59 | Admitting: Cardiology

## 2021-09-08 ENCOUNTER — Encounter: Payer: Self-pay | Admitting: Cardiology

## 2021-09-08 ENCOUNTER — Other Ambulatory Visit (HOSPITAL_COMMUNITY): Payer: Self-pay | Admitting: Cardiology

## 2021-09-08 VITALS — BP 100/70 | HR 52 | Ht 67.0 in | Wt 182.0 lb

## 2021-09-08 DIAGNOSIS — I25118 Atherosclerotic heart disease of native coronary artery with other forms of angina pectoris: Secondary | ICD-10-CM | POA: Diagnosis not present

## 2021-09-08 DIAGNOSIS — I10 Essential (primary) hypertension: Secondary | ICD-10-CM

## 2021-09-08 DIAGNOSIS — E785 Hyperlipidemia, unspecified: Secondary | ICD-10-CM | POA: Diagnosis not present

## 2021-09-08 DIAGNOSIS — I739 Peripheral vascular disease, unspecified: Secondary | ICD-10-CM

## 2021-09-08 DIAGNOSIS — E119 Type 2 diabetes mellitus without complications: Secondary | ICD-10-CM | POA: Diagnosis not present

## 2021-09-08 DIAGNOSIS — M79605 Pain in left leg: Secondary | ICD-10-CM

## 2021-09-08 DIAGNOSIS — M79604 Pain in right leg: Secondary | ICD-10-CM

## 2021-09-08 DIAGNOSIS — R29898 Other symptoms and signs involving the musculoskeletal system: Secondary | ICD-10-CM

## 2021-09-08 NOTE — Patient Instructions (Signed)
Medication Instructions:   No changes  *If you need a refill on your cardiac medications before your next appointment, please call your pharmacy*   Lab Work:  Lipids - fasting  can do at primary office if you like ,please call to confirm they will do other doctor's lab  If you have labs (blood work) drawn today and your tests are completely normal, you will receive your results only by: MyChart Message (if you have MyChart) OR A paper copy in the mail If you have any lab test that is abnormal or we need to change your treatment, we will call you to review the results.   Testing/Procedures: Will be schedule at  [address] Your physician has requested that you have a lexiscan myoview. Please follow instruction sheet, as given.    And  Your physician has requested that you have a lower or upper extremity arterial duplex. This test is an ultrasound of the arteries in the legs or arms. It looks at arterial blood flow in the legs. Allow one hour for Lower Arterial scans. There are no restrictions or special instructions   Follow-Up: At [facility], you and your health needs are our priority.  As part of our continuing mission to provide you with exceptional heart care, we have created designated Provider Care Teams.  These Care Teams include your primary Cardiologist (physician) and Advanced Practice Providers (APPs -  Physician Assistants and Nurse Practitioners) who all work together to provide you with the care you need, when you need it.     Your next appointment:   6 month(s) if studies are good  The format for your next appointment:   In Person  Provider:   Peter Swaziland, MD    Other Instructions

## 2021-09-09 ENCOUNTER — Telehealth (HOSPITAL_COMMUNITY): Payer: Self-pay | Admitting: *Deleted

## 2021-09-09 NOTE — Telephone Encounter (Signed)
Close encounter 

## 2021-09-10 ENCOUNTER — Ambulatory Visit (HOSPITAL_COMMUNITY)
Admission: RE | Admit: 2021-09-10 | Discharge: 2021-09-10 | Disposition: A | Discharge status: Home / Self Care | Payer: 59 | Source: Ambulatory Visit | Attending: Cardiology | Admitting: Cardiology

## 2021-09-10 ENCOUNTER — Ambulatory Visit (HOSPITAL_BASED_OUTPATIENT_CLINIC_OR_DEPARTMENT_OTHER)
Admission: RE | Admit: 2021-09-10 | Discharge: 2021-09-10 | Disposition: A | Discharge status: Home / Self Care | Payer: 59 | Source: Ambulatory Visit | Attending: Cardiology | Admitting: Cardiology

## 2021-09-10 DIAGNOSIS — E119 Type 2 diabetes mellitus without complications: Secondary | ICD-10-CM | POA: Diagnosis present

## 2021-09-10 DIAGNOSIS — E785 Hyperlipidemia, unspecified: Secondary | ICD-10-CM | POA: Insufficient documentation

## 2021-09-10 DIAGNOSIS — I10 Essential (primary) hypertension: Secondary | ICD-10-CM | POA: Diagnosis present

## 2021-09-10 DIAGNOSIS — I25118 Atherosclerotic heart disease of native coronary artery with other forms of angina pectoris: Secondary | ICD-10-CM | POA: Diagnosis present

## 2021-09-10 DIAGNOSIS — I739 Peripheral vascular disease, unspecified: Secondary | ICD-10-CM | POA: Insufficient documentation

## 2021-09-10 LAB — MYOCARDIAL PERFUSION IMAGING
LV dias vol: 121 mL (ref 62–150)
LV sys vol: 61 mL
Nuc Stress EF: 50 %
Peak HR: 82 {beats}/min
Rest HR: 58 {beats}/min
Rest Nuclear Isotope Dose: 10.7 mCi
SDS: 5
SRS: 1
SSS: 6
ST Depression (mm): 0 mm
Stress Nuclear Isotope Dose: 31.3 mCi
TID: 1.11

## 2021-09-10 MED ORDER — TECHNETIUM TC 99M TETROFOSMIN IV KIT
10.7000 | PACK | Freq: Once | INTRAVENOUS | Status: AC | PRN
  Administered 2021-09-10: 10.7 via INTRAVENOUS

## 2021-09-10 MED ORDER — REGADENOSON 0.4 MG/5ML IV SOLN
0.4000 mg | Freq: Once | INTRAVENOUS | Status: AC
  Administered 2021-09-10: 0.4 mg via INTRAVENOUS

## 2021-09-10 MED ORDER — TECHNETIUM TC 99M TETROFOSMIN IV KIT
31.3000 | PACK | Freq: Once | INTRAVENOUS | Status: AC | PRN
  Administered 2021-09-10: 31.3 via INTRAVENOUS

## 2021-09-15 ENCOUNTER — Encounter: Payer: Self-pay | Admitting: *Deleted

## 2021-09-15 ENCOUNTER — Telehealth: Payer: Self-pay | Admitting: *Deleted

## 2021-09-15 DIAGNOSIS — I25118 Atherosclerotic heart disease of native coronary artery with other forms of angina pectoris: Secondary | ICD-10-CM

## 2021-09-15 DIAGNOSIS — R9439 Abnormal result of other cardiovascular function study: Secondary | ICD-10-CM

## 2021-09-15 NOTE — Telephone Encounter (Signed)
pt aware of results  Cath scheduled for Friday 09/24/21 @ 10:30 am The patient is currently driving and will be back in town by Thursday next week and will come by the office for lab work. Instructions discussed with the patient on the phone and sent to his my chart

## 2021-09-15 NOTE — Telephone Encounter (Signed)
-----   Message from Peter M Swaziland, MD sent at 09/15/2021  1:52 PM EDT ----- This study demonstrates:  Myoview is abnormal. Compared to 2018 there is a new anteroapical defect. EF is stable at 50%.  Medication changes / Follow up studies / Other recommendations:   I would recommend he have cardiac cath given results and recent symptoms. He also needs clearance for pretty extensive back surgery. Please arrange cath with me.  Please send results to the PCP:  Carlean Jews, NP  Peter Swaziland, MD 09/15/2021 1:50 PM

## 2021-09-17 ENCOUNTER — Ambulatory Visit (INDEPENDENT_AMBULATORY_CARE_PROVIDER_SITE_OTHER): Payer: 59 | Admitting: Specialist

## 2021-09-17 ENCOUNTER — Encounter: Payer: Self-pay | Admitting: Specialist

## 2021-09-17 VITALS — BP 122/73 | HR 60 | Ht 67.0 in | Wt 181.0 lb

## 2021-09-17 DIAGNOSIS — E1142 Type 2 diabetes mellitus with diabetic polyneuropathy: Secondary | ICD-10-CM | POA: Diagnosis not present

## 2021-09-17 DIAGNOSIS — M545 Low back pain, unspecified: Secondary | ICD-10-CM | POA: Diagnosis not present

## 2021-09-17 DIAGNOSIS — R29898 Other symptoms and signs involving the musculoskeletal system: Secondary | ICD-10-CM

## 2021-09-17 DIAGNOSIS — M21372 Foot drop, left foot: Secondary | ICD-10-CM

## 2021-09-17 DIAGNOSIS — M5116 Intervertebral disc disorders with radiculopathy, lumbar region: Secondary | ICD-10-CM

## 2021-09-17 DIAGNOSIS — M48062 Spinal stenosis, lumbar region with neurogenic claudication: Secondary | ICD-10-CM

## 2021-09-17 DIAGNOSIS — Z794 Long term (current) use of insulin: Secondary | ICD-10-CM

## 2021-09-17 DIAGNOSIS — G8929 Other chronic pain: Secondary | ICD-10-CM

## 2021-09-23 ENCOUNTER — Telehealth: Payer: Self-pay | Admitting: *Deleted

## 2021-09-23 LAB — CBC
Hematocrit: 43.4 % (ref 37.5–51.0)
Hemoglobin: 14.7 g/dL (ref 13.0–17.7)
MCH: 29.6 pg (ref 26.6–33.0)
MCHC: 33.9 g/dL (ref 31.5–35.7)
MCV: 87 fL (ref 79–97)
Platelets: 159 10*3/uL (ref 150–450)
RBC: 4.97 x10E6/uL (ref 4.14–5.80)
RDW: 12.3 % (ref 11.6–15.4)
WBC: 4.1 10*3/uL (ref 3.4–10.8)

## 2021-09-23 LAB — BASIC METABOLIC PANEL
BUN/Creatinine Ratio: 17 (ref 10–24)
BUN: 21 mg/dL (ref 8–27)
CO2: 22 mmol/L (ref 20–29)
Calcium: 10.1 mg/dL (ref 8.6–10.2)
Chloride: 105 mmol/L (ref 96–106)
Creatinine, Ser: 1.21 mg/dL (ref 0.76–1.27)
Glucose: 181 mg/dL — ABNORMAL HIGH (ref 70–99)
Potassium: 5.3 mmol/L — ABNORMAL HIGH (ref 3.5–5.2)
Sodium: 137 mmol/L (ref 134–144)
eGFR: 67 mL/min/{1.73_m2} (ref >59)

## 2021-09-23 LAB — LIPID PANEL
Chol/HDL Ratio: 3.9 ratio (ref 0.0–5.0)
Cholesterol, Total: 117 mg/dL (ref 100–199)
HDL: 30 mg/dL — ABNORMAL LOW (ref >39)
LDL Chol Calc (NIH): 67 mg/dL (ref 0–99)
Triglycerides: 107 mg/dL (ref 0–149)
VLDL Cholesterol Cal: 20 mg/dL (ref 5–40)

## 2021-09-23 NOTE — Telephone Encounter (Signed)
Cardiac Catheterization scheduled at [facility] for: Friday September 24, 2021 10:30 AM Arrival time and place: [facility] Main Entrance A at: 8:30 AM   Nothing to eat after midnight prior to procedure, clear liquids until 5 AM day of procedure.  Medication instructions: -Hold:  Glyburide/Actos-AM of procedure   Metformin-day of procedure and 48 hours post procedure -Except hold medications usual morning medications can be taken with sips of water including aspirin 81 mg.  Confirmed patient has responsible adult to drive home post procedure and be with patient first 24 hours after arriving home.  Patient reports no new symptoms concerning for COVID-19 in the past 10 days.  Reviewed procedure instructions with patient.

## 2021-09-24 ENCOUNTER — Other Ambulatory Visit: Payer: Self-pay

## 2021-09-24 ENCOUNTER — Ambulatory Visit (HOSPITAL_COMMUNITY)
Admission: RE | Admit: 2021-09-24 | Discharge: 2021-09-24 | Disposition: A | Discharge status: Home / Self Care | Payer: 59 | Attending: Cardiology | Admitting: Cardiology

## 2021-09-24 ENCOUNTER — Telehealth: Payer: Self-pay | Admitting: Cardiology

## 2021-09-24 ENCOUNTER — Encounter (HOSPITAL_COMMUNITY)
Admission: RE | Disposition: A | Discharge status: Home / Self Care | Payer: Self-pay | Source: Home / Self Care | Attending: Cardiology

## 2021-09-24 ENCOUNTER — Ambulatory Visit (HOSPITAL_BASED_OUTPATIENT_CLINIC_OR_DEPARTMENT_OTHER): Payer: 59

## 2021-09-24 DIAGNOSIS — Z955 Presence of coronary angioplasty implant and graft: Secondary | ICD-10-CM | POA: Insufficient documentation

## 2021-09-24 DIAGNOSIS — I129 Hypertensive chronic kidney disease with stage 1 through stage 4 chronic kidney disease, or unspecified chronic kidney disease: Secondary | ICD-10-CM | POA: Diagnosis not present

## 2021-09-24 DIAGNOSIS — I2510 Atherosclerotic heart disease of native coronary artery without angina pectoris: Secondary | ICD-10-CM

## 2021-09-24 DIAGNOSIS — E1122 Type 2 diabetes mellitus with diabetic chronic kidney disease: Secondary | ICD-10-CM | POA: Diagnosis not present

## 2021-09-24 DIAGNOSIS — E785 Hyperlipidemia, unspecified: Secondary | ICD-10-CM | POA: Insufficient documentation

## 2021-09-24 DIAGNOSIS — I209 Angina pectoris, unspecified: Secondary | ICD-10-CM

## 2021-09-24 DIAGNOSIS — Z79899 Other long term (current) drug therapy: Secondary | ICD-10-CM | POA: Insufficient documentation

## 2021-09-24 DIAGNOSIS — N189 Chronic kidney disease, unspecified: Secondary | ICD-10-CM | POA: Insufficient documentation

## 2021-09-24 DIAGNOSIS — Z87891 Personal history of nicotine dependence: Secondary | ICD-10-CM | POA: Insufficient documentation

## 2021-09-24 DIAGNOSIS — Z8673 Personal history of transient ischemic attack (TIA), and cerebral infarction without residual deficits: Secondary | ICD-10-CM | POA: Insufficient documentation

## 2021-09-24 DIAGNOSIS — M5116 Intervertebral disc disorders with radiculopathy, lumbar region: Secondary | ICD-10-CM | POA: Diagnosis not present

## 2021-09-24 DIAGNOSIS — Z7984 Long term (current) use of oral hypoglycemic drugs: Secondary | ICD-10-CM | POA: Insufficient documentation

## 2021-09-24 DIAGNOSIS — I25119 Atherosclerotic heart disease of native coronary artery with unspecified angina pectoris: Secondary | ICD-10-CM | POA: Diagnosis present

## 2021-09-24 DIAGNOSIS — E119 Type 2 diabetes mellitus without complications: Secondary | ICD-10-CM

## 2021-09-24 HISTORY — PX: LEFT HEART CATH AND CORONARY ANGIOGRAPHY: CATH118249

## 2021-09-24 HISTORY — PX: INTRAVASCULAR PRESSURE WIRE/FFR STUDY: CATH118243

## 2021-09-24 LAB — GLUCOSE, CAPILLARY
Glucose-Capillary: 212 mg/dL — ABNORMAL HIGH (ref 70–99)
Glucose-Capillary: 203 mg/dL — ABNORMAL HIGH (ref 70–99)

## 2021-09-24 LAB — ECHOCARDIOGRAM COMPLETE
Area-P 1/2: 3.53 cm2
Calc EF: 54.3 %
Height: 67 in
S' Lateral: 3.8 cm
Single Plane A2C EF: 52.5 %
Single Plane A4C EF: 50.4 %
Weight: 2880 oz

## 2021-09-24 LAB — POCT ACTIVATED CLOTTING TIME: Activated Clotting Time: 305 seconds

## 2021-09-24 SURGERY — LEFT HEART CATH AND CORONARY ANGIOGRAPHY
Anesthesia: LOCAL

## 2021-09-24 MED ORDER — SODIUM CHLORIDE 0.9% FLUSH: 3.0000 mL | INTRAVENOUS | Status: DC | PRN

## 2021-09-24 MED ORDER — FENTANYL CITRATE (PF) 100 MCG/2ML IJ SOLN
INTRAMUSCULAR | Status: AC
  Filled 2021-09-24: qty 2

## 2021-09-24 MED ORDER — NITROGLYCERIN 1 MG/10 ML FOR IR/CATH LAB
INTRA_ARTERIAL | Status: DC | PRN
  Administered 2021-09-24: 200 ug via INTRACORONARY

## 2021-09-24 MED ORDER — MIDAZOLAM HCL 2 MG/2ML IJ SOLN
INTRAMUSCULAR | Status: AC
  Filled 2021-09-24: qty 2

## 2021-09-24 MED ORDER — HEPARIN (PORCINE) IN NACL 1000-0.9 UT/500ML-% IV SOLN
INTRAVENOUS | Status: DC | PRN
  Administered 2021-09-24 (×2): 500 mL

## 2021-09-24 MED ORDER — SODIUM CHLORIDE 0.9% FLUSH: 3.0000 mL | Freq: Two times a day (BID) | INTRAVENOUS | Status: DC

## 2021-09-24 MED ORDER — LIDOCAINE HCL (PF) 1 % IJ SOLN
INTRAMUSCULAR | Status: DC | PRN
  Administered 2021-09-24: 2 mL via INTRADERMAL

## 2021-09-24 MED ORDER — HEPARIN SODIUM (PORCINE) 1000 UNIT/ML IJ SOLN
INTRAMUSCULAR | Status: AC
  Filled 2021-09-24: qty 10

## 2021-09-24 MED ORDER — HEPARIN (PORCINE) IN NACL 1000-0.9 UT/500ML-% IV SOLN
INTRAVENOUS | Status: AC
  Filled 2021-09-24: qty 1000

## 2021-09-24 MED ORDER — ASPIRIN 81 MG PO CHEW: 81.0000 mg | CHEWABLE_TABLET | ORAL | Status: DC

## 2021-09-24 MED ORDER — NITROGLYCERIN 1 MG/10 ML FOR IR/CATH LAB
INTRA_ARTERIAL | Status: AC
  Filled 2021-09-24: qty 10

## 2021-09-24 MED ORDER — SODIUM CHLORIDE 0.9 % WEIGHT BASED INFUSION: 1.0000 mL/kg/h | INTRAVENOUS | Status: DC

## 2021-09-24 MED ORDER — IOHEXOL 350 MG/ML SOLN
INTRAVENOUS | Status: DC | PRN
  Administered 2021-09-24: 100 mL

## 2021-09-24 MED ORDER — SODIUM CHLORIDE 0.9 % IV SOLN
250.0000 mL | INTRAVENOUS | Status: DC | PRN
Start: 2021-09-24 — End: 2021-09-24

## 2021-09-24 MED ORDER — LIDOCAINE HCL (PF) 1 % IJ SOLN
INTRAMUSCULAR | Status: AC
  Filled 2021-09-24: qty 30

## 2021-09-24 MED ORDER — FENTANYL CITRATE (PF) 100 MCG/2ML IJ SOLN
25.0000 ug | Freq: Once | INTRAMUSCULAR | Status: AC
  Administered 2021-09-24: 25 ug via INTRAVENOUS

## 2021-09-24 MED ORDER — NITROGLYCERIN 0.4 MG SL SUBL
SUBLINGUAL_TABLET | SUBLINGUAL | Status: AC
  Administered 2021-09-24: 0.4 mg
  Filled 2021-09-24: qty 1

## 2021-09-24 MED ORDER — MIDAZOLAM HCL 2 MG/2ML IJ SOLN
INTRAMUSCULAR | Status: DC | PRN
  Administered 2021-09-24: 1 mg via INTRAVENOUS

## 2021-09-24 MED ORDER — METFORMIN HCL 500 MG PO TABS: ORAL_TABLET | ORAL | 0 refills | Status: DC

## 2021-09-24 MED ORDER — VERAPAMIL HCL 2.5 MG/ML IV SOLN
INTRAVENOUS | Status: DC | PRN
  Administered 2021-09-24: 10 mL via INTRA_ARTERIAL

## 2021-09-24 MED ORDER — HEPARIN SODIUM (PORCINE) 1000 UNIT/ML IJ SOLN
INTRAMUSCULAR | Status: DC | PRN
  Administered 2021-09-24 (×2): 4000 [IU] via INTRAVENOUS

## 2021-09-24 MED ORDER — SODIUM CHLORIDE 0.9 % WEIGHT BASED INFUSION
1.0000 mL/kg/h | INTRAVENOUS | Status: AC
Start: 2021-09-24 — End: 2021-09-24

## 2021-09-24 MED ORDER — FENTANYL CITRATE (PF) 100 MCG/2ML IJ SOLN
INTRAMUSCULAR | Status: DC | PRN
  Administered 2021-09-24: 25 ug via INTRAVENOUS

## 2021-09-24 MED ORDER — SODIUM CHLORIDE 0.9 % WEIGHT BASED INFUSION
3.0000 mL/kg/h | INTRAVENOUS | Status: AC
  Administered 2021-09-24: 3 mL/kg/h via INTRAVENOUS

## 2021-09-24 MED ORDER — ACETAMINOPHEN 325 MG PO TABS: 650.0000 mg | ORAL_TABLET | ORAL | Status: DC | PRN

## 2021-09-24 MED ORDER — ONDANSETRON HCL 4 MG/2ML IJ SOLN: 4.0000 mg | Freq: Four times a day (QID) | INTRAMUSCULAR | Status: DC | PRN

## 2021-09-24 MED ORDER — VERAPAMIL HCL 2.5 MG/ML IV SOLN
INTRAVENOUS | Status: AC
Start: 2021-09-24 — End: ?
  Filled 2021-09-24: qty 2

## 2021-09-24 SURGICAL SUPPLY — 13 items
BAND CMPR LRG ZPHR (HEMOSTASIS) ×1
BAND ZEPHYR COMPRESS 30 LONG (HEMOSTASIS) ×1 IMPLANT
CATH 5FR JL3.5 JR4 ANG PIG MP (CATHETERS) ×1 IMPLANT
CATH VISTA GUIDE 6FR XBLAD3.5 (CATHETERS) ×1 IMPLANT
GLIDESHEATH SLEND SS 6F .021 (SHEATH) ×1 IMPLANT
GUIDEWIRE INQWIRE 1.5J.035X260 (WIRE) IMPLANT
GUIDEWIRE PRESSURE X 175 (WIRE) ×1 IMPLANT
INQWIRE 1.5J .035X260CM (WIRE) ×2
KIT ESSENTIALS PG (KITS) ×1 IMPLANT
KIT HEART LEFT (KITS) ×2 IMPLANT
PACK CARDIAC CATHETERIZATION (CUSTOM PROCEDURE TRAY) ×2 IMPLANT
TRANSDUCER W/STOPCOCK (MISCELLANEOUS) ×2 IMPLANT
TUBING CIL FLEX 10 FLL-RA (TUBING) ×2 IMPLANT

## 2021-09-24 NOTE — Telephone Encounter (Signed)
Patient states he had a procedure done this morning and he is having pain.  He is wondering if Dr. Swaziland would prescribe him something for the pain.

## 2021-09-24 NOTE — Progress Notes (Signed)
  Echocardiogram 2D Echocardiogram has been performed.  Mario Arnold 09/24/2021, 11:20 AM

## 2021-09-24 NOTE — Interval H&P Note (Signed)
History and Physical Interval Note:  09/24/2021 8:34 AM  Mario Arnold  has presented today for surgery, with the diagnosis of abnormal stress test.  The various methods of treatment have been discussed with the patient and family. After consideration of risks, benefits and other options for treatment, the patient has consented to  Procedure(s): LEFT HEART CATH AND CORONARY ANGIOGRAPHY (N/A) as a surgical intervention.  The patient's history has been reviewed, patient examined, no change in status, stable for surgery.  I have reviewed the patient's chart and labs.  Questions were answered to the patient's satisfaction.   Cath Lab Visit (complete for each Cath Lab visit)  Clinical Evaluation Leading to the Procedure:   ACS: No.  Non-ACS:    Anginal Classification: CCS II  Anti-ischemic medical therapy: Minimal Therapy (1 class of medications)  Non-Invasive Test Results: Intermediate-risk stress test findings: cardiac mortality 1-3%/year  Prior CABG: No previous CABG        Theron Arista [facility] 09/24/2021 8:34 AM\

## 2021-09-24 NOTE — Telephone Encounter (Signed)
Spoke to patient he stated he got home around 2:30 pm this afternoon from having a cardiac cath.Stated he is having chest pain in center of chest.Rates pain # 7.Advised he needs to go to ED to be evaluated.Dr.Jordan is out of office.I will make him aware.

## 2021-09-24 NOTE — Discharge Instructions (Addendum)

## 2021-09-27 ENCOUNTER — Encounter (HOSPITAL_COMMUNITY): Payer: Self-pay | Admitting: Cardiology

## 2021-09-27 ENCOUNTER — Telehealth: Payer: Self-pay | Admitting: Nurse Practitioner

## 2021-09-27 ENCOUNTER — Other Ambulatory Visit: Payer: Self-pay | Admitting: Nurse Practitioner

## 2021-09-27 DIAGNOSIS — E119 Type 2 diabetes mellitus without complications: Secondary | ICD-10-CM

## 2021-09-27 MED ORDER — TRULICITY 0.75 MG/0.5ML ~~LOC~~ SOAJ
0.7500 mg | SUBCUTANEOUS | 1 refills | Status: DC
Start: 2021-09-27 — End: 2021-12-03

## 2021-09-27 NOTE — Telephone Encounter (Signed)
Patients wife called back and realized she had given the wrong name of the medication, it was not Trulicity it is [place].

## 2021-09-27 NOTE — Telephone Encounter (Signed)
I have added back trulicity 0.75 mg weekly. Will have to start from the beginning. Will check HgbA1c at next visit and adjust the dosing at that time .

## 2021-09-27 NOTE — Telephone Encounter (Signed)
This patient was put on Trulicity a little while back but could not afford it however now has met out of pocket deductible and wants to know if it can be sent in again?

## 2021-10-04 ENCOUNTER — Other Ambulatory Visit: Payer: Self-pay | Admitting: Nurse Practitioner

## 2021-10-04 ENCOUNTER — Other Ambulatory Visit: Payer: Self-pay | Admitting: Specialist

## 2021-10-04 ENCOUNTER — Other Ambulatory Visit: Payer: Self-pay | Admitting: Cardiology

## 2021-10-05 ENCOUNTER — Other Ambulatory Visit: Payer: Self-pay | Admitting: Specialist

## 2021-10-07 ENCOUNTER — Other Ambulatory Visit: Payer: Self-pay | Admitting: Surgery

## 2021-10-07 MED ORDER — TRAMADOL HCL 50 MG PO TABS: 50.0000 mg | ORAL_TABLET | Freq: Two times a day (BID) | ORAL | 0 refills | Status: DC | PRN

## 2021-10-07 NOTE — Progress Notes (Signed)
    [address]       [place] [address]             [phone]        Mario Arnold [place] Medical Record #[id number] Date of Birth: 05/18/1955  Referring: Jordan, Peter M, MD Primary Care: Boscia, Heather E, NP Primary Cardiologist:Peter Jordan, MD  Chief Complaint:    Chief Complaint  Patient presents with   Coronary Artery Disease    Surgical consult, Cardiac Cath and ECHO 09/24/21    History of Present Illness:     66-year-old male history of coronary artery disease presents for surgical evaluation.  He recently underwent a left heart cath which showed progression of three-vessel coronary disease.  He also has in-stent restenosis of his LAD stent.  He was being worked up for spinal surgery, and on evaluation he was noted to have anginal symptoms.  He does remain active but has dyspnea on exertion.  He denies any rest pains.     Past Medical and Surgical History: Previous Chest Surgery: no Previous Chest Radiation: no Diabetes Mellitus: Yes.  HbA1C 9.5 Creatinine: 1.2  Past Medical History:  Diagnosis Date   Arthritis    Carotid disease, bilateral (HCC) June 2013   less than 50% stenosis in the right and left    Chronic kidney disease 2017   stones   Coronary artery disease    STATUS POST ANGIOPLASTY OF THE LAD    Diabetes mellitus    TYPE 2   Dyslipidemia    History of transient ischemic attack (TIA)    on aggrenox   Hyperlipidemia    Myocardial infarction (HCC) 1999   stent placement- per patient has 2 stents   Stroke (HCC)    Thyroid nodule June 2013   had US with 4 mm small cystic nodule noted. Needs follow up US in 12 months. TSH is normal.     Past Surgical History:  Procedure Laterality Date   COLONOSCOPY     13 years ago in [place] normal exam per pt   CORONARY ANGIOPLASTY WITH STENT PLACEMENT  03/28/2001   LAD. EJECTION FRACTION IS 45%   INTRAVASCULAR PRESSURE WIRE/FFR STUDY N/A 09/24/2021   Procedure:  INTRAVASCULAR PRESSURE WIRE/FFR STUDY;  Surgeon: Jordan, Peter M, MD;  Location: [facility];  Service: Cardiovascular;  Laterality: N/A;   LEFT HEART CATH AND CORONARY ANGIOGRAPHY N/A 09/24/2021   Procedure: LEFT HEART CATH AND CORONARY ANGIOGRAPHY;  Surgeon: Jordan, Peter M, MD;  Location: [facility];  Service: Cardiovascular;  Laterality: N/A;   SHOULDER ARTHROSCOPY WITH OPEN ROTATOR CUFF REPAIR AND DISTAL CLAVICLE ACROMINECTOMY Right 09/05/2012   Procedure: RIGHT SHOULDER ARTHROSCOPY WITH LABRAL DEBRIDEMENT AND OPEN DISTAL CLAVICLE RESECTION, acromionectomy  AND OPEN ROTATOR CUFF REPAIR;  Surgeon: James P Aplington, MD;  Location: [facility];  Service: Orthopedics;  Laterality: Right;    Social History: Support: presents with his wife  Social History   Tobacco Use  Smoking Status Former   Packs/day: 1.50   Years: 23.00   Total pack years: 34.50   Types: Cigarettes   Quit date: 03/28/1997   Years since quitting: 24.5  Smokeless Tobacco Former   Types: Snuff, Chew  Tobacco Comments   as a teen    Social History   Substance and Sexual Activity  Alcohol Use Yes   Alcohol/week: 2.0 standard drinks of alcohol   Types: 2 Cans of beer per week   Comment: socially       Allergies  Allergen Reactions   Nsaids Other (See Comments)   Hydromorphone Nausea And Vomiting     Current Outpatient Medications  Medication Sig Dispense Refill   acetaminophen (TYLENOL) 500 MG tablet Take 1,000 mg by mouth every 8 (eight) hours as needed for mild pain.     aspirin EC 81 MG tablet Take 1 tablet (81 mg total) by mouth daily. Swallow whole. 90 tablet 3   atorvastatin (LIPITOR) 80 MG tablet Take 1 tablet by mouth once daily 90 tablet 0   blood glucose meter kit and supplies Dispense based on patient and insurance preference. Use up to four times daily as directed. (FOR ICD-10 E10.9, E11.9). 1 each 0   diclofenac (CATAFLAM) 50 MG tablet Take 1 tablet (50 mg total) by mouth 3 (three)  times daily. 90 tablet 2   Dulaglutide (TRULICITY) 0.75 MG/0.5ML SOPN Inject 0.75 mg into the skin once a week. 2 mL 1   ezetimibe (ZETIA) 10 MG tablet Take 1 tablet (10 mg total) by mouth every evening. 90 tablet 1   fenofibrate 160 MG tablet Take 1 tablet (160 mg total) by mouth daily. 90 tablet 1   glyBURIDE (DIABETA) 5 MG tablet TAKE 2 TABLETS BY MOUTH TWICE DAILY WITH A MEAL 180 tablet 2   isosorbide mononitrate (IMDUR) 60 MG 24 hr tablet Take 1 tablet by mouth once daily 90 tablet 0   losartan (COZAAR) 25 MG tablet Take 1 tablet by mouth once daily 90 tablet 0   metFORMIN (GLUCOPHAGE) 500 MG tablet TAKE 2 TABLETS BY MOUTH IN THE MORNING AND 2 IN THE EVENING 360 tablet 0   metoprolol tartrate (LOPRESSOR) 25 MG tablet Take 1 tablet (25 mg total) by mouth 2 (two) times daily. 180 tablet 1   nitroGLYCERIN (NITROSTAT) 0.4 MG SL tablet Place 1 tablet (0.4 mg total) under the tongue every 5 (five) minutes as needed. 25 tablet 6   omega-3 acid ethyl esters (LOVAZA) 1 g capsule Take 2 capsules by mouth twice daily 180 capsule 3   pioglitazone (ACTOS) 45 MG tablet Take 1 tablet (45 mg total) by mouth daily. 30 tablet 3   traMADol (ULTRAM) 50 MG tablet Take 1 tablet (50 mg total) by mouth every 12 (twelve) hours as needed. 30 tablet 0   No current facility-administered medications for this visit.    (Not in a hospital admission)   Family History  Problem Relation Age of Onset   Coronary artery disease Mother    Heart attack Mother    Diabetes Mother    Transient ischemic attack Mother    Heart disease Mother    Cancer Father    Colon cancer Neg Hx    Esophageal cancer Neg Hx    Colon polyps Neg Hx    Rectal cancer Neg Hx    Stomach cancer Neg Hx      Review of Systems:   Review of Systems  Constitutional:  Positive for malaise/fatigue. Negative for weight loss.  Respiratory:  Positive for shortness of breath.   Cardiovascular:  Positive for chest pain. Negative for palpitations.   Musculoskeletal:  Positive for back pain, joint pain and myalgias.      Physical Exam: BP 109/66 (BP Location: Left Arm, Patient Position: Sitting)   Pulse 65   Resp (!) 65   Ht 5' 7" (1.702 m)   Wt 184 lb (83.5 kg)   SpO2 96% Comment: RA  BMI 28.82 kg/m  Physical Exam Constitutional:        General: He is not in acute distress.    Appearance: Normal appearance. He is normal weight. He is not ill-appearing.  Eyes:     Extraocular Movements: Extraocular movements intact.  Cardiovascular:     Rate and Rhythm: Normal rate.  Pulmonary:     Effort: Pulmonary effort is normal. No respiratory distress.  Abdominal:     General: There is no distension.  Musculoskeletal:        General: Normal range of motion.     Cervical back: Normal range of motion.  Skin:    General: Skin is warm and dry.  Neurological:     General: No focal deficit present.     Mental Status: He is alert and oriented to person, place, and time.       Diagnostic Studies & Laboratory data:    Left Heart Catherization:  Intervention  Echo: IMPRESSIONS     1. Apical, mid and basal inferior wall hypokinesis . Left ventricular  ejection fraction, by estimation, is 45 to 50%. The left ventricle has  mildly decreased function. The left ventricle demonstrates regional wall  motion abnormalities (see scoring  diagram/findings for description). Left ventricular diastolic parameters  were normal.   2. Right ventricular systolic function is normal. The right ventricular  size is normal.   3. Left atrial size was mildly dilated.   4. The mitral valve is normal in structure. No evidence of mitral valve  regurgitation. No evidence of mitral stenosis.   5. The aortic valve is tricuspid. There is mild calcification of the  aortic valve. There is mild thickening of the aortic valve. Aortic valve  regurgitation is not visualized. Aortic valve sclerosis is present, with  no evidence of aortic valve stenosis.   6.  The inferior vena cava is normal in size with greater than 50%  respiratory variability, suggesting right atrial pressure of 3 mmHg.  EKG: Sinus brady I have independently reviewed the above radiologic studies and discussed with the patient   Recent Lab Findings: Lab Results  Component Value Date   WBC 4.1 09/23/2021   HGB 14.7 09/23/2021   HCT 43.4 09/23/2021   PLT 159 09/23/2021   GLUCOSE 181 (H) 09/23/2021   CHOL 117 09/23/2021   TRIG 107 09/23/2021   HDL 30 (L) 09/23/2021   LDLDIRECT 55.5 05/07/2013   LDLCALC 67 09/23/2021   ALT 40 09/29/2020   AST 35 09/29/2020   NA 137 09/23/2021   K 5.3 (H) 09/23/2021   CL 105 09/23/2021   CREATININE 1.21 09/23/2021   BUN 21 09/23/2021   CO2 22 09/23/2021   TSH 2.520 09/29/2020   INR 0.9 12/04/2019   HGBA1C 9.5 (H) 08/10/2021      Assessment / Plan:   66-year-old male with three-vessel coronary artery disease.  We discussed risks and benefits of surgical vascularization, and he is agreeable to proceed.  He will require grafting to the LAD, PDA and ramus intermedius.  The circumflex is a small vessel, and likely graftable.  We will also use his left radial artery as one of the conduits.  He is tentatively scheduled for October 12, 2021.     I  spent 40 minutes counseling the patient face to face.   Biviana Saddler O Luverna Degenhart 10/08/2021 12:18 PM       

## 2021-10-07 NOTE — H&P (View-Only) (Signed)
[address]       [place] [address]             [phone]        Makayla L Thrun [place] Medical Record #[id number] Date of Birth: [birth year]  Referring: Martinique, Peter M, MD Primary Care: Ronnell Freshwater, NP Primary Cardiologist:Peter Martinique, MD  Chief Complaint:    Chief Complaint  Patient presents with   Coronary Artery Disease    Surgical consult, Cardiac Cath and ECHO 09/24/21    History of Present Illness:     66 year old male history of coronary artery disease presents for surgical evaluation.  He recently underwent a left heart cath which showed progression of three-vessel coronary disease.  He also has in-stent restenosis of his LAD stent.  He was being worked up for spinal surgery, and on evaluation he was noted to have anginal symptoms.  He does remain active but has dyspnea on exertion.  He denies any rest pains.     Past Medical and Surgical History: Previous Chest Surgery: no Previous Chest Radiation: no Diabetes Mellitus: Yes.  HbA1C 9.5 Creatinine: 1.2  Past Medical History:  Diagnosis Date   Arthritis    Carotid disease, bilateral ([place]) June 2013   less than 50% stenosis in the right and left    Chronic kidney disease 2017   stones   Coronary artery disease    STATUS POST ANGIOPLASTY OF THE LAD    Diabetes mellitus    TYPE 2   Dyslipidemia    History of transient ischemic attack (TIA)    on aggrenox   Hyperlipidemia    Myocardial infarction [facility]) 1999   stent placement- per patient has 2 stents   Stroke [facility])    Thyroid nodule June 2013   had [place] with 4 mm small cystic nodule noted. Needs follow up US in 12 months. TSH is normal.     Past Surgical History:  Procedure Laterality Date   COLONOSCOPY     13 years ago in [facility] normal exam per pt   CORONARY ANGIOPLASTY WITH STENT PLACEMENT  03/28/2001   LAD. EJECTION FRACTION IS 45%   INTRAVASCULAR PRESSURE WIRE/FFR STUDY N/A 09/24/2021   Procedure:  INTRAVASCULAR PRESSURE WIRE/FFR STUDY;  Surgeon: Martinique, Peter M, MD;  Location: [place] [facility];  Service: Cardiovascular;  Laterality: N/A;   LEFT HEART CATH AND CORONARY ANGIOGRAPHY N/A 09/24/2021   Procedure: LEFT HEART CATH AND CORONARY ANGIOGRAPHY;  Surgeon: Martinique, Peter M, MD;  Location: [place] [facility];  Service: Cardiovascular;  Laterality: N/A;   SHOULDER ARTHROSCOPY WITH OPEN ROTATOR CUFF REPAIR AND DISTAL CLAVICLE ACROMINECTOMY Right 09/05/2012   Procedure: RIGHT SHOULDER ARTHROSCOPY WITH LABRAL DEBRIDEMENT AND OPEN DISTAL CLAVICLE RESECTION, acromionectomy  AND OPEN ROTATOR CUFF REPAIR;  Surgeon: Magnus Sinning, MD;  Location: [facility];  Service: Orthopedics;  Laterality: Right;    Social History: Support: presents with his wife  Social History   Tobacco Use  Smoking Status Former   Packs/day: 1.50   Years: 23.00   Total pack years: 34.50   Types: Cigarettes   Quit date: 03/28/1997   Years since quitting: 24.5  Smokeless Tobacco Former   Types: Snuff, Chew  Tobacco Comments   as a teen    Social History   Substance and Sexual Activity  Alcohol Use Yes   Alcohol/week: 2.0 standard drinks of alcohol   Types: 2 Cans of beer per week   Comment: socially  Allergies  Allergen Reactions   Nsaids Other (See Comments)   Hydromorphone Nausea And Vomiting     Current Outpatient Medications  Medication Sig Dispense Refill   acetaminophen (TYLENOL) 500 MG tablet Take 1,000 mg by mouth every 8 (eight) hours as needed for mild pain.     aspirin EC 81 MG tablet Take 1 tablet (81 mg total) by mouth daily. Swallow whole. 90 tablet 3   atorvastatin (LIPITOR) 80 MG tablet Take 1 tablet by mouth once daily 90 tablet 0   blood glucose meter kit and supplies Dispense based on patient and insurance preference. Use up to four times daily as directed. (FOR ICD-10 E10.9, E11.9). 1 each 0   diclofenac (CATAFLAM) 50 MG tablet Take 1 tablet (50 mg total) by mouth 3 (three)  times daily. 90 tablet 2   Dulaglutide (TRULICITY) 0.38 UE/2.8MK SOPN Inject 0.75 mg into the skin once a week. 2 mL 1   ezetimibe (ZETIA) 10 MG tablet Take 1 tablet (10 mg total) by mouth every evening. 90 tablet 1   fenofibrate 160 MG tablet Take 1 tablet (160 mg total) by mouth daily. 90 tablet 1   glyBURIDE (DIABETA) 5 MG tablet TAKE 2 TABLETS BY MOUTH TWICE DAILY WITH A MEAL 180 tablet 2   isosorbide mononitrate (IMDUR) 60 MG 24 hr tablet Take 1 tablet by mouth once daily 90 tablet 0   losartan (COZAAR) 25 MG tablet Take 1 tablet by mouth once daily 90 tablet 0   metFORMIN (GLUCOPHAGE) 500 MG tablet TAKE 2 TABLETS BY MOUTH IN THE MORNING AND 2 IN THE EVENING 360 tablet 0   metoprolol tartrate (LOPRESSOR) 25 MG tablet Take 1 tablet (25 mg total) by mouth 2 (two) times daily. 180 tablet 1   nitroGLYCERIN (NITROSTAT) 0.4 MG SL tablet Place 1 tablet (0.4 mg total) under the tongue every 5 (five) minutes as needed. 25 tablet 6   omega-3 acid ethyl esters (LOVAZA) 1 g capsule Take 2 capsules by mouth twice daily 180 capsule 3   pioglitazone (ACTOS) 45 MG tablet Take 1 tablet (45 mg total) by mouth daily. 30 tablet 3   traMADol (ULTRAM) 50 MG tablet Take 1 tablet (50 mg total) by mouth every 12 (twelve) hours as needed. 30 tablet 0   No current facility-administered medications for this visit.    (Not in a hospital admission)   Family History  Problem Relation Age of Onset   Coronary artery disease Mother    Heart attack Mother    Diabetes Mother    Transient ischemic attack Mother    Heart disease Mother    Cancer Father    Colon cancer Neg Hx    Esophageal cancer Neg Hx    Colon polyps Neg Hx    Rectal cancer Neg Hx    Stomach cancer Neg Hx      Review of Systems:   Review of Systems  Constitutional:  Positive for malaise/fatigue. Negative for weight loss.  Respiratory:  Positive for shortness of breath.   Cardiovascular:  Positive for chest pain. Negative for palpitations.   Musculoskeletal:  Positive for back pain, joint pain and myalgias.      Physical Exam: BP 109/66 (BP Location: Left Arm, Patient Position: Sitting)   Pulse 65   Resp (!) 65   Ht _0  (1.702 m)   Wt 184 lb (83.5 kg)   SpO2 96% Comment: RA  BMI 28.82 kg/m  Physical Exam Constitutional:  General: He is not in acute distress.    Appearance: Normal appearance. He is normal weight. He is not ill-appearing.  Eyes:     Extraocular Movements: Extraocular movements intact.  Cardiovascular:     Rate and Rhythm: Normal rate.  Pulmonary:     Effort: Pulmonary effort is normal. No respiratory distress.  Abdominal:     General: There is no distension.  Musculoskeletal:        General: Normal range of motion.     Cervical back: Normal range of motion.  Skin:    General: Skin is warm and dry.  Neurological:     General: No focal deficit present.     Mental Status: He is alert and oriented to person, place, and time.       Diagnostic Studies & Laboratory data:    Left Heart Catherization:  Intervention  Echo: IMPRESSIONS     1. Apical, mid and basal inferior wall hypokinesis . Left ventricular  ejection fraction, by estimation, is 45 to 50%. The left ventricle has  mildly decreased function. The left ventricle demonstrates regional wall  motion abnormalities (see scoring  diagram/findings for description). Left ventricular diastolic parameters  were normal.   2. Right ventricular systolic function is normal. The right ventricular  size is normal.   3. Left atrial size was mildly dilated.   4. The mitral valve is normal in structure. No evidence of mitral valve  regurgitation. No evidence of mitral stenosis.   5. The aortic valve is tricuspid. There is mild calcification of the  aortic valve. There is mild thickening of the aortic valve. Aortic valve  regurgitation is not visualized. Aortic valve sclerosis is present, with  no evidence of aortic valve stenosis.   6.  The inferior vena cava is normal in size with greater than 50%  respiratory variability, suggesting right atrial pressure of 3 mmHg.  EKG: Sinus brady I have independently reviewed the above radiologic studies and discussed with the patient   Recent Lab Findings: Lab Results  Component Value Date   WBC 4.1 09/23/2021   HGB 14.7 09/23/2021   HCT 43.4 09/23/2021   PLT 159 09/23/2021   GLUCOSE 181 (H) 09/23/2021   CHOL 117 09/23/2021   TRIG 107 09/23/2021   HDL 30 (L) 09/23/2021   LDLDIRECT 55.5 05/07/2013   LDLCALC 67 09/23/2021   ALT 40 09/29/2020   AST 35 09/29/2020   NA 137 09/23/2021   K 5.3 (H) 09/23/2021   CL 105 09/23/2021   CREATININE 1.21 09/23/2021   BUN 21 09/23/2021   CO2 22 09/23/2021   TSH 2.520 09/29/2020   INR 0.9 12/04/2019   HGBA1C 9.5 (H) 08/10/2021      Assessment / Plan:   66 year old male with three-vessel coronary artery disease.  We discussed risks and benefits of surgical vascularization, and he is agreeable to proceed.  He will require grafting to the LAD, PDA and ramus intermedius.  The circumflex is a small vessel, and likely graftable.  We will also use his left radial artery as one of the conduits.  He is tentatively scheduled for October 12, 2021.     I  spent 40 minutes counseling the patient face to face.   Lajuana Matte 10/08/2021 12:18 PM

## 2021-10-08 ENCOUNTER — Institutional Professional Consult (permissible substitution) (INDEPENDENT_AMBULATORY_CARE_PROVIDER_SITE_OTHER): Payer: 59 | Admitting: Thoracic Surgery (Cardiothoracic Vascular Surgery)

## 2021-10-08 ENCOUNTER — Other Ambulatory Visit: Payer: Self-pay | Admitting: *Deleted

## 2021-10-08 VITALS — BP 109/66 | HR 65 | Resp 65 | Ht 67.0 in | Wt 184.0 lb

## 2021-10-08 DIAGNOSIS — I2510 Atherosclerotic heart disease of native coronary artery without angina pectoris: Secondary | ICD-10-CM

## 2021-10-08 NOTE — Pre-Procedure Instructions (Addendum)
Mario Arnold  10/08/2021       Surgical Instructions   Your procedure is scheduled on Tues., October 12, 2021 from 7:30AM-12:48PM.  Report to [facility] Main Entrance "A" at 5:30 A.M., then check in with the Admitting office.  Call this number if you have problems the morning of surgery:  [phone]   Remember:  Do not eat or drink after midnight on July 17th   Take these medicines the morning of surgery with A SIP OF WATER: Atorvastatin (LIPITOR) Fenofibrate Isosorbide mononitrate (IMDUR) Metoprolol tartrate (LOPRESSOR)  If Needed: Acetaminophen (TYLENOL) NitroGLYCERIN (NITROSTAT) TraMADol (ULTRAM)  As of today, STOP taking any Aspirin (unless otherwise instructed by your surgeon) Aleve, Diclofenac (CATAFLAM), Naproxen, Ibuprofen, Motrin, Advil, Goody's, BC's, all herbal medications, fish oil, and all vitamins.  WHAT DO I DO ABOUT MY DIABETES MEDICATION?  Do not take the evening dose of GlyBURIDE (DIABETA) the night before surgery. Do not take GlyBURIDE (DIABETA), MetFORMIN (GLUCOPHAGE), Pioglitazone (ACTOS) the morning of surgery. The day of surgery, do not take other diabetes injectable Trulicity (dulaglutide). If your CBG is greater than 220 mg/dL, inform the staff upon arrival to Short Stay.  How to Manage Your Diabetes Before and After Surgery  Why is it important to control my blood sugar before and after surgery? Improving blood sugar levels before and after surgery helps healing and can limit problems. A way of improving blood sugar control is eating a healthy diet by:  Eating less sugar and carbohydrates  Increasing activity/exercise  Talking with your doctor about reaching your blood sugar goals High blood sugars (greater than 180 mg/dL) can raise your risk of infections and slow your recovery, so you will need to focus on controlling your diabetes during the weeks before surgery. Make sure that the doctor who takes care of your diabetes knows about your  planned surgery including the date and location.  How do I manage my blood sugar before surgery? Check your blood sugar the morning of your surgery when you wake up and every 2 hours until you get to the Short Stay unit. If your blood sugar is less than 70 mg/dL, you will need to treat for low blood sugar: Do not take insulin. Treat a low blood sugar (less than 70 mg/dL) with  cup of clear juice (cranberry or apple), 4 glucose tablets, OR glucose gel. Recheck blood sugar in 15 minutes after treatment (to make sure it is greater than 70 mg/dL). If your blood sugar is not greater than 70 mg/dL on recheck, call [phone] for further instructions. Report your blood sugar to the short stay nurse when you get to Short Stay.  If you are admitted to the hospital after surgery: Your blood sugar will be checked by the staff and you will probably be given insulin after surgery (instead of oral diabetes medicines) to make sure you have good blood sugar levels. The goal for blood sugar control after surgery is 80-180 mg/dL.  Reviewed and Endorsed by [facility] Patient Education Committee, August 2015             Day of Surgery: Do not wear jewelry. Do not wear lotions, powders, colognes, or deodorant. Do not shave 48 hours prior to surgery.  Men may shave face and neck. Do not bring valuables to the hospital.             [facility] is not responsible for any belongings or valuables.  Do NOT Smoke (Tobacco/Vaping)  24 hours  prior to your procedure  If you use a CPAP at night, you may bring your mask and machine for your overnight stay.   Contacts, glasses, hearing aids, dentures or partials may not be worn into surgery, please bring cases for these belongings   For patients admitted to the hospital, discharge time will be determined by your treatment team.   Patients discharged the day of surgery will not be allowed to drive home, and someone needs to stay with them for 24 hours.  Special  instructions:    Oral Hygiene is also important to reduce your risk of infection.  Remember - BRUSH YOUR TEETH THE MORNING OF SURGERY WITH YOUR REGULAR TOOTHPASTE  [place]- Preparing For Surgery  Before surgery, you can play an important role. Because skin is not sterile, your skin needs to be as free of germs as possible. You can reduce the number of germs on your skin by washing with CHG (chlorahexidine gluconate) Soap before surgery.  CHG is an antiseptic cleaner which kills germs and bonds with the skin to continue killing germs even after washing.    Please do not use if you have an allergy to CHG or antibacterial soaps. If your skin becomes reddened/irritated stop using the CHG.  Do not shave (including legs and underarms) for at least 48 hours prior to first CHG shower. It is OK to shave your face.  Please follow these instructions carefully.    Shower the NIGHT BEFORE SURGERY and the MORNING OF SURGERY with CHG Soap.   If you chose to wash your hair, wash your hair first as usual with your normal shampoo. After you shampoo, rinse your hair and body thoroughly to remove the shampoo.  Then [employer] and genitals (private parts) with your normal soap and rinse thoroughly to remove soap.  After that Use CHG Soap as you would any other liquid soap. You can apply CHG directly to the skin and wash gently with a scrungie or a clean washcloth.   Apply the CHG Soap to your body ONLY FROM THE NECK DOWN.  Do not use on open wounds or open sores. Avoid contact with your eyes, ears, mouth and genitals (private parts). Wash Face and genitals (private parts)  with your normal soap.   Wash thoroughly, paying special attention to the area where your surgery will be performed.  Thoroughly rinse your body with warm water from the neck down.  DO NOT shower/wash with your normal soap after using and rinsing off the CHG Soap.  Pat yourself dry with a CLEAN TOWEL.  Wear CLEAN PAJAMAS to bed the night  before surgery  Place CLEAN SHEETS on your bed the night before your surgery  DO NOT SLEEP WITH PETS.  Reminder: Take a shower with CHG soap. Wear Clean/Comfortable clothing the morning of surgery Do not apply any deodorants/lotions.   Remember to brush your teeth WITH YOUR REGULAR TOOTHPASTE.  If you received a COVID test during your pre-op visit  it is requested that you wear a mask when out in public, stay away from anyone that may not be feeling well and notify your surgeon if you develop symptoms. If you have been in contact with anyone that has tested positive in the last 10 days please notify you surgeon.  Notify your provider:  if you develop a fever of 100.4 or greater, sneezing, cough, sore throat, shortness of breath or body aches.  NO VISITORS WILL BE ALLOWED IN PRE-OP WHERE PATIENTS ARE PREPPED  FOR SURGERY.    SURGICAL WAITING ROOM VISITATION Patients having surgery or a procedure in a hospital may have two support people. Children under the age of 30 must have an adult with them who is not the patient. They may stay in the waiting area during the procedure and may switch out with other visitors. If the patient needs to stay at the hospital during part of their recovery, the visitor guidelines for inpatient rooms apply.  Please refer to the [facility] website for the visitor guidelines for Inpatients (after your surgery is over and you are in a regular room).   Please read over the following fact sheets that you were given.

## 2021-10-11 ENCOUNTER — Encounter (HOSPITAL_COMMUNITY): Payer: Self-pay

## 2021-10-11 ENCOUNTER — Ambulatory Visit (HOSPITAL_COMMUNITY)
Admission: RE | Admit: 2021-10-11 | Discharge: 2021-10-11 | Disposition: A | Discharge status: Home / Self Care | Payer: 59 | Source: Ambulatory Visit | Attending: Thoracic Surgery (Cardiothoracic Vascular Surgery) | Admitting: Thoracic Surgery (Cardiothoracic Vascular Surgery)

## 2021-10-11 ENCOUNTER — Ambulatory Visit (HOSPITAL_BASED_OUTPATIENT_CLINIC_OR_DEPARTMENT_OTHER)
Admission: RE | Admit: 2021-10-11 | Discharge: 2021-10-11 | Disposition: A | Discharge status: Home / Self Care | Payer: 59 | Source: Ambulatory Visit | Attending: Thoracic Surgery (Cardiothoracic Vascular Surgery) | Admitting: Thoracic Surgery (Cardiothoracic Vascular Surgery)

## 2021-10-11 ENCOUNTER — Encounter (HOSPITAL_COMMUNITY)
Admission: RE | Admit: 2021-10-11 | Discharge: 2021-10-11 | Disposition: A | Discharge status: Home / Self Care | Payer: 59 | Source: Ambulatory Visit | Attending: Thoracic Surgery (Cardiothoracic Vascular Surgery) | Admitting: Thoracic Surgery (Cardiothoracic Vascular Surgery)

## 2021-10-11 ENCOUNTER — Other Ambulatory Visit: Payer: Self-pay

## 2021-10-11 VITALS — BP 112/68 | HR 55 | Temp 97.4°F | Ht 67.0 in | Wt 182.1 lb

## 2021-10-11 DIAGNOSIS — Z01818 Encounter for other preprocedural examination: Secondary | ICD-10-CM | POA: Insufficient documentation

## 2021-10-11 DIAGNOSIS — Z87891 Personal history of nicotine dependence: Secondary | ICD-10-CM | POA: Insufficient documentation

## 2021-10-11 DIAGNOSIS — N189 Chronic kidney disease, unspecified: Secondary | ICD-10-CM | POA: Insufficient documentation

## 2021-10-11 DIAGNOSIS — Z20822 Contact with and (suspected) exposure to covid-19: Secondary | ICD-10-CM | POA: Insufficient documentation

## 2021-10-11 DIAGNOSIS — Z951 Presence of aortocoronary bypass graft: Secondary | ICD-10-CM | POA: Insufficient documentation

## 2021-10-11 DIAGNOSIS — E119 Type 2 diabetes mellitus without complications: Secondary | ICD-10-CM

## 2021-10-11 DIAGNOSIS — I2510 Atherosclerotic heart disease of native coronary artery without angina pectoris: Secondary | ICD-10-CM

## 2021-10-11 DIAGNOSIS — Z01810 Encounter for preprocedural cardiovascular examination: Secondary | ICD-10-CM

## 2021-10-11 DIAGNOSIS — E785 Hyperlipidemia, unspecified: Secondary | ICD-10-CM | POA: Insufficient documentation

## 2021-10-11 DIAGNOSIS — E1122 Type 2 diabetes mellitus with diabetic chronic kidney disease: Secondary | ICD-10-CM | POA: Insufficient documentation

## 2021-10-11 HISTORY — DX: Personal history of urinary calculi: Z87.442

## 2021-10-11 LAB — GLUCOSE, CAPILLARY: Glucose-Capillary: 179 mg/dL — ABNORMAL HIGH (ref 70–99)

## 2021-10-11 LAB — COMPREHENSIVE METABOLIC PANEL
ALT: 31 U/L (ref 0–44)
AST: 30 U/L (ref 15–41)
Albumin: 3.9 g/dL (ref 3.5–5.0)
Alkaline Phosphatase: 71 U/L (ref 38–126)
Anion gap: 9 (ref 5–15)
BUN: 29 mg/dL — ABNORMAL HIGH (ref 8–23)
CO2: 18 mmol/L — ABNORMAL LOW (ref 22–32)
Calcium: 9.9 mg/dL (ref 8.9–10.3)
Chloride: 108 mmol/L (ref 98–111)
Creatinine, Ser: 1.53 mg/dL — ABNORMAL HIGH (ref 0.61–1.24)
GFR, Estimated: 50 mL/min — ABNORMAL LOW (ref >60)
Glucose, Bld: 163 mg/dL — ABNORMAL HIGH (ref 70–99)
Potassium: 4.7 mmol/L (ref 3.5–5.1)
Sodium: 135 mmol/L (ref 135–145)
Total Bilirubin: 0.4 mg/dL (ref 0.3–1.2)
Total Protein: 6.8 g/dL (ref 6.5–8.1)

## 2021-10-11 LAB — URINALYSIS, ROUTINE W REFLEX MICROSCOPIC
Bilirubin Urine: NEGATIVE
Glucose, UA: NEGATIVE mg/dL
Hgb urine dipstick: NEGATIVE
Ketones, ur: NEGATIVE mg/dL
Leukocytes,Ua: NEGATIVE
Nitrite: NEGATIVE
Protein, ur: NEGATIVE mg/dL
Specific Gravity, Urine: 1.017 (ref 1.005–1.030)
pH: 5 (ref 5.0–8.0)

## 2021-10-11 LAB — TYPE AND SCREEN
ABO/RH(D): O POS
Antibody Screen: NEGATIVE

## 2021-10-11 LAB — HEMOGLOBIN A1C
Hgb A1c MFr Bld: 10 % — ABNORMAL HIGH (ref 4.8–5.6)
Mean Plasma Glucose: 240.3 mg/dL

## 2021-10-11 LAB — BLOOD GAS, ARTERIAL
Acid-base deficit: 2.3 mmol/L — ABNORMAL HIGH (ref 0.0–2.0)
Bicarbonate: 22.5 mmol/L (ref 20.0–28.0)
Drawn by: 58793
O2 Saturation: 98.7 %
Patient temperature: 37
pCO2 arterial: 38 mmHg (ref 32–48)
pH, Arterial: 7.38 (ref 7.35–7.45)
pO2, Arterial: 109 mmHg — ABNORMAL HIGH (ref 83–108)

## 2021-10-11 LAB — PROTIME-INR
INR: 1.1 (ref 0.8–1.2)
Prothrombin Time: 13.6 seconds (ref 11.4–15.2)

## 2021-10-11 LAB — CBC
HCT: 45.7 % (ref 39.0–52.0)
Hemoglobin: 14.8 g/dL (ref 13.0–17.0)
MCH: 29 pg (ref 26.0–34.0)
MCHC: 32.4 g/dL (ref 30.0–36.0)
MCV: 89.6 fL (ref 80.0–100.0)
Platelets: 189 10*3/uL (ref 150–400)
RBC: 5.1 MIL/uL (ref 4.22–5.81)
RDW: 12.6 % (ref 11.5–15.5)
WBC: 5.5 10*3/uL (ref 4.0–10.5)
nRBC: 0 % (ref 0.0–0.2)

## 2021-10-11 LAB — SURGICAL PCR SCREEN
MRSA, PCR: NEGATIVE
Staphylococcus aureus: NEGATIVE

## 2021-10-11 LAB — APTT: aPTT: 27 seconds (ref 24–36)

## 2021-10-11 LAB — SARS CORONAVIRUS 2 BY RT PCR: SARS Coronavirus 2 by RT PCR: NEGATIVE

## 2021-10-11 MED ORDER — MILRINONE LACTATE IN DEXTROSE 20-5 MG/100ML-% IV SOLN
0.3000 ug/kg/min | INTRAVENOUS | Status: DC
  Filled 2021-10-11: qty 100

## 2021-10-11 MED ORDER — CEFAZOLIN SODIUM-DEXTROSE 2-4 GM/100ML-% IV SOLN
2.0000 g | INTRAVENOUS | Status: AC
  Administered 2021-10-12 (×2): 2 g via INTRAVENOUS
  Filled 2021-10-11: qty 100

## 2021-10-11 MED ORDER — POTASSIUM CHLORIDE 2 MEQ/ML IV SOLN
80.0000 meq | INTRAVENOUS | Status: DC
  Filled 2021-10-11: qty 40

## 2021-10-11 MED ORDER — PLASMA-LYTE A IV SOLN
INTRAVENOUS | Status: DC
  Filled 2021-10-11: qty 2.5

## 2021-10-11 MED ORDER — PHENYLEPHRINE HCL-NACL 20-0.9 MG/250ML-% IV SOLN
30.0000 ug/min | INTRAVENOUS | Status: AC
  Administered 2021-10-12: 25 ug/min via INTRAVENOUS
  Filled 2021-10-11: qty 250

## 2021-10-11 MED ORDER — INSULIN REGULAR(HUMAN) IN NACL 100-0.9 UT/100ML-% IV SOLN
INTRAVENOUS | Status: AC
  Administered 2021-10-12: 2.4 [IU]/h via INTRAVENOUS
  Filled 2021-10-11: qty 100

## 2021-10-11 MED ORDER — NOREPINEPHRINE 4 MG/250ML-% IV SOLN
0.0000 ug/min | INTRAVENOUS | Status: DC
  Filled 2021-10-11: qty 250

## 2021-10-11 MED ORDER — TRANEXAMIC ACID 1000 MG/10ML IV SOLN
1.5000 mg/kg/h | INTRAVENOUS | Status: AC
  Administered 2021-10-12: 1.5 mg/kg/h via INTRAVENOUS
  Filled 2021-10-11: qty 25

## 2021-10-11 MED ORDER — VANCOMYCIN HCL 1250 MG/250ML IV SOLN
1250.0000 mg | INTRAVENOUS | Status: AC
  Administered 2021-10-12: 1250 mg via INTRAVENOUS
  Filled 2021-10-11: qty 250

## 2021-10-11 MED ORDER — HEPARIN 30,000 UNITS/1000 ML (OHS) CELLSAVER SOLUTION
Status: DC
  Filled 2021-10-11: qty 1000

## 2021-10-11 MED ORDER — MANNITOL 20 % IV SOLN
INTRAVENOUS | Status: DC
  Filled 2021-10-11: qty 13

## 2021-10-11 MED ORDER — EPINEPHRINE HCL 5 MG/250ML IV SOLN IN NS
0.0000 ug/min | INTRAVENOUS | Status: DC
  Filled 2021-10-11: qty 250

## 2021-10-11 MED ORDER — TRANEXAMIC ACID (OHS) PUMP PRIME SOLUTION
2.0000 mg/kg | INTRAVENOUS | Status: DC
  Filled 2021-10-11: qty 1.67

## 2021-10-11 MED ORDER — DEXMEDETOMIDINE HCL IN NACL 400 MCG/100ML IV SOLN
0.1000 ug/kg/h | INTRAVENOUS | Status: AC
  Administered 2021-10-12: .5 ug/kg/h via INTRAVENOUS
  Filled 2021-10-11: qty 100

## 2021-10-11 MED ORDER — TRANEXAMIC ACID (OHS) BOLUS VIA INFUSION
15.0000 mg/kg | INTRAVENOUS | Status: AC
  Administered 2021-10-12: 1096.5 mg via INTRAVENOUS
  Filled 2021-10-11: qty 1097

## 2021-10-11 MED ORDER — CEFAZOLIN SODIUM-DEXTROSE 2-4 GM/100ML-% IV SOLN
2.0000 g | INTRAVENOUS | Status: DC
  Filled 2021-10-11: qty 100

## 2021-10-11 MED ORDER — NITROGLYCERIN IN D5W 200-5 MCG/ML-% IV SOLN
2.0000 ug/min | INTRAVENOUS | Status: AC
  Administered 2021-10-12: 10 ug/min via INTRAVENOUS
  Filled 2021-10-11: qty 250

## 2021-10-11 NOTE — Progress Notes (Signed)
VASCULAR LAB    Pre CABG Dopplers have been performed.  See CV proc for preliminary results.   Bedford Winsor, RVT 10/11/2021, 11:01 AM

## 2021-10-11 NOTE — Progress Notes (Addendum)
Mr. Prime deneis chest pain or shortness of breath. Patient denies having any s/s of Covid in his household.  Patient denies any known exposure to Covid.   PCP - Huey Romans, NP  Cardiologist - Dr. Peter Swaziland  EP-no  Endocrine-no  Pulm-no  Chest x-ray - 10/11/21  EKG - 09/24/21  Stress Test - 09/10/21  ECHO - 09/24/21  Cardiac Cath - 09/24/21  AICD-np PM-no LOOP-no  Nerve Stimulator-no  Dialysis-no  Sleep Study - no CPAP - no  LABS-CBC, CMP, PT, PTT, A1C, T/S, UA, Covid, PCR.  ASA- will not take Tuesday AM.  ERAS-  HA1C-10/11/21-10 Fasting Blood Sugar - "200's" Checks Blood Sugar ____1_ times a day Mr. Mukai reports that CBG has been higher    recently because his insurance would not pay for Truliticy Anesthesia-  Pt denies having chest pain, sob, or fever at this time. All instructions explained to the pt, with a verbal understanding of the material. Pt agrees to go over the instructions while at home for a better understanding. Pt also instructed to self quarantine after being tested for COVID-19. The opportunity to ask questions was provided.

## 2021-10-11 NOTE — Anesthesia Preprocedure Evaluation (Signed)
Anesthesia Evaluation  Patient identified by MRN, date of birth, ID band Patient awake    Reviewed: Allergy & Precautions, H&P , NPO status , Patient's Chart, lab work & pertinent test results  Airway Mallampati: II  TM Distance: >3 FB Neck ROM: Full    Dental no notable dental hx. (+) Teeth Intact, Dental Advisory Given   Pulmonary neg pulmonary ROS, former smoker,    Pulmonary exam normal breath sounds clear to auscultation       Cardiovascular Exercise Tolerance: Good hypertension, Pt. on medications and Pt. on home beta blockers + angina + CAD, + Past MI and + Cardiac Stents   Rhythm:Regular Rate:Normal     Neuro/Psych TIACVA negative psych ROS   GI/Hepatic negative GI ROS, Neg liver ROS,   Endo/Other  diabetes, Type 2, Oral Hypoglycemic Agents  Renal/GU Renal InsufficiencyRenal disease  negative genitourinary   Musculoskeletal  (+) Arthritis , Osteoarthritis,    Abdominal   Peds  Hematology negative hematology ROS (+)   Anesthesia Other Findings   Reproductive/Obstetrics negative OB ROS                            Anesthesia Physical Anesthesia Plan  ASA: 4  Anesthesia Plan: General   Post-op Pain Management: Tylenol PO (pre-op)*   Induction: Intravenous  PONV Risk Score and Plan: 2 and Midazolam and Ondansetron  Airway Management Planned: Oral ETT  Additional Equipment: Arterial line, CVP, TEE and Ultrasound Guidance Line Placement  Intra-op Plan:   Post-operative Plan: Post-operative intubation/ventilation  Informed Consent: I have reviewed the patients History and Physical, chart, labs and discussed the procedure including the risks, benefits and alternatives for the proposed anesthesia with the patient or authorized representative who has indicated his/her understanding and acceptance.     Dental advisory given  Plan Discussed with: CRNA  Anesthesia Plan  Comments:        Anesthesia Quick Evaluation

## 2021-10-12 ENCOUNTER — Inpatient Hospital Stay (HOSPITAL_COMMUNITY): Payer: 59 | Admitting: Anesthesiology

## 2021-10-12 ENCOUNTER — Telehealth (HOSPITAL_COMMUNITY): Payer: Self-pay | Admitting: Pharmacy Technician

## 2021-10-12 ENCOUNTER — Inpatient Hospital Stay (HOSPITAL_COMMUNITY): Payer: 59 | Admitting: Emergency Medicine

## 2021-10-12 ENCOUNTER — Inpatient Hospital Stay (HOSPITAL_COMMUNITY)
Admission: RE | Disposition: A | Discharge status: Home / Self Care | Payer: Self-pay | Source: Home / Self Care | Attending: Thoracic Surgery (Cardiothoracic Vascular Surgery)

## 2021-10-12 ENCOUNTER — Inpatient Hospital Stay (HOSPITAL_COMMUNITY)
Admission: RE | Admit: 2021-10-12 | Discharge: 2021-10-17 | DRG: 236 | Disposition: A | Discharge status: Home / Self Care | Payer: 59 | Attending: Thoracic Surgery (Cardiothoracic Vascular Surgery) | Admitting: Thoracic Surgery (Cardiothoracic Vascular Surgery)

## 2021-10-12 ENCOUNTER — Other Ambulatory Visit: Payer: Self-pay

## 2021-10-12 ENCOUNTER — Inpatient Hospital Stay (HOSPITAL_COMMUNITY): Payer: 59

## 2021-10-12 ENCOUNTER — Other Ambulatory Visit (HOSPITAL_COMMUNITY): Payer: Self-pay

## 2021-10-12 DIAGNOSIS — I25119 Atherosclerotic heart disease of native coronary artery with unspecified angina pectoris: Secondary | ICD-10-CM | POA: Diagnosis present

## 2021-10-12 DIAGNOSIS — E785 Hyperlipidemia, unspecified: Secondary | ICD-10-CM | POA: Diagnosis present

## 2021-10-12 DIAGNOSIS — Z7982 Long term (current) use of aspirin: Secondary | ICD-10-CM

## 2021-10-12 DIAGNOSIS — I252 Old myocardial infarction: Secondary | ICD-10-CM | POA: Diagnosis not present

## 2021-10-12 DIAGNOSIS — Z809 Family history of malignant neoplasm, unspecified: Secondary | ICD-10-CM | POA: Diagnosis not present

## 2021-10-12 DIAGNOSIS — I110 Hypertensive heart disease with heart failure: Secondary | ICD-10-CM | POA: Diagnosis present

## 2021-10-12 DIAGNOSIS — Z8249 Family history of ischemic heart disease and other diseases of the circulatory system: Secondary | ICD-10-CM

## 2021-10-12 DIAGNOSIS — I2510 Atherosclerotic heart disease of native coronary artery without angina pectoris: Secondary | ICD-10-CM | POA: Diagnosis present

## 2021-10-12 DIAGNOSIS — Z87891 Personal history of nicotine dependence: Secondary | ICD-10-CM

## 2021-10-12 DIAGNOSIS — I509 Heart failure, unspecified: Secondary | ICD-10-CM | POA: Diagnosis present

## 2021-10-12 DIAGNOSIS — Z7984 Long term (current) use of oral hypoglycemic drugs: Secondary | ICD-10-CM

## 2021-10-12 DIAGNOSIS — Z8673 Personal history of transient ischemic attack (TIA), and cerebral infarction without residual deficits: Secondary | ICD-10-CM | POA: Diagnosis not present

## 2021-10-12 DIAGNOSIS — D6959 Other secondary thrombocytopenia: Secondary | ICD-10-CM | POA: Diagnosis not present

## 2021-10-12 DIAGNOSIS — Z01810 Encounter for preprocedural cardiovascular examination: Principal | ICD-10-CM

## 2021-10-12 DIAGNOSIS — Z20822 Contact with and (suspected) exposure to covid-19: Secondary | ICD-10-CM | POA: Diagnosis present

## 2021-10-12 DIAGNOSIS — Z833 Family history of diabetes mellitus: Secondary | ICD-10-CM

## 2021-10-12 DIAGNOSIS — Z951 Presence of aortocoronary bypass graft: Secondary | ICD-10-CM

## 2021-10-12 DIAGNOSIS — E119 Type 2 diabetes mellitus without complications: Secondary | ICD-10-CM | POA: Diagnosis present

## 2021-10-12 DIAGNOSIS — Z886 Allergy status to analgesic agent status: Secondary | ICD-10-CM | POA: Diagnosis not present

## 2021-10-12 DIAGNOSIS — Z79899 Other long term (current) drug therapy: Secondary | ICD-10-CM | POA: Diagnosis not present

## 2021-10-12 HISTORY — PX: CORONARY ARTERY BYPASS GRAFT: SHX141

## 2021-10-12 HISTORY — PX: RADIAL ARTERY HARVEST: SHX5067

## 2021-10-12 HISTORY — PX: TEE WITHOUT CARDIOVERSION: SHX5443

## 2021-10-12 LAB — BASIC METABOLIC PANEL
Anion gap: 7 (ref 5–15)
BUN: 21 mg/dL (ref 8–23)
CO2: 21 mmol/L — ABNORMAL LOW (ref 22–32)
Calcium: 8.6 mg/dL — ABNORMAL LOW (ref 8.9–10.3)
Chloride: 110 mmol/L (ref 98–111)
Creatinine, Ser: 1.24 mg/dL (ref 0.61–1.24)
GFR, Estimated: >60 mL/min (ref >60)
Glucose, Bld: 136 mg/dL — ABNORMAL HIGH (ref 70–99)
Potassium: 4.3 mmol/L (ref 3.5–5.1)
Sodium: 138 mmol/L (ref 135–145)

## 2021-10-12 LAB — POCT I-STAT 7, (LYTES, BLD GAS, ICA,H+H)
Acid-base deficit: 1 mmol/L (ref 0.0–2.0)
Acid-base deficit: 3 mmol/L — ABNORMAL HIGH (ref 0.0–2.0)
Acid-base deficit: 4 mmol/L — ABNORMAL HIGH (ref 0.0–2.0)
Acid-base deficit: 5 mmol/L — ABNORMAL HIGH (ref 0.0–2.0)
Acid-base deficit: 7 mmol/L — ABNORMAL HIGH (ref 0.0–2.0)
Acid-base deficit: 3 mmol/L — ABNORMAL HIGH (ref 0.0–2.0)
Bicarbonate: 25 mmol/L (ref 20.0–28.0)
Bicarbonate: 22.6 mmol/L (ref 20.0–28.0)
Bicarbonate: 22.5 mmol/L (ref 20.0–28.0)
Bicarbonate: 21.2 mmol/L (ref 20.0–28.0)
Bicarbonate: 19.6 mmol/L — ABNORMAL LOW (ref 20.0–28.0)
Bicarbonate: 22.7 mmol/L (ref 20.0–28.0)
Calcium, Ion: 1.13 mmol/L — ABNORMAL LOW (ref 1.15–1.40)
Calcium, Ion: 1.47 mmol/L — ABNORMAL HIGH (ref 1.15–1.40)
Calcium, Ion: 1.42 mmol/L — ABNORMAL HIGH (ref 1.15–1.40)
Calcium, Ion: 1.32 mmol/L (ref 1.15–1.40)
Calcium, Ion: 1.29 mmol/L (ref 1.15–1.40)
Calcium, Ion: 1.23 mmol/L (ref 1.15–1.40)
HCT: 30 % — ABNORMAL LOW (ref 39.0–52.0)
HCT: 26 % — ABNORMAL LOW (ref 39.0–52.0)
HCT: 33 % — ABNORMAL LOW (ref 39.0–52.0)
HCT: 33 % — ABNORMAL LOW (ref 39.0–52.0)
HCT: 34 % — ABNORMAL LOW (ref 39.0–52.0)
HCT: 32 % — ABNORMAL LOW (ref 39.0–52.0)
Hemoglobin: 10.2 g/dL — ABNORMAL LOW (ref 13.0–17.0)
Hemoglobin: 8.8 g/dL — ABNORMAL LOW (ref 13.0–17.0)
Hemoglobin: 11.2 g/dL — ABNORMAL LOW (ref 13.0–17.0)
Hemoglobin: 11.2 g/dL — ABNORMAL LOW (ref 13.0–17.0)
Hemoglobin: 11.6 g/dL — ABNORMAL LOW (ref 13.0–17.0)
Hemoglobin: 10.9 g/dL — ABNORMAL LOW (ref 13.0–17.0)
O2 Saturation: 100 %
O2 Saturation: 100 %
O2 Saturation: 96 %
O2 Saturation: 98 %
O2 Saturation: 95 %
O2 Saturation: 96 %
Patient temperature: 36.5
Patient temperature: 36.5
Patient temperature: 36.6
Patient temperature: 36.8
Potassium: 4.7 mmol/L (ref 3.5–5.1)
Potassium: 4.4 mmol/L (ref 3.5–5.1)
Potassium: 4.2 mmol/L (ref 3.5–5.1)
Potassium: 4.4 mmol/L (ref 3.5–5.1)
Potassium: 4.1 mmol/L (ref 3.5–5.1)
Potassium: 3.9 mmol/L (ref 3.5–5.1)
Sodium: 141 mmol/L (ref 135–145)
Sodium: 137 mmol/L (ref 135–145)
Sodium: 139 mmol/L (ref 135–145)
Sodium: 142 mmol/L (ref 135–145)
Sodium: 142 mmol/L (ref 135–145)
Sodium: 144 mmol/L (ref 135–145)
TCO2: 26 mmol/L (ref 22–32)
TCO2: 24 mmol/L (ref 22–32)
TCO2: 24 mmol/L (ref 22–32)
TCO2: 22 mmol/L (ref 22–32)
TCO2: 21 mmol/L — ABNORMAL LOW (ref 22–32)
TCO2: 24 mmol/L (ref 22–32)
pCO2 arterial: 44.7 mmHg (ref 32–48)
pCO2 arterial: 41.2 mmHg (ref 32–48)
pCO2 arterial: 44 mmHg (ref 32–48)
pCO2 arterial: 42.4 mmHg (ref 32–48)
pCO2 arterial: 41.3 mmHg (ref 32–48)
pCO2 arterial: 44.2 mmHg (ref 32–48)
pH, Arterial: 7.356 (ref 7.35–7.45)
pH, Arterial: 7.348 — ABNORMAL LOW (ref 7.35–7.45)
pH, Arterial: 7.315 — ABNORMAL LOW (ref 7.35–7.45)
pH, Arterial: 7.304 — ABNORMAL LOW (ref 7.35–7.45)
pH, Arterial: 7.281 — ABNORMAL LOW (ref 7.35–7.45)
pH, Arterial: 7.318 — ABNORMAL LOW (ref 7.35–7.45)
pO2, Arterial: 343 mmHg — ABNORMAL HIGH (ref 83–108)
pO2, Arterial: 187 mmHg — ABNORMAL HIGH (ref 83–108)
pO2, Arterial: 91 mmHg (ref 83–108)
pO2, Arterial: 112 mmHg — ABNORMAL HIGH (ref 83–108)
pO2, Arterial: 86 mmHg (ref 83–108)
pO2, Arterial: 88 mmHg (ref 83–108)

## 2021-10-12 LAB — POCT I-STAT, CHEM 8
BUN: 26 mg/dL — ABNORMAL HIGH (ref 8–23)
BUN: 24 mg/dL — ABNORMAL HIGH (ref 8–23)
BUN: 24 mg/dL — ABNORMAL HIGH (ref 8–23)
BUN: 23 mg/dL (ref 8–23)
BUN: 25 mg/dL — ABNORMAL HIGH (ref 8–23)
Calcium, Ion: 1.49 mmol/L — ABNORMAL HIGH (ref 1.15–1.40)
Calcium, Ion: 1.39 mmol/L (ref 1.15–1.40)
Calcium, Ion: 1.2 mmol/L (ref 1.15–1.40)
Calcium, Ion: 1.2 mmol/L (ref 1.15–1.40)
Calcium, Ion: 1.45 mmol/L — ABNORMAL HIGH (ref 1.15–1.40)
Chloride: 105 mmol/L (ref 98–111)
Chloride: 108 mmol/L (ref 98–111)
Chloride: 103 mmol/L (ref 98–111)
Chloride: 103 mmol/L (ref 98–111)
Chloride: 106 mmol/L (ref 98–111)
Creatinine, Ser: 1.4 mg/dL — ABNORMAL HIGH (ref 0.61–1.24)
Creatinine, Ser: 1.2 mg/dL (ref 0.61–1.24)
Creatinine, Ser: 1.1 mg/dL (ref 0.61–1.24)
Creatinine, Ser: 1.1 mg/dL (ref 0.61–1.24)
Creatinine, Ser: 1.2 mg/dL (ref 0.61–1.24)
Glucose, Bld: 129 mg/dL — ABNORMAL HIGH (ref 70–99)
Glucose, Bld: 129 mg/dL — ABNORMAL HIGH (ref 70–99)
Glucose, Bld: 109 mg/dL — ABNORMAL HIGH (ref 70–99)
Glucose, Bld: 141 mg/dL — ABNORMAL HIGH (ref 70–99)
Glucose, Bld: 156 mg/dL — ABNORMAL HIGH (ref 70–99)
HCT: 41 % (ref 39.0–52.0)
HCT: 37 % — ABNORMAL LOW (ref 39.0–52.0)
HCT: 28 % — ABNORMAL LOW (ref 39.0–52.0)
HCT: 27 % — ABNORMAL LOW (ref 39.0–52.0)
HCT: 28 % — ABNORMAL LOW (ref 39.0–52.0)
Hemoglobin: 13.9 g/dL (ref 13.0–17.0)
Hemoglobin: 12.6 g/dL — ABNORMAL LOW (ref 13.0–17.0)
Hemoglobin: 9.5 g/dL — ABNORMAL LOW (ref 13.0–17.0)
Hemoglobin: 9.2 g/dL — ABNORMAL LOW (ref 13.0–17.0)
Hemoglobin: 9.5 g/dL — ABNORMAL LOW (ref 13.0–17.0)
Potassium: 6 mmol/L — ABNORMAL HIGH (ref 3.5–5.1)
Potassium: 4.5 mmol/L (ref 3.5–5.1)
Potassium: 5.3 mmol/L — ABNORMAL HIGH (ref 3.5–5.1)
Potassium: 4.4 mmol/L (ref 3.5–5.1)
Potassium: 5.3 mmol/L — ABNORMAL HIGH (ref 3.5–5.1)
Sodium: 137 mmol/L (ref 135–145)
Sodium: 140 mmol/L (ref 135–145)
Sodium: 135 mmol/L (ref 135–145)
Sodium: 136 mmol/L (ref 135–145)
Sodium: 137 mmol/L (ref 135–145)
TCO2: 24 mmol/L (ref 22–32)
TCO2: 24 mmol/L (ref 22–32)
TCO2: 22 mmol/L (ref 22–32)
TCO2: 21 mmol/L — ABNORMAL LOW (ref 22–32)
TCO2: 23 mmol/L (ref 22–32)

## 2021-10-12 LAB — CBC
HCT: 37.1 % — ABNORMAL LOW (ref 39.0–52.0)
HCT: 34.9 % — ABNORMAL LOW (ref 39.0–52.0)
Hemoglobin: 11.9 g/dL — ABNORMAL LOW (ref 13.0–17.0)
Hemoglobin: 11.2 g/dL — ABNORMAL LOW (ref 13.0–17.0)
MCH: 28.7 pg (ref 26.0–34.0)
MCH: 28.8 pg (ref 26.0–34.0)
MCHC: 32.1 g/dL (ref 30.0–36.0)
MCHC: 32.1 g/dL (ref 30.0–36.0)
MCV: 89.6 fL (ref 80.0–100.0)
MCV: 89.7 fL (ref 80.0–100.0)
Platelets: 149 10*3/uL — ABNORMAL LOW (ref 150–400)
Platelets: 143 10*3/uL — ABNORMAL LOW (ref 150–400)
RBC: 4.14 MIL/uL — ABNORMAL LOW (ref 4.22–5.81)
RBC: 3.89 MIL/uL — ABNORMAL LOW (ref 4.22–5.81)
RDW: 12.8 % (ref 11.5–15.5)
RDW: 13 % (ref 11.5–15.5)
WBC: 10.8 10*3/uL — ABNORMAL HIGH (ref 4.0–10.5)
WBC: 10.3 10*3/uL (ref 4.0–10.5)
nRBC: 0 % (ref 0.0–0.2)
nRBC: 0 % (ref 0.0–0.2)

## 2021-10-12 LAB — PLATELET COUNT: Platelets: 154 10*3/uL (ref 150–400)

## 2021-10-12 LAB — ABO/RH: ABO/RH(D): O POS

## 2021-10-12 LAB — POCT I-STAT EG7
Acid-base deficit: 2 mmol/L (ref 0.0–2.0)
Bicarbonate: 24.2 mmol/L (ref 20.0–28.0)
Calcium, Ion: 1.2 mmol/L (ref 1.15–1.40)
HCT: 31 % — ABNORMAL LOW (ref 39.0–52.0)
Hemoglobin: 10.5 g/dL — ABNORMAL LOW (ref 13.0–17.0)
O2 Saturation: 78 %
Potassium: 4.6 mmol/L (ref 3.5–5.1)
Sodium: 140 mmol/L (ref 135–145)
TCO2: 26 mmol/L (ref 22–32)
pCO2, Ven: 44.4 mmHg (ref 44–60)
pH, Ven: 7.345 (ref 7.25–7.43)
pO2, Ven: 45 mmHg (ref 32–45)

## 2021-10-12 LAB — PROTIME-INR
INR: 1.3 — ABNORMAL HIGH (ref 0.8–1.2)
Prothrombin Time: 16.1 seconds — ABNORMAL HIGH (ref 11.4–15.2)

## 2021-10-12 LAB — APTT: aPTT: 29 seconds (ref 24–36)

## 2021-10-12 LAB — MAGNESIUM: Magnesium: 3.3 mg/dL — ABNORMAL HIGH (ref 1.7–2.4)

## 2021-10-12 LAB — HEMOGLOBIN AND HEMATOCRIT, BLOOD
HCT: 30.6 % — ABNORMAL LOW (ref 39.0–52.0)
Hemoglobin: 9.9 g/dL — ABNORMAL LOW (ref 13.0–17.0)

## 2021-10-12 LAB — GLUCOSE, CAPILLARY
Glucose-Capillary: 178 mg/dL — ABNORMAL HIGH (ref 70–99)
Glucose-Capillary: 144 mg/dL — ABNORMAL HIGH (ref 70–99)
Glucose-Capillary: 139 mg/dL — ABNORMAL HIGH (ref 70–99)
Glucose-Capillary: 122 mg/dL — ABNORMAL HIGH (ref 70–99)
Glucose-Capillary: 127 mg/dL — ABNORMAL HIGH (ref 70–99)
Glucose-Capillary: 128 mg/dL — ABNORMAL HIGH (ref 70–99)
Glucose-Capillary: 124 mg/dL — ABNORMAL HIGH (ref 70–99)
Glucose-Capillary: 149 mg/dL — ABNORMAL HIGH (ref 70–99)
Glucose-Capillary: 149 mg/dL — ABNORMAL HIGH (ref 70–99)

## 2021-10-12 LAB — ECHO INTRAOPERATIVE TEE
Area-P 1/2: 4.17 cm2
Height: 67 in
Weight: 2880 oz

## 2021-10-12 LAB — SARS CORONAVIRUS 2 (TAT 6-24 HRS): SARS Coronavirus 2: NEGATIVE

## 2021-10-12 SURGERY — CORONARY ARTERY BYPASS GRAFTING (CABG)
Anesthesia: General | Site: Chest

## 2021-10-12 MED ORDER — MIDAZOLAM HCL (PF) 10 MG/2ML IJ SOLN
INTRAMUSCULAR | Status: AC
  Filled 2021-10-12: qty 2

## 2021-10-12 MED ORDER — FENTANYL CITRATE (PF) 250 MCG/5ML IJ SOLN
INTRAMUSCULAR | Status: AC
  Filled 2021-10-12: qty 5

## 2021-10-12 MED ORDER — PROTAMINE SULFATE 10 MG/ML IV SOLN
INTRAVENOUS | Status: AC
  Filled 2021-10-12: qty 25

## 2021-10-12 MED ORDER — VASOPRESSIN 20 UNIT/ML IV SOLN
INTRAVENOUS | Status: DC | PRN
  Administered 2021-10-12 (×2): 1 [IU] via INTRAVENOUS

## 2021-10-12 MED ORDER — DOCUSATE SODIUM 100 MG PO CAPS
200.0000 mg | ORAL_CAPSULE | Freq: Every day | ORAL | Status: DC
  Administered 2021-10-13 – 2021-10-17 (×5): 200 mg via ORAL
  Filled 2021-10-12 (×5): qty 2

## 2021-10-12 MED ORDER — INSULIN ASPART 100 UNIT/ML IJ SOLN
INTRAMUSCULAR | Status: AC
  Filled 2021-10-12: qty 1

## 2021-10-12 MED ORDER — POTASSIUM CHLORIDE 10 MEQ/50ML IV SOLN
10.0000 meq | INTRAVENOUS | Status: AC
  Filled 2021-10-12: qty 50

## 2021-10-12 MED ORDER — CHLORHEXIDINE GLUCONATE 0.12 % MT SOLN: 15.0000 mL | Freq: Once | OROMUCOSAL | Status: AC

## 2021-10-12 MED ORDER — NITROGLYCERIN IN D5W 200-5 MCG/ML-% IV SOLN: 7.0000 ug/min | INTRAVENOUS | Status: DC

## 2021-10-12 MED ORDER — ACETAMINOPHEN 500 MG PO TABS
1000.0000 mg | ORAL_TABLET | Freq: Four times a day (QID) | ORAL | Status: DC
  Administered 2021-10-12 – 2021-10-17 (×17): 1000 mg via ORAL
  Filled 2021-10-12 (×17): qty 2

## 2021-10-12 MED ORDER — PHENYLEPHRINE 80 MCG/ML (10ML) SYRINGE FOR IV PUSH (FOR BLOOD PRESSURE SUPPORT)
PREFILLED_SYRINGE | INTRAVENOUS | Status: DC | PRN
  Administered 2021-10-12 (×5): 80 ug via INTRAVENOUS

## 2021-10-12 MED ORDER — BISACODYL 10 MG RE SUPP: 10.0000 mg | Freq: Every day | RECTAL | Status: DC

## 2021-10-12 MED ORDER — SODIUM CHLORIDE 0.9 % IV SOLN: INTRAVENOUS | Status: DC

## 2021-10-12 MED ORDER — CHLORHEXIDINE GLUCONATE 4 % EX LIQD: 30.0000 mL | CUTANEOUS | Status: DC

## 2021-10-12 MED ORDER — METOPROLOL TARTRATE 12.5 MG HALF TABLET: 12.5000 mg | ORAL_TABLET | Freq: Once | ORAL | Status: DC

## 2021-10-12 MED ORDER — ASPIRIN 81 MG PO TBEC
81.0000 mg | DELAYED_RELEASE_TABLET | Freq: Every day | ORAL | Status: DC
  Administered 2021-10-13 – 2021-10-17 (×5): 81 mg via ORAL
  Filled 2021-10-12 (×5): qty 1

## 2021-10-12 MED ORDER — CHLORHEXIDINE GLUCONATE 0.12 % MT SOLN
OROMUCOSAL | Status: AC
  Administered 2021-10-12: 15 mL via OROMUCOSAL
  Filled 2021-10-12: qty 15

## 2021-10-12 MED ORDER — VANCOMYCIN HCL IN DEXTROSE 1-5 GM/200ML-% IV SOLN
1000.0000 mg | Freq: Once | INTRAVENOUS | Status: AC
  Administered 2021-10-12: 1000 mg via INTRAVENOUS
  Filled 2021-10-12: qty 200

## 2021-10-12 MED ORDER — SUCCINYLCHOLINE CHLORIDE 200 MG/10ML IV SOSY
PREFILLED_SYRINGE | INTRAVENOUS | Status: DC | PRN
  Administered 2021-10-12: 120 mg via INTRAVENOUS

## 2021-10-12 MED ORDER — NICARDIPINE HCL IN NACL 20-0.86 MG/200ML-% IV SOLN: 0.0000 mg/h | INTRAVENOUS | Status: DC

## 2021-10-12 MED ORDER — PROTAMINE SULFATE 10 MG/ML IV SOLN
INTRAVENOUS | Status: DC | PRN
  Administered 2021-10-12: 50 mg via INTRAVENOUS
  Administered 2021-10-12: 70 mg via INTRAVENOUS
  Administered 2021-10-12: 50 mg via INTRAVENOUS
  Administered 2021-10-12: 40 mg via INTRAVENOUS
  Administered 2021-10-12: 50 mg via INTRAVENOUS
  Administered 2021-10-12: 20 mg via INTRAVENOUS

## 2021-10-12 MED ORDER — MAGNESIUM SULFATE 4 GM/100ML IV SOLN
4.0000 g | Freq: Once | INTRAVENOUS | Status: AC
  Administered 2021-10-12: 4 g via INTRAVENOUS
  Filled 2021-10-12: qty 100

## 2021-10-12 MED ORDER — SODIUM BICARBONATE 8.4 % IV SOLN
100.0000 meq | Freq: Once | INTRAVENOUS | Status: AC
  Administered 2021-10-12: 100 meq via INTRAVENOUS

## 2021-10-12 MED ORDER — METOPROLOL TARTRATE 5 MG/5ML IV SOLN: 2.5000 mg | INTRAVENOUS | Status: DC | PRN

## 2021-10-12 MED ORDER — VASOPRESSIN 20 UNIT/ML IV SOLN
INTRAVENOUS | Status: AC
  Filled 2021-10-12: qty 1

## 2021-10-12 MED ORDER — ACETAMINOPHEN 160 MG/5ML PO SOLN: 1000.0000 mg | Freq: Four times a day (QID) | ORAL | Status: DC

## 2021-10-12 MED ORDER — ACETAMINOPHEN 160 MG/5ML PO SOLN: 650.0000 mg | Freq: Once | ORAL | Status: AC

## 2021-10-12 MED ORDER — AMLODIPINE BESYLATE 5 MG PO TABS
2.5000 mg | ORAL_TABLET | Freq: Every day | ORAL | Status: DC
  Administered 2021-10-13 – 2021-10-17 (×5): 2.5 mg via ORAL
  Filled 2021-10-12 (×5): qty 1

## 2021-10-12 MED ORDER — MORPHINE SULFATE (PF) 2 MG/ML IV SOLN
1.0000 mg | INTRAVENOUS | Status: DC | PRN
  Administered 2021-10-12 (×2): 4 mg via INTRAVENOUS
  Administered 2021-10-12 – 2021-10-14 (×4): 2 mg via INTRAVENOUS
  Filled 2021-10-12: qty 2
  Filled 2021-10-12 (×3): qty 1
  Filled 2021-10-12: qty 2
  Filled 2021-10-12: qty 1

## 2021-10-12 MED ORDER — TRAMADOL HCL 50 MG PO TABS
50.0000 mg | ORAL_TABLET | ORAL | Status: DC | PRN
  Administered 2021-10-13: 100 mg via ORAL
  Filled 2021-10-12: qty 2

## 2021-10-12 MED ORDER — PANTOPRAZOLE SODIUM 40 MG PO TBEC
40.0000 mg | DELAYED_RELEASE_TABLET | Freq: Every day | ORAL | Status: DC
  Administered 2021-10-14 – 2021-10-17 (×4): 40 mg via ORAL
  Filled 2021-10-12 (×4): qty 1

## 2021-10-12 MED ORDER — DEXTROSE 50 % IV SOLN: 0.0000 mL | INTRAVENOUS | Status: DC | PRN

## 2021-10-12 MED ORDER — CHLORHEXIDINE GLUCONATE CLOTH 2 % EX PADS
6.0000 | MEDICATED_PAD | Freq: Every day | CUTANEOUS | Status: DC
Start: 2021-10-12 — End: 2021-10-17
  Administered 2021-10-12 – 2021-10-15 (×4): 6 via TOPICAL

## 2021-10-12 MED ORDER — MIDAZOLAM HCL 2 MG/2ML IJ SOLN: 2.0000 mg | INTRAMUSCULAR | Status: DC | PRN

## 2021-10-12 MED ORDER — MIDAZOLAM HCL 5 MG/5ML IJ SOLN
INTRAMUSCULAR | Status: DC | PRN
  Administered 2021-10-12 (×2): 2 mg via INTRAVENOUS
  Administered 2021-10-12: 3 mg via INTRAVENOUS

## 2021-10-12 MED ORDER — INSULIN ASPART 100 UNIT/ML IJ SOLN
0.0000 [IU] | INTRAMUSCULAR | Status: DC | PRN
  Administered 2021-10-12: 2 [IU] via SUBCUTANEOUS

## 2021-10-12 MED ORDER — FENTANYL CITRATE (PF) 100 MCG/2ML IJ SOLN
INTRAMUSCULAR | Status: DC | PRN
  Administered 2021-10-12 (×2): 50 ug via INTRAVENOUS
  Administered 2021-10-12: 100 ug via INTRAVENOUS
  Administered 2021-10-12: 50 ug via INTRAVENOUS
  Administered 2021-10-12: 100 ug via INTRAVENOUS
  Administered 2021-10-12: 50 ug via INTRAVENOUS
  Administered 2021-10-12: 450 ug via INTRAVENOUS
  Administered 2021-10-12: 100 ug via INTRAVENOUS
  Administered 2021-10-12 (×2): 50 ug via INTRAVENOUS
  Administered 2021-10-12 (×2): 100 ug via INTRAVENOUS

## 2021-10-12 MED ORDER — OXYCODONE HCL 5 MG PO TABS
5.0000 mg | ORAL_TABLET | ORAL | Status: DC | PRN
  Administered 2021-10-12 – 2021-10-17 (×9): 10 mg via ORAL
  Filled 2021-10-12 (×10): qty 2

## 2021-10-12 MED ORDER — LACTATED RINGERS IV SOLN: INTRAVENOUS | Status: DC

## 2021-10-12 MED ORDER — PLASMA-LYTE A IV SOLN
INTRAVENOUS | Status: DC | PRN
  Administered 2021-10-12: 500 mL

## 2021-10-12 MED ORDER — PROPOFOL 10 MG/ML IV BOLUS
INTRAVENOUS | Status: DC | PRN
  Administered 2021-10-12: 30 mg via INTRAVENOUS
  Administered 2021-10-12: 20 mg via INTRAVENOUS
  Administered 2021-10-12: 50 mg via INTRAVENOUS
  Administered 2021-10-12: 100 mg via INTRAVENOUS

## 2021-10-12 MED ORDER — PHENYLEPHRINE HCL-NACL 20-0.9 MG/250ML-% IV SOLN: 0.0000 ug/min | INTRAVENOUS | Status: DC

## 2021-10-12 MED ORDER — 0.9 % SODIUM CHLORIDE (POUR BTL) OPTIME
TOPICAL | Status: DC | PRN
  Administered 2021-10-12: 3000 mL

## 2021-10-12 MED ORDER — METOPROLOL TARTRATE 12.5 MG HALF TABLET
12.5000 mg | ORAL_TABLET | Freq: Two times a day (BID) | ORAL | Status: DC
  Administered 2021-10-13 – 2021-10-15 (×6): 12.5 mg via ORAL
  Filled 2021-10-12 (×6): qty 1

## 2021-10-12 MED ORDER — HEPARIN SODIUM (PORCINE) 1000 UNIT/ML IJ SOLN
INTRAMUSCULAR | Status: DC | PRN
  Administered 2021-10-12: 28000 [IU] via INTRAVENOUS

## 2021-10-12 MED ORDER — EZETIMIBE 10 MG PO TABS
10.0000 mg | ORAL_TABLET | Freq: Every evening | ORAL | Status: DC
  Administered 2021-10-12 – 2021-10-16 (×5): 10 mg via ORAL
  Filled 2021-10-12 (×6): qty 1

## 2021-10-12 MED ORDER — METOPROLOL TARTRATE 25 MG/10 ML ORAL SUSPENSION
12.5000 mg | Freq: Two times a day (BID) | ORAL | Status: DC
  Administered 2021-10-12: 12.5 mg
  Filled 2021-10-12: qty 10

## 2021-10-12 MED ORDER — LACTATED RINGERS IV SOLN: INTRAVENOUS | Status: DC | PRN

## 2021-10-12 MED ORDER — FAMOTIDINE IN NACL 20-0.9 MG/50ML-% IV SOLN
20.0000 mg | Freq: Two times a day (BID) | INTRAVENOUS | Status: AC
  Administered 2021-10-12 (×2): 20 mg via INTRAVENOUS
  Filled 2021-10-12 (×2): qty 50

## 2021-10-12 MED ORDER — CHLORHEXIDINE GLUCONATE 0.12 % MT SOLN: 15.0000 mL | Freq: Once | OROMUCOSAL | Status: DC

## 2021-10-12 MED ORDER — ACETAMINOPHEN 500 MG PO TABS: 1000.0000 mg | ORAL_TABLET | Freq: Once | ORAL | Status: AC

## 2021-10-12 MED ORDER — ATORVASTATIN CALCIUM 80 MG PO TABS
80.0000 mg | ORAL_TABLET | Freq: Every day | ORAL | Status: DC
  Administered 2021-10-13 – 2021-10-17 (×5): 80 mg via ORAL
  Filled 2021-10-12 (×5): qty 1

## 2021-10-12 MED ORDER — ALBUMIN HUMAN 5 % IV SOLN
250.0000 mL | INTRAVENOUS | Status: AC | PRN
  Administered 2021-10-12 (×4): 12.5 g via INTRAVENOUS
  Filled 2021-10-12: qty 250

## 2021-10-12 MED ORDER — SODIUM CHLORIDE 0.9% FLUSH
3.0000 mL | Freq: Two times a day (BID) | INTRAVENOUS | Status: DC
  Administered 2021-10-13 – 2021-10-15 (×3): 3 mL via INTRAVENOUS

## 2021-10-12 MED ORDER — INSULIN REGULAR(HUMAN) IN NACL 100-0.9 UT/100ML-% IV SOLN: INTRAVENOUS | Status: DC

## 2021-10-12 MED ORDER — CHLORHEXIDINE GLUCONATE 0.12 % MT SOLN
15.0000 mL | OROMUCOSAL | Status: AC
  Administered 2021-10-12: 15 mL via OROMUCOSAL
  Filled 2021-10-12: qty 15

## 2021-10-12 MED ORDER — CLOPIDOGREL BISULFATE 75 MG PO TABS
75.0000 mg | ORAL_TABLET | Freq: Every day | ORAL | Status: DC
  Administered 2021-10-12 – 2021-10-17 (×6): 75 mg via ORAL
  Filled 2021-10-12 (×6): qty 1

## 2021-10-12 MED ORDER — CEFAZOLIN SODIUM-DEXTROSE 2-4 GM/100ML-% IV SOLN
2.0000 g | Freq: Three times a day (TID) | INTRAVENOUS | Status: AC
  Administered 2021-10-12 – 2021-10-14 (×6): 2 g via INTRAVENOUS
  Filled 2021-10-12 (×6): qty 100

## 2021-10-12 MED ORDER — HEMOSTATIC AGENTS (NO CHARGE) OPTIME
TOPICAL | Status: DC | PRN
  Administered 2021-10-12: 1 via TOPICAL

## 2021-10-12 MED ORDER — BISACODYL 5 MG PO TBEC
10.0000 mg | DELAYED_RELEASE_TABLET | Freq: Every day | ORAL | Status: DC
  Administered 2021-10-13 – 2021-10-17 (×5): 10 mg via ORAL
  Filled 2021-10-12 (×5): qty 2

## 2021-10-12 MED ORDER — DEXMEDETOMIDINE HCL IN NACL 400 MCG/100ML IV SOLN
0.0000 ug/kg/h | INTRAVENOUS | Status: DC
  Administered 2021-10-13: 0.1 ug/kg/h via INTRAVENOUS
  Filled 2021-10-12: qty 100

## 2021-10-12 MED ORDER — PROPOFOL 10 MG/ML IV BOLUS
INTRAVENOUS | Status: AC
  Filled 2021-10-12: qty 20

## 2021-10-12 MED ORDER — ORAL CARE MOUTH RINSE: 15.0000 mL | Freq: Once | OROMUCOSAL | Status: AC

## 2021-10-12 MED ORDER — SODIUM CHLORIDE 0.9 % IV SOLN
250.0000 mL | INTRAVENOUS | Status: DC
Start: 2021-10-13 — End: 2021-10-17

## 2021-10-12 MED ORDER — LACTATED RINGERS IV SOLN: 500.0000 mL | Freq: Once | INTRAVENOUS | Status: DC | PRN

## 2021-10-12 MED ORDER — FENOFIBRATE 160 MG PO TABS
160.0000 mg | ORAL_TABLET | Freq: Every day | ORAL | Status: DC
  Administered 2021-10-13 – 2021-10-17 (×5): 160 mg via ORAL
  Filled 2021-10-12 (×5): qty 1

## 2021-10-12 MED ORDER — SODIUM CHLORIDE 0.45 % IV SOLN: INTRAVENOUS | Status: DC | PRN

## 2021-10-12 MED ORDER — ACETAMINOPHEN 650 MG RE SUPP
650.0000 mg | Freq: Once | RECTAL | Status: AC
  Administered 2021-10-12: 650 mg via RECTAL

## 2021-10-12 MED ORDER — SODIUM CHLORIDE 0.9% FLUSH: 3.0000 mL | INTRAVENOUS | Status: DC | PRN

## 2021-10-12 MED ORDER — ROCURONIUM BROMIDE 10 MG/ML (PF) SYRINGE
PREFILLED_SYRINGE | INTRAVENOUS | Status: DC | PRN
  Administered 2021-10-12: 50 mg via INTRAVENOUS
  Administered 2021-10-12: 20 mg via INTRAVENOUS
  Administered 2021-10-12 (×4): 50 mg via INTRAVENOUS

## 2021-10-12 MED ORDER — ONDANSETRON HCL 4 MG/2ML IJ SOLN
4.0000 mg | Freq: Four times a day (QID) | INTRAMUSCULAR | Status: DC | PRN
  Administered 2021-10-13: 4 mg via INTRAVENOUS
  Filled 2021-10-12: qty 2

## 2021-10-12 MED ORDER — ONDANSETRON HCL 4 MG/2ML IJ SOLN
INTRAMUSCULAR | Status: DC | PRN
  Administered 2021-10-12: 4 mg via INTRAVENOUS

## 2021-10-12 MED ORDER — ASPIRIN 81 MG PO CHEW: 81.0000 mg | CHEWABLE_TABLET | Freq: Every day | ORAL | Status: DC

## 2021-10-12 MED ORDER — ~~LOC~~ CARDIAC SURGERY, PATIENT & FAMILY EDUCATION
Freq: Once | Status: DC
  Filled 2021-10-12: qty 1

## 2021-10-12 MED ORDER — PROTAMINE SULFATE 10 MG/ML IV SOLN
INTRAVENOUS | Status: AC
  Filled 2021-10-12: qty 5

## 2021-10-12 MED ORDER — ACETAMINOPHEN 500 MG PO TABS
ORAL_TABLET | ORAL | Status: AC
  Administered 2021-10-12: 1000 mg via ORAL
  Filled 2021-10-12: qty 2

## 2021-10-12 SURGICAL SUPPLY — 116 items
ADH SKN CLS APL DERMABOND .7 (GAUZE/BANDAGES/DRESSINGS) ×6
APPLIER CLIP 9.375 SM OPEN (CLIP) ×4
APR CLP SM 9.3 20 MLT OPN (CLIP) ×3
BAG DECANTER FOR FLEXI CONT (MISCELLANEOUS) ×4 IMPLANT
BLADE CLIPPER SURG (BLADE) ×8 IMPLANT
BLADE STERNUM SYSTEM 6 (BLADE) ×4 IMPLANT
BLADE SURG 11 STRL SS (BLADE) ×1 IMPLANT
BLADE SURG 15 STRL LF DISP TIS (BLADE) ×3 IMPLANT
BLADE SURG 15 STRL SS (BLADE) ×4
BNDG ELASTIC 4X5.8 VLCR STR LF (GAUZE/BANDAGES/DRESSINGS) ×8 IMPLANT
BNDG ELASTIC 6X5.8 VLCR STR LF (GAUZE/BANDAGES/DRESSINGS) ×5 IMPLANT
BNDG GAUZE DERMACEA FLUFF (GAUZE/BANDAGES/DRESSINGS) ×3
BNDG GAUZE DERMACEA FLUFF 4 (GAUZE/BANDAGES/DRESSINGS) IMPLANT
BNDG GAUZE ELAST 4 BULKY (GAUZE/BANDAGES/DRESSINGS) ×4 IMPLANT
BNDG GZE DERMACEA 4 6PLY (GAUZE/BANDAGES/DRESSINGS) ×9
CABLE SURGICAL S-101-97-12 (CABLE) ×4 IMPLANT
CANISTER SUCT 3000ML PPV (MISCELLANEOUS) ×4 IMPLANT
CANNULA MC2 2 STG 29/37 NON-V (CANNULA) ×3 IMPLANT
CANNULA MC2 TWO STAGE (CANNULA) ×8
CANNULA NON VENT 20FR 12 (CANNULA) ×4 IMPLANT
CANNULA NON VENT 22FR 12 (CANNULA) ×1 IMPLANT
CATH CPB KIT OWEN (MISCELLANEOUS) ×1 IMPLANT
CATH ROBINSON RED A/P 18FR (CATHETERS) ×8 IMPLANT
CLIP APPLIE 9.375 SM OPEN (CLIP) ×3 IMPLANT
CLIP FOGARTY SPRING 6M (CLIP) ×1 IMPLANT
CLIP RETRACTION 3.0MM CORONARY (MISCELLANEOUS) ×4 IMPLANT
CLIP VESOCCLUDE MED 24/CT (CLIP) IMPLANT
CLIP VESOCCLUDE SM WIDE 24/CT (CLIP) IMPLANT
CNTNR URN SCR LID CUP LEK RST (MISCELLANEOUS) IMPLANT
CONN ST 1/2X1/2  BEN (MISCELLANEOUS) ×4
CONN ST 1/2X1/2 BEN (MISCELLANEOUS) ×3 IMPLANT
CONNECTOR BLAKE 2:1 CARIO BLK (MISCELLANEOUS) ×4 IMPLANT
CONT SPEC 4OZ STRL OR WHT (MISCELLANEOUS) ×16
CONTAINER PROTECT SURGISLUSH (MISCELLANEOUS) ×8 IMPLANT
COVER MAYO STAND STRL (DRAPES) ×5 IMPLANT
COVER PROBE W GEL 5X96 (DRAPES) ×2 IMPLANT
CUFF TOURN SGL QUICK 18X4 (TOURNIQUET CUFF) IMPLANT
CUFF TOURN SGL QUICK 24 (TOURNIQUET CUFF)
CUFF TRNQT CYL 24X4X16.5-23 (TOURNIQUET CUFF) IMPLANT
DERMABOND ADVANCED (GAUZE/BANDAGES/DRESSINGS) ×2
DERMABOND ADVANCED .7 DNX12 (GAUZE/BANDAGES/DRESSINGS) IMPLANT
DRAIN CHANNEL 19F RND (DRAIN) ×12 IMPLANT
DRAIN CONNECTOR BLAKE 1:1 (MISCELLANEOUS) ×4 IMPLANT
DRAPE CARDIOVASCULAR INCISE (DRAPES) ×4
DRAPE EXTREMITY T 121X128X90 (DISPOSABLE) ×5 IMPLANT
DRAPE HALF SHEET 40X57 (DRAPES) ×6 IMPLANT
DRAPE INCISE IOBAN 66X45 STRL (DRAPES) IMPLANT
DRAPE SRG 135X102X78XABS (DRAPES) ×3 IMPLANT
DRAPE UTILITY W/TAPE 26X15 (DRAPES) ×1 IMPLANT
DRAPE WARM FLUID 44X44 (DRAPES) ×4 IMPLANT
DRSG AQUACEL AG ADV 3.5X10 (GAUZE/BANDAGES/DRESSINGS) ×5 IMPLANT
DRSG AQUACEL AG ADV 3.5X14 (GAUZE/BANDAGES/DRESSINGS) ×4 IMPLANT
DRSG COVADERM 4X14 (GAUZE/BANDAGES/DRESSINGS) ×4 IMPLANT
ELECT BLADE 4.0 EZ CLEAN MEGAD (MISCELLANEOUS) ×8
ELECT REM PT RETURN 9FT ADLT (ELECTROSURGICAL) ×8
ELECTRODE BLDE 4.0 EZ CLN MEGD (MISCELLANEOUS) ×3 IMPLANT
ELECTRODE REM PT RTRN 9FT ADLT (ELECTROSURGICAL) ×6 IMPLANT
FELT TEFLON 1X6 (MISCELLANEOUS) ×8 IMPLANT
GAUZE 4X4 16PLY ~~LOC~~+RFID DBL (SPONGE) ×8 IMPLANT
GAUZE SPONGE 4X4 12PLY STRL (GAUZE/BANDAGES/DRESSINGS) ×10 IMPLANT
GEL ULTRASOUND 20GR AQUASONIC (MISCELLANEOUS) ×4 IMPLANT
GLOVE BIO SURGEON STRL SZ 6 (GLOVE) ×2 IMPLANT
GLOVE BIO SURGEON STRL SZ 6.5 (GLOVE) ×5 IMPLANT
GLOVE BIO SURGEON STRL SZ7 (GLOVE) ×8 IMPLANT
GLOVE BIOGEL M STRL SZ7.5 (GLOVE) ×8 IMPLANT
GLOVE BIOGEL PI IND STRL 7.0 (GLOVE) IMPLANT
GLOVE BIOGEL PI INDICATOR 7.0 (GLOVE) ×2
GOWN STRL REUS W/ TWL LRG LVL3 (GOWN DISPOSABLE) ×12 IMPLANT
GOWN STRL REUS W/ TWL XL LVL3 (GOWN DISPOSABLE) ×6 IMPLANT
GOWN STRL REUS W/TWL LRG LVL3 (GOWN DISPOSABLE) ×16
GOWN STRL REUS W/TWL XL LVL3 (GOWN DISPOSABLE) ×8
HEMOSTAT POWDER SURGIFOAM 1G (HEMOSTASIS) ×12 IMPLANT
INSERT SUTURE HOLDER (MISCELLANEOUS) ×4 IMPLANT
KIT BASIN OR (CUSTOM PROCEDURE TRAY) ×4 IMPLANT
KIT SUCTION CATH 14FR (SUCTIONS) ×4 IMPLANT
KIT TURNOVER KIT B (KITS) ×8 IMPLANT
KIT VASOVIEW HEMOPRO 2 VH 4000 (KITS) ×4 IMPLANT
LEAD PACING MYOCARDI (MISCELLANEOUS) ×4 IMPLANT
MARKER GRAFT CORONARY BYPASS (MISCELLANEOUS) ×12 IMPLANT
NS IRRIG 1000ML POUR BTL (IV SOLUTION) ×20 IMPLANT
PACK ACCESSORY CANNULA KIT (KITS) ×4 IMPLANT
PACK E OPEN HEART (SUTURE) ×4 IMPLANT
PACK OPEN HEART (CUSTOM PROCEDURE TRAY) ×4 IMPLANT
PAD ARMBOARD 7.5X6 YLW CONV (MISCELLANEOUS) ×16 IMPLANT
PAD ELECT DEFIB RADIOL ZOLL (MISCELLANEOUS) ×4 IMPLANT
PENCIL BUTTON HOLSTER BLD 10FT (ELECTRODE) ×4 IMPLANT
POSITIONER HEAD DONUT 9IN (MISCELLANEOUS) ×4 IMPLANT
PUNCH AORTIC ROTATE 4.0MM (MISCELLANEOUS) ×4 IMPLANT
SET MPS 3-ND DEL (MISCELLANEOUS) ×1 IMPLANT
SHEARS HARMONIC 9CM CVD (BLADE) ×5 IMPLANT
SPONGE T-LAP 18X18 ~~LOC~~+RFID (SPONGE) ×30 IMPLANT
SUPPORT HEART JANKE-BARRON (MISCELLANEOUS) ×4 IMPLANT
SUT BONE WAX W31G (SUTURE) ×4 IMPLANT
SUT ETHIBOND X763 2 0 SH 1 (SUTURE) ×9 IMPLANT
SUT MNCRL AB 3-0 PS2 18 (SUTURE) ×10 IMPLANT
SUT MNCRL AB 4-0 PS2 18 (SUTURE) ×1 IMPLANT
SUT PDS AB 1 CTX 36 (SUTURE) ×8 IMPLANT
SUT PROLENE 4 0 SH DA (SUTURE) ×4 IMPLANT
SUT PROLENE 5 0 C 1 36 (SUTURE) ×12 IMPLANT
SUT PROLENE 7 0 BV 1 (SUTURE) ×3 IMPLANT
SUT PROLENE 7 0 BV1 MDA (SUTURE) ×4 IMPLANT
SUT STEEL 6MS V (SUTURE) ×8 IMPLANT
SUT VIC AB 2-0 CT1 27 (SUTURE) ×4
SUT VIC AB 2-0 CT1 TAPERPNT 27 (SUTURE) IMPLANT
SUT VIC AB 3-0 SH 27 (SUTURE)
SUT VIC AB 3-0 SH 27X BRD (SUTURE) IMPLANT
SUT VIC AB 3-0 X1 27 (SUTURE) IMPLANT
SYR 50ML SLIP (SYRINGE) IMPLANT
SYSTEM SAHARA CHEST DRAIN ATS (WOUND CARE) ×4 IMPLANT
TAPE CLOTH SURG 6X10 WHT LF (GAUZE/BANDAGES/DRESSINGS) ×1 IMPLANT
TOWEL GREEN STERILE (TOWEL DISPOSABLE) ×8 IMPLANT
TOWEL GREEN STERILE FF (TOWEL DISPOSABLE) ×8 IMPLANT
TRAY FOLEY SLVR 16FR TEMP STAT (SET/KITS/TRAYS/PACK) ×4 IMPLANT
TUBING LAP HI FLOW INSUFFLATIO (TUBING) ×4 IMPLANT
UNDERPAD 30X36 HEAVY ABSORB (UNDERPADS AND DIAPERS) ×8 IMPLANT
WATER STERILE IRR 1000ML POUR (IV SOLUTION) ×8 IMPLANT

## 2021-10-12 NOTE — Hospital Course (Signed)
HPI: This is a 66 year old male with a history of coronary artery disease presents for surgical evaluation.   He was being worked up for spinal surgery, and on evaluation he was noted to have anginal symptoms. He recently underwent a left heart cath which showed progression of three-vessel coronary disease.  He also has in-stent restenosis of his LAD stent.  He does remain active but has dyspnea on exertion.  He denies any rest pains.  Dr. Cliffton Asters discussed the need for coronary artery bypass grafting surgery. Potential risks, benefits, and complications of the surgery were discussed with the patient and he agreed to proceed with surgery. Pre operative carotid duplex US showed no significant internal carotid artery stenosis bilaterally.   Hospital Course: Patient underwent a CABG x 3. He was transported from the OR to [facility] ICU in stable condition.

## 2021-10-12 NOTE — Telephone Encounter (Signed)
Pharmacy Patient Advocate Encounter  Insurance verification completed.    The patient is insured through [employer]   The patient is currently admitted and ran test claims for the following: Farxiga, Jardiance.  Copays and coinsurance results were relayed to Inpatient clinical team.

## 2021-10-12 NOTE — Transfer of Care (Signed)
Immediate Anesthesia Transfer of Care Note  Patient: Demoni L Verstraete  Procedure(s) Performed: CORONARY ARTERY BYPASS GRAFTING (CABG) x3 USING LEFT INTERNAL MAMMARY ARTERY AND LEFT RADIAL ARM GRAFT (Chest) RADIAL ARTERY HARVEST (Left: Arm Lower) TRANSESOPHAGEAL ECHOCARDIOGRAM (TEE)  Patient Location: ICU  Anesthesia Type:General  Level of Consciousness: Patient remains intubated per anesthesia plan  Airway & Oxygen Therapy: Patient remains intubated per anesthesia plan and Patient placed on Ventilator (see vital sign flow sheet for setting)  Post-op Assessment: Report given to RN and Post -op Vital signs reviewed and stable  Post vital signs: Reviewed and stable  Last Vitals:  Vitals Value Taken Time  BP    Temp    Pulse 82 10/12/21 1315  Resp 12 10/12/21 1315  SpO2 98 % 10/12/21 1315  Vitals shown include unvalidated device data.  Last Pain:  Vitals:   10/12/21 0614  TempSrc:   PainSc: 0-No pain         Complications: No notable events documented.

## 2021-10-12 NOTE — TOC Benefit Eligibility Note (Signed)
Patient Product/process development scientist completed.    The patient is currently admitted and upon discharge could be taking Jardiance 10 mg.  The current 30 day co-pay is, $115.82.   The patient is currently admitted and upon discharge could be taking Farxiga 10 mg.  The current 30 day co-pay is, $115.82.   The patient is insured through [employer]     Roland Earl, CPhT Pharmacy Patient Advocate Specialist [facility] Patient Advocate Team Direct Number: [phone]  Fax: [phone]

## 2021-10-12 NOTE — Progress Notes (Signed)
  Echocardiogram Echocardiogram Transesophageal has been performed.  Leta Jungling M 10/12/2021, 8:26 AM

## 2021-10-12 NOTE — Discharge Instructions (Signed)

## 2021-10-12 NOTE — Op Note (Signed)
[address]       [place] [address]             [phone]                                          10/12/2021 Patient:  Mario Arnold Pre-Op Dx: 3V CAD DM HTN HLP CHF   Post-op Dx:  same Procedure: CABG X 3.  LIMA LAD, Left radial artery to ramus (Y graft off vein), RSVG to PDA   Endoscopic greater saphenous vein harvest on the right Open L radial artery harvest   Surgeon and Role:      * Abriel Geesey, Eliezer Lofts, MD - Primary    * Jacques Earthly, PA-C - assisting An experienced assistant was required given the complexity of this surgery and the standard of surgical care. The assistant was needed for exposure, dissection, suctioning, retraction of delicate tissues and sutures, instrument exchange and for overall help during this procedure.    Anesthesia  general EBL:  Blood Administration: none Xclamp Time:  48 min Pump Time:   Drains: 21 F blake drain: L, mediastinal  Wires: ventricular Counts: correct   Indications: 66 year old male with three-vessel coronary artery disease.  We discussed risks and benefits of surgical vascularization, and he is agreeable to proceed.  He will require grafting to the LAD, PDA and ramus intermedius.  The circumflex is a small vessel, and likely graftable.  We will also use his left radial artery as one of the conduits.  Findings: Thrombus in LAD.  Good radial, LIMA, and vein.  Calcified LAD.  Good PDA, and RI  Operative Technique: All invasive lines were placed in pre-op holding.  After the risks, benefits and alternatives were thoroughly discussed, the patient was brought to the operative theatre.  Anesthesia was induced, and the patient was prepped and draped in normal sterile fashion.  An appropriate surgical pause was performed, and pre-operative antibiotics were dosed accordingly.  We began with and incision along the left arm for harvesting of the radial artery.  We then made simultaneous incisions  along the right leg for harvesting of the greater saphenous vein and the chest for the sternotomy.  In regards to the sternotomy, this was carried down with bovie cautery, and the sternum was divided with a reciprocating saw.  Meticulous hemostasis was obtained.  The left internal thoracic artery was exposed and harvested in in pedicled fashion.  The patient was systemically heparinized, and the artery was divided distally, and placed in a papaverine sponge.    The sternal elevator was removed, and a retractor was placed.  The pericardium was divided in the midline and fashioned into a cradle with pericardial stitches.   After we confirmed an appropriate ACT, the ascending aorta was cannulated in standard fashion.  The right atrial appendage was used for venous cannulation site.  Cardiopulmonary bypass was initiated, and the heart retractor was placed. The cross clamp was applied, and a dose of anterograde cardioplegia was given with good arrest of the heart.  We moved to the posterior wall of the heart, and found a good target on the PDA.  An arteriotomy was made, and the vein graft was anastomosed to it in an end to side fashion.  Next we exposed the lateral wall, and found a good target on the ramus.  An end to side anastomosis with the vein graft was then created.  Finally, we exposed a good target on the  LAD, and fashioned an end to side anastomosis between it and the LITA.  We began to re-warm, and a re-animation dose of cardioplegia was given.  The heart was de-aired, and the cross clamp was removed.  Meticulous hemostasis was obtained.    A partial occludding clamp was then placed on the ascending aorta, and we created an end to side anastomosis between it and the proximal vein grafts.  Rings were placed on the proximal anastomosis.  Hemostasis was obtained, and we separated from cardiopulmonary bypass without event.  The heparin was reversed with protamine.  Chest tubes and wires were placed, and the  sternum was re-approximated with sternal wires.  The soft tissue and skin were re-approximated wth absorbable suture.    The patient tolerated the procedure without any immediate complications, and was transferred to the ICU in guarded condition.  Sumaiyah Markert Keane Scrape

## 2021-10-12 NOTE — Anesthesia Procedure Notes (Signed)
Arterial Line Insertion Start/End7/18/2023 6:55 AM, 10/12/2021 7:00 AM Performed by: Gaynelle Adu, MD, anesthesiologist  Patient location: Pre-op. Preanesthetic checklist: patient identified, IV checked, site marked, risks and benefits discussed, surgical consent, monitors and equipment checked, pre-op evaluation, timeout performed and anesthesia consent Lidocaine 1% used for infiltration Right, radial was placed Catheter size: 20 G Hand hygiene performed  and maximum sterile barriers used   Attempts: 1 Procedure performed using ultrasound guided technique. Ultrasound Notes:anatomy identified, needle tip was noted to be adjacent to the nerve/plexus identified, no ultrasound evidence of intravascular and/or intraneural injection and image(s) printed for medical record Following insertion, dressing applied and Biopatch. Post procedure assessment: normal and unchanged  Post procedure complications: unsuccessful attempts and second provider assisted. Patient tolerated the procedure well with no immediate complications.

## 2021-10-12 NOTE — Anesthesia Procedure Notes (Signed)
Central Venous Catheter Insertion Performed by: Gaynelle Adu, MD, anesthesiologist Start/End7/18/2023 6:40 AM, 10/12/2021 6:55 AM Patient location: Pre-op. Preanesthetic checklist: patient identified, IV checked, site marked, risks and benefits discussed, surgical consent, monitors and equipment checked, pre-op evaluation, timeout performed and anesthesia consent Position: Trendelenburg Lidocaine 1% used for infiltration and patient sedated Hand hygiene performed , maximum sterile barriers used  and Seldinger technique used Catheter size: 8.5 Fr Total catheter length 10. Central line was placed.Sheath introducer Procedure performed using ultrasound guided technique. Ultrasound Notes:anatomy identified, needle tip was noted to be adjacent to the nerve/plexus identified, no ultrasound evidence of intravascular and/or intraneural injection and image(s) printed for medical record Attempts: 1 Following insertion, line sutured, dressing applied and Biopatch. Post procedure assessment: blood return through all ports, free fluid flow and no air  Patient tolerated the procedure well with no immediate complications.

## 2021-10-12 NOTE — Procedures (Signed)
Extubation Procedure Note  Patient Details:   Name: Mario Arnold DOB: 1955/05/20 MRN: [id number]   Airway Documentation:    Vent end date: 10/12/21 Vent end time: 1740   Evaluation  O2 sats: stable throughout Complications: No apparent complications Patient did tolerate procedure well. Bilateral Breath Sounds: Clear, Diminished   Yes  RT extubated patient to 4L [place] per MD order/rapid wean protocol with RN at bedside. Positive cuff leak noted. NIF -37 VC 1.22. No stridor noted at this time. RT will continue to monitor as needed.   Jaquelyn Bitter 10/12/2021, 6:00 PM

## 2021-10-12 NOTE — Interval H&P Note (Signed)
History and Physical Interval Note:  10/12/2021 7:34 AM  Mario Arnold  has presented today for surgery, with the diagnosis of CAD.  The various methods of treatment have been discussed with the patient and family. After consideration of risks, benefits and other options for treatment, the patient has consented to  Procedure(s): CORONARY ARTERY BYPASS GRAFTING (CABG) (N/A) RADIAL ARTERY HARVEST (Left) TRANSESOPHAGEAL ECHOCARDIOGRAM (TEE) (N/A) as a surgical intervention.  The patient's history has been reviewed, patient examined, no change in status, stable for surgery.  I have reviewed the patient's chart and labs.  Questions were answered to the patient's satisfaction.     [name]

## 2021-10-12 NOTE — Brief Op Note (Signed)
10/12/2021  11:32 AM  PATIENT:  Mario Arnold  66 y.o. male  PRE-OPERATIVE DIAGNOSIS:  Coronary artery disease  POST-OPERATIVE DIAGNOSIS:  Coronary artery disease  PROCEDURE:  TRANSESOPHAGEAL ECHOCARDIOGRAM (TEE), CORONARY ARTERY BYPASS GRAFTING (CABG) x3 (LIMA to LAD, LEFT RADIAL ARTERY to RAMUS INTERMEDIATE, and SVG to PDA) USING LEFT INTERNAL MAMMARY ARTERY LEFT RADIAL ARTERY HARVEST, and EVH from the RIGHT THIGH GREATER SAPHENOUS VEIN  Left radial artery harvest time: 29 minutes   Left radial artery prep time: 5 minutes Vein harvest time: 16 minutes Vein prep time: 12 minutes  Findings: Thrombus in native LAD  SURGEON:  Surgeon(s) and Role:    Lightfoot, Eliezer Lofts, MD - Primary  PHYSICIAN ASSISTANT: Doree Fudge PA-C  ASSISTANTS: Raynelle Bring RNFA   ANESTHESIA:   general  EBL:  Per anesthesia, perfusion record  DRAINS:  Chest tubes placed in the mediastinal and pleural spaces    COUNTS CORRECT:  YES  DICTATION: .Dragon Dictation  PLAN OF CARE: Admit to inpatient   PATIENT DISPOSITION:  ICU - intubated and hemodynamically stable.   Delay start of Pharmacological VTE agent (>24hrs) due to surgical blood loss or risk of bleeding: no  BASELINE WEIGHT: 81.6 kg

## 2021-10-12 NOTE — Anesthesia Postprocedure Evaluation (Signed)
Anesthesia Post Note  Patient: Findley L Werts  Procedure(s) Performed: CORONARY ARTERY BYPASS GRAFTING (CABG) x3 USING LEFT INTERNAL MAMMARY ARTERY AND LEFT RADIAL ARM GRAFT (Chest) RADIAL ARTERY HARVEST (Left: Arm Lower) TRANSESOPHAGEAL ECHOCARDIOGRAM (TEE)     Patient location during evaluation: SICU Anesthesia Type: General Level of consciousness: sedated Pain management: pain level controlled Vital Signs Assessment: post-procedure vital signs reviewed and stable Respiratory status: patient remains intubated per anesthesia plan Cardiovascular status: stable Postop Assessment: no apparent nausea or vomiting Anesthetic complications: no   No notable events documented.  Last Vitals:  Vitals:   10/12/21 0545  BP: 139/80  Pulse: 70  Resp: 18  Temp: 36.4 C  SpO2: 98%    Last Pain:  Vitals:   10/12/21 0614  TempSrc:   PainSc: 0-No pain                 Kenston Longton,W. EDMOND

## 2021-10-13 ENCOUNTER — Inpatient Hospital Stay (HOSPITAL_COMMUNITY): Payer: 59

## 2021-10-13 ENCOUNTER — Encounter (HOSPITAL_COMMUNITY): Payer: Self-pay | Admitting: Thoracic Surgery (Cardiothoracic Vascular Surgery)

## 2021-10-13 DIAGNOSIS — Z951 Presence of aortocoronary bypass graft: Secondary | ICD-10-CM

## 2021-10-13 LAB — GLUCOSE, CAPILLARY
Glucose-Capillary: 124 mg/dL — ABNORMAL HIGH (ref 70–99)
Glucose-Capillary: 137 mg/dL — ABNORMAL HIGH (ref 70–99)
Glucose-Capillary: 144 mg/dL — ABNORMAL HIGH (ref 70–99)
Glucose-Capillary: 151 mg/dL — ABNORMAL HIGH (ref 70–99)
Glucose-Capillary: 160 mg/dL — ABNORMAL HIGH (ref 70–99)
Glucose-Capillary: 161 mg/dL — ABNORMAL HIGH (ref 70–99)
Glucose-Capillary: 162 mg/dL — ABNORMAL HIGH (ref 70–99)
Glucose-Capillary: 167 mg/dL — ABNORMAL HIGH (ref 70–99)
Glucose-Capillary: 170 mg/dL — ABNORMAL HIGH (ref 70–99)
Glucose-Capillary: 178 mg/dL — ABNORMAL HIGH (ref 70–99)
Glucose-Capillary: 183 mg/dL — ABNORMAL HIGH (ref 70–99)
Glucose-Capillary: 187 mg/dL — ABNORMAL HIGH (ref 70–99)
Glucose-Capillary: 196 mg/dL — ABNORMAL HIGH (ref 70–99)
Glucose-Capillary: 197 mg/dL — ABNORMAL HIGH (ref 70–99)
Glucose-Capillary: 200 mg/dL — ABNORMAL HIGH (ref 70–99)
Glucose-Capillary: 66 mg/dL — ABNORMAL LOW (ref 70–99)
Glucose-Capillary: 68 mg/dL — ABNORMAL LOW (ref 70–99)

## 2021-10-13 LAB — BASIC METABOLIC PANEL
Anion gap: 9 (ref 5–15)
BUN: 20 mg/dL (ref 8–23)
CO2: 22 mmol/L (ref 22–32)
Calcium: 8.6 mg/dL — ABNORMAL LOW (ref 8.9–10.3)
Chloride: 108 mmol/L (ref 98–111)
Creatinine, Ser: 1.28 mg/dL — ABNORMAL HIGH (ref 0.61–1.24)
GFR, Estimated: >60 mL/min (ref >60)
Glucose, Bld: 163 mg/dL — ABNORMAL HIGH (ref 70–99)
Potassium: 4 mmol/L (ref 3.5–5.1)
Sodium: 139 mmol/L (ref 135–145)

## 2021-10-13 LAB — CBC
HCT: 33.7 % — ABNORMAL LOW (ref 39.0–52.0)
Hemoglobin: 10.9 g/dL — ABNORMAL LOW (ref 13.0–17.0)
MCH: 29 pg (ref 26.0–34.0)
MCHC: 32.3 g/dL (ref 30.0–36.0)
MCV: 89.6 fL (ref 80.0–100.0)
Platelets: 133 K/uL — ABNORMAL LOW (ref 150–400)
RBC: 3.76 MIL/uL — ABNORMAL LOW (ref 4.22–5.81)
RDW: 13 % (ref 11.5–15.5)
WBC: 9.2 K/uL (ref 4.0–10.5)
nRBC: 0 % (ref 0.0–0.2)

## 2021-10-13 LAB — MAGNESIUM: Magnesium: 2.3 mg/dL (ref 1.7–2.4)

## 2021-10-13 MED ORDER — ENOXAPARIN SODIUM 40 MG/0.4ML IJ SOSY
40.0000 mg | PREFILLED_SYRINGE | Freq: Every day | INTRAMUSCULAR | Status: DC
  Administered 2021-10-13 – 2021-10-16 (×4): 40 mg via SUBCUTANEOUS
  Filled 2021-10-13 (×3): qty 0.4

## 2021-10-13 MED ORDER — INSULIN DETEMIR 100 UNIT/ML ~~LOC~~ SOLN
10.0000 [IU] | Freq: Every day | SUBCUTANEOUS | Status: DC
Start: 2021-10-14 — End: 2021-10-14
  Administered 2021-10-14: 10 [IU] via SUBCUTANEOUS
  Filled 2021-10-13: qty 0.1

## 2021-10-13 MED ORDER — INSULIN DETEMIR 100 UNIT/ML ~~LOC~~ SOLN
10.0000 [IU] | Freq: Once | SUBCUTANEOUS | Status: AC
  Administered 2021-10-13: 10 [IU] via SUBCUTANEOUS
  Filled 2021-10-13: qty 0.1

## 2021-10-13 MED ORDER — INSULIN ASPART 100 UNIT/ML IJ SOLN
0.0000 [IU] | INTRAMUSCULAR | Status: DC
  Administered 2021-10-13 – 2021-10-14 (×7): 4 [IU] via SUBCUTANEOUS

## 2021-10-13 MED ORDER — PROMETHAZINE HCL 12.5 MG PO TABS: 12.5000 mg | ORAL_TABLET | Freq: Four times a day (QID) | ORAL | Status: DC | PRN

## 2021-10-13 MED ORDER — ATROPINE SULFATE 1 MG/10ML IJ SOSY
PREFILLED_SYRINGE | INTRAMUSCULAR | Status: AC
  Filled 2021-10-13: qty 10

## 2021-10-13 NOTE — Progress Notes (Signed)
      [address]       [place] [address]             [phone]                 1 Day Post-Op Procedure(s) (LRB): CORONARY ARTERY BYPASS GRAFTING (CABG) x3 USING LEFT INTERNAL MAMMARY ARTERY AND LEFT RADIAL ARM GRAFT (N/A) RADIAL ARTERY HARVEST (Left) TRANSESOPHAGEAL ECHOCARDIOGRAM (TEE) (N/A)   Events: No events Extubated overnight _______________________________________________________________ Vitals: BP 107/72   Pulse 77   Temp 99 F (37.2 C) (Core)   Resp 14   Ht 5\' 7"  (1.702 m)   Wt 84.5 kg   SpO2 96%   BMI 29.18 kg/m  Filed Weights   10/12/21 0545 10/13/21 0600  Weight: 81.6 kg 84.5 kg     - Neuro: alert NAD  - Cardiovascular: sinus  Drips: nitro 20.   CVP:  [1 mmHg-66 mmHg] 6 mmHg  - Pulm: EWOB   ABG    Component Value Date/Time   PHART 7.318 (L) 10/12/2021 2030   PCO2ART 44.2 10/12/2021 2030   PO2ART 88 10/12/2021 2030   HCO3 22.7 10/12/2021 2030   TCO2 24 10/12/2021 2030   ACIDBASEDEF 3.0 (H) 10/12/2021 2030   O2SAT 96 10/12/2021 2030    - Abd: ND - Extremity: trace edema  .Intake/Output      07/18 0701 07/19 0700 07/19 0701 07/20 0700   I.V. (mL/kg) 2679 (31.7) 98.5 (1.2)   Blood 500    IV Piggyback 1277.5    Total Intake(mL/kg) 4456.4 (52.7) 98.5 (1.2)   Urine (mL/kg/hr) 3155 (1.6) 75 (0.5)   Stool  0   Blood 750    Chest Tube 350 50   Total Output 4255 125   Net +201.4 -26.5        Stool Occurrence  0 x      _______________________________________________________________ Labs:    Latest Ref Rng & Units 10/13/2021    3:24 AM 10/12/2021    8:30 PM 10/12/2021    6:48 PM  CBC  WBC 4.0 - 10.5 K/uL 9.2     Hemoglobin 13.0 - 17.0 g/dL 10/14/2021  32.9  51.8   Hematocrit 39.0 - 52.0 % 33.7  32.0  34.0   Platelets 150 - 400 K/uL 133         Latest Ref Rng & Units 10/13/2021    3:24 AM 10/12/2021    8:30 PM 10/12/2021    6:48 PM  CMP  Glucose 70 - 99 mg/dL 10/14/2021     BUN 8 - 23 mg/dL 20     Creatinine 660 -  1.24 mg/dL 6.30     Sodium 1.60 - 109 mmol/L 139  144  142   Potassium 3.5 - 5.1 mmol/L 4.0  3.9  4.1   Chloride 98 - 111 mmol/L 108     CO2 22 - 32 mmol/L 22     Calcium 8.9 - 10.3 mg/dL 8.6       CXR: PV congestion  _______________________________________________________________  Assessment and Plan: POD 1 s/p CABG  Neuro: pain controlled CV: on a/s/bb.  On plavix for LAD thrombus.  Will remove pacing wires Pulm: IS ambulation Renal: creat up slightly.  Will monitor GI: on diet Heme: stable ID: [email or web address] Endo: SSI Dispo: continue ICU care   323 10/13/2021 8:58 AM

## 2021-10-13 NOTE — Progress Notes (Signed)
TCTS DAILY ICU PROGRESS NOTE                   [address]            [place] [address]          [phone]   1 Day Post-Op Procedure(s) (LRB): CORONARY ARTERY BYPASS GRAFTING (CABG) x3 USING LEFT INTERNAL MAMMARY ARTERY AND LEFT RADIAL ARM GRAFT (N/A) RADIAL ARTERY HARVEST (Left) TRANSESOPHAGEAL ECHOCARDIOGRAM (TEE) (N/A)  Total Length of Stay:  LOS: 1 day   Subjective: Extubated at 5:40 PM yesterday. Awake and alert, sitting up in bed.  Has no complaints or concerns.  Says pain control is adequate.  Objective: Vital signs in last 24 hours: Temp:  [97.5 F (36.4 C)-99.5 F (37.5 C)] 99 F (37.2 C) (07/19 0800) Pulse Rate:  [77-92] 77 (07/19 0800) Resp:  [11-113] 14 (07/19 0800) BP: (96-113)/(58-75) 107/72 (07/19 0800) SpO2:  [95 %-100 %] 96 % (07/19 0800) Arterial Line BP: (103-154)/(50-87) 133/76 (07/19 0800) FiO2 (%):  [40 %-50 %] 40 % (07/18 1705) Weight:  [84.5 kg] 84.5 kg (07/19 0600)  Filed Weights   10/12/21 0545 10/13/21 0600  Weight: 81.6 kg 84.5 kg    Weight change: 2.853 kg   Hemodynamic parameters for last 24 hours: CVP:  [1 mmHg-66 mmHg] 6 mmHg  Intake/Output from previous day: 07/18 0701 - 07/19 0700 In: 4456.4 [I.V.:2679; Blood:500; IV Piggyback:1277.5] Out: 4255 [Urine:3155; Blood:750; Chest Tube:350]  Intake/Output this shift: Total I/O In: 98.5 [I.V.:98.5] Out: 125 [Urine:75; Chest Tube:50]  Current Meds: Scheduled Meds:  acetaminophen  1,000 mg Oral Q6H   Or   acetaminophen (TYLENOL) oral liquid 160 mg/5 mL  1,000 mg Per Tube Q6H   amLODipine  2.5 mg Oral Daily   aspirin EC  81 mg Oral Daily   Or   aspirin  81 mg Per Tube Daily   atorvastatin  80 mg Oral Daily   bisacodyl  10 mg Oral Daily   Or   bisacodyl  10 mg Rectal Daily   Chlorhexidine Gluconate Cloth  6 each Topical Daily   clopidogrel  75 mg Oral Daily   docusate sodium  200 mg Oral Daily   ezetimibe  10 mg Oral QPM   fenofibrate  160 mg Oral Daily    metoprolol tartrate  12.5 mg Oral BID   [START ON 10/14/2021] pantoprazole  40 mg Oral Daily   sodium chloride flush  3 mL Intravenous Q12H   Continuous Infusions:  sodium chloride 20 mL/hr at 10/13/21 0800   sodium chloride     sodium chloride      ceFAZolin (ANCEF) IV Stopped (10/13/21 0534)   insulin 2 Units/hr (10/13/21 7001)   lactated ringers     lactated ringers     lactated ringers 20 mL/hr at 10/13/21 0800   PRN Meds:.sodium chloride, dextrose, lactated ringers, metoprolol tartrate, morphine injection, ondansetron (ZOFRAN) IV, oxyCODONE, sodium chloride flush, traMADol  General appearance: alert, cooperative, and no distress Neurologic: intact Heart: RRR, no significant arrhythmias Lungs: Breath sounds clear, shallow.  Chest tube drainage 150 mL over past 12 hours.  Chest x-ray is showing expected postoperative changes.  Mild loss in the left lower lung zone. Abdomen: Soft, nontender.  Hypoactive bowel sounds Extremities: Good perfusion throughout.  No peripheral edema.  The left arm radial incision is covered with a dry dressing.  Left hand is well-perfused with no sensory deficits.  The right lower extremity EVH incision is covered  with a dry dressing. Wound: Sternotomy is covered with a dry Aquacel dressing.  Lab Results: CBC: Recent Labs    10/12/21 1834 10/12/21 1848 10/12/21 2030 10/13/21 0324  WBC 10.3  --   --  9.2  HGB 11.2*   < > 10.9* 10.9*  HCT 34.9*   < > 32.0* 33.7*  PLT 143*  --   --  133*   < > = values in this interval not displayed.   BMET:  Recent Labs    10/12/21 1834 10/12/21 1848 10/12/21 2030 10/13/21 0324  NA 138   < > 144 139  K 4.3   < > 3.9 4.0  CL 110  --   --  108  CO2 21*  --   --  22  GLUCOSE 136*  --   --  163*  BUN 21  --   --  20  CREATININE 1.24  --   --  1.28*  CALCIUM 8.6*  --   --  8.6*   < > = values in this interval not displayed.    CMET: Lab Results  Component Value Date   WBC 9.2 10/13/2021   HGB 10.9 (L)  10/13/2021   HCT 33.7 (L) 10/13/2021   PLT 133 (L) 10/13/2021   GLUCOSE 163 (H) 10/13/2021   CHOL 117 09/23/2021   TRIG 107 09/23/2021   HDL 30 (L) 09/23/2021   LDLDIRECT 55.5 05/07/2013   LDLCALC 67 09/23/2021   ALT 31 10/11/2021   AST 30 10/11/2021   NA 139 10/13/2021   K 4.0 10/13/2021   CL 108 10/13/2021   CREATININE 1.28 (H) 10/13/2021   BUN 20 10/13/2021   CO2 22 10/13/2021   TSH 2.520 09/29/2020   INR 1.3 (H) 10/12/2021   HGBA1C 10.0 (H) 10/11/2021   MICROALBUR 30 08/10/2021      PT/INR:  Recent Labs    10/12/21 1319  LABPROT 16.1*  INR 1.3*   Radiology: [facility] Chest Port 1 View  Result Date: 10/13/2021 CLINICAL DATA:  Provided history: Chest tube present status post open heart surgery. Pneumothorax. Chest soreness. EXAM: PORTABLE CHEST 1 VIEW COMPARISON:  Prior chest radiograph 10/12/2021 and earlier. FINDINGS: Right IJ approach introducer sheath with Swan-Ganz catheter terminating at the level of the mid to lower SVC. Interval extubation and removal of a previously demonstrated enteric tube. Stable position of a left-sided chest tube with tip projecting at the level of the left lung base. Shallow inspiration radiograph, accentuating the cardiac silhouette and limiting evaluation of heart size. Prior median sternotomy/CABG. Ill-defined opacity within the left lung base, slightly increased from the prior chest radiograph of 10/12/2021 and favored to reflect atelectasis. Possible small left pleural effusion, unchanged. No evidence of pneumothorax. IMPRESSION: 1. Support apparatus, as described. 2. Shallow inspiration radiograph. 3. Ill-defined opacity within the left lung base, increased from the prior chest radiograph of 10/12/2021 and favored to reflect atelectasis. 4. Possible small left pleural effusion, unchanged. Electronically Signed   By: Jackey Loge D.O.   On: 10/13/2021 08:43   DG Chest Port 1 View  Result Date: 10/12/2021 CLINICAL DATA:  Postoperative; pneumothorax  EXAM: PORTABLE CHEST 1 VIEW COMPARISON:  Radiographs 10/11/2021 FINDINGS: Interval postoperative changes of CABG. Sternotomy wires. Endotracheal tube in the intrathoracic trachea with tip 5.7 cm from the carina. Right IJ CVC tip in the mid SVC. Left chest tube. Subdiaphragmatic enteric tube. No definite pneumothorax. Small left-greater-than-right pleural effusions. Left basilar atelectasis/consolidation. IMPRESSION: Expected postoperative changes of CABG.  No  definite pneumothorax. Electronically Signed   By: Minerva Fester M.D.   On: 10/12/2021 13:46     Assessment/Plan: S/P Procedure(s) (LRB): CORONARY ARTERY BYPASS GRAFTING (CABG) x3 USING LEFT INTERNAL MAMMARY ARTERY AND LEFT RADIAL ARM GRAFT (N/A) RADIAL ARTERY HARVEST (Left) TRANSESOPHAGEAL ECHOCARDIOGRAM (TEE) (N/A)  -Postop day 1 CABG x3 for progressive coronary artery disease presenting with angina pectoris and EF 45 to 50%.  Stable vital signs and hemodynamics since surgery.  Requiring no vasopressor support.  We will transition the nitroglycerin to oral amlodipine.  Continue aspirin, beta-blocker, statin.  Remove pacer wires. Mobilize with PT.  -PULM-oxygenating well on 4 L nasal cannula.  Has mild basilar atelectasis on chest x-ray.  Work on Occupational hygienist.  -Expected acute blood loss anemia-mild, monitor.  -Type 2 diabetes mellitus-managed with metformin, glimepiride, and Actos at home.  Currently well controlled with the insulin drip.  We will transition to long-acting insulin and sliding scale today.  -Renal-slight increase in creatinine from baseline of 1.1.  Urine output adequate.  Monitor.  -Volume excess-mild, current weight 3 kg above pre-op. Diurese as tolerated.   -DVT PPX- to begin [place] enoxaparin today.    Leary Roca, PA-C [phone] 10/13/2021 8:50 AM

## 2021-10-14 ENCOUNTER — Inpatient Hospital Stay (HOSPITAL_COMMUNITY): Payer: 59

## 2021-10-14 ENCOUNTER — Telehealth (HOSPITAL_COMMUNITY): Payer: Self-pay | Admitting: Pharmacy Technician

## 2021-10-14 ENCOUNTER — Other Ambulatory Visit (HOSPITAL_COMMUNITY): Payer: Self-pay

## 2021-10-14 LAB — GLUCOSE, CAPILLARY
Glucose-Capillary: 189 mg/dL — ABNORMAL HIGH (ref 70–99)
Glucose-Capillary: 183 mg/dL — ABNORMAL HIGH (ref 70–99)
Glucose-Capillary: 191 mg/dL — ABNORMAL HIGH (ref 70–99)
Glucose-Capillary: 171 mg/dL — ABNORMAL HIGH (ref 70–99)
Glucose-Capillary: 190 mg/dL — ABNORMAL HIGH (ref 70–99)

## 2021-10-14 LAB — BASIC METABOLIC PANEL
Anion gap: 7 (ref 5–15)
BUN: 15 mg/dL (ref 8–23)
CO2: 25 mmol/L (ref 22–32)
Calcium: 8.4 mg/dL — ABNORMAL LOW (ref 8.9–10.3)
Chloride: 104 mmol/L (ref 98–111)
Creatinine, Ser: 1.16 mg/dL (ref 0.61–1.24)
GFR, Estimated: >60 mL/min (ref >60)
Glucose, Bld: 167 mg/dL — ABNORMAL HIGH (ref 70–99)
Potassium: 3.9 mmol/L (ref 3.5–5.1)
Sodium: 136 mmol/L (ref 135–145)

## 2021-10-14 LAB — CBC
HCT: 33.5 % — ABNORMAL LOW (ref 39.0–52.0)
Hemoglobin: 11 g/dL — ABNORMAL LOW (ref 13.0–17.0)
MCH: 29.5 pg (ref 26.0–34.0)
MCHC: 32.8 g/dL (ref 30.0–36.0)
MCV: 89.8 fL (ref 80.0–100.0)
Platelets: 124 10*3/uL — ABNORMAL LOW (ref 150–400)
RBC: 3.73 MIL/uL — ABNORMAL LOW (ref 4.22–5.81)
RDW: 13.3 % (ref 11.5–15.5)
WBC: 9.1 10*3/uL (ref 4.0–10.5)
nRBC: 0 % (ref 0.0–0.2)

## 2021-10-14 MED ORDER — SODIUM CHLORIDE 0.9% FLUSH: 3.0000 mL | INTRAVENOUS | Status: DC | PRN

## 2021-10-14 MED ORDER — INSULIN ASPART 100 UNIT/ML IJ SOLN
0.0000 [IU] | Freq: Three times a day (TID) | INTRAMUSCULAR | Status: DC
  Administered 2021-10-14 – 2021-10-15 (×3): 2 [IU] via SUBCUTANEOUS
  Administered 2021-10-15: 3 [IU] via SUBCUTANEOUS
  Administered 2021-10-16 (×3): 2 [IU] via SUBCUTANEOUS
  Administered 2021-10-17: 1 [IU] via SUBCUTANEOUS

## 2021-10-14 MED ORDER — FUROSEMIDE 40 MG PO TABS
40.0000 mg | ORAL_TABLET | Freq: Every day | ORAL | Status: DC
  Administered 2021-10-14 – 2021-10-17 (×4): 40 mg via ORAL
  Filled 2021-10-14 (×4): qty 1

## 2021-10-14 MED ORDER — SODIUM CHLORIDE 0.9% FLUSH
3.0000 mL | Freq: Two times a day (BID) | INTRAVENOUS | Status: DC
Start: 2021-10-14 — End: 2021-10-17
  Administered 2021-10-14 – 2021-10-17 (×7): 3 mL via INTRAVENOUS

## 2021-10-14 MED ORDER — SODIUM CHLORIDE 0.9 % IV SOLN: 250.0000 mL | INTRAVENOUS | Status: DC | PRN

## 2021-10-14 MED ORDER — POTASSIUM CHLORIDE CRYS ER 20 MEQ PO TBCR
40.0000 meq | EXTENDED_RELEASE_TABLET | Freq: Every day | ORAL | Status: DC
  Administered 2021-10-14 – 2021-10-16 (×3): 40 meq via ORAL
  Filled 2021-10-14 (×2): qty 2

## 2021-10-14 MED ORDER — INSULIN DETEMIR 100 UNIT/ML ~~LOC~~ SOLN
16.0000 [IU] | Freq: Every day | SUBCUTANEOUS | Status: DC
  Administered 2021-10-15 – 2021-10-17 (×3): 16 [IU] via SUBCUTANEOUS
  Filled 2021-10-14 (×3): qty 0.16

## 2021-10-14 MED ORDER — ~~LOC~~ CARDIAC SURGERY, PATIENT & FAMILY EDUCATION
Freq: Once | Status: AC
Start: 2021-10-14 — End: 2021-10-14

## 2021-10-14 MED FILL — Calcium Chloride Inj 10%: INTRAVENOUS | Qty: 10 | Status: AC

## 2021-10-14 MED FILL — Sodium Chloride IV Soln 0.9%: INTRAVENOUS | Qty: 2000 | Status: AC

## 2021-10-14 MED FILL — Sodium Bicarbonate IV Soln 8.4%: INTRAVENOUS | Qty: 50 | Status: AC

## 2021-10-14 MED FILL — Electrolyte-R (PH 7.4) Solution: INTRAVENOUS | Qty: 4000 | Status: AC

## 2021-10-14 MED FILL — Heparin Sodium (Porcine) Inj 1000 Unit/ML: INTRAMUSCULAR | Qty: 20 | Status: AC

## 2021-10-14 MED FILL — Potassium Chloride Inj 2 mEq/ML: INTRAVENOUS | Qty: 40 | Status: AC

## 2021-10-14 MED FILL — Lidocaine HCl Local Preservative Free (PF) Inj 2%: INTRAMUSCULAR | Qty: 15 | Status: AC

## 2021-10-14 MED FILL — Mannitol IV Soln 20%: INTRAVENOUS | Qty: 500 | Status: AC

## 2021-10-14 MED FILL — Heparin Sodium (Porcine) Inj 1000 Unit/ML: Qty: 1000 | Status: AC

## 2021-10-14 NOTE — Care Management (Signed)
  Transition of Care (TOC) Screening Note   Patient Details  Name: Mario Arnold Date of Birth: Sep 10, 1955   Transition of Care [facility]) CM/SW Contact:    Gala Lewandowsky, RN Phone Number: 10/14/2021, 3:56 PM    Transition of Care Department [facility]) has reviewed the patient and no TOC needs have been identified at this time. POD-2 CABG, Case Manager will continue to follow for transition of care needs as the patient progresses.

## 2021-10-14 NOTE — Inpatient Diabetes Management (Addendum)
Inpatient Diabetes Program Recommendations  AACE/ADA: New Consensus Statement on Inpatient Glycemic Control (2015)  Target Ranges:  Prepandial:   less than 140 mg/dL      Peak postprandial:   less than 180 mg/dL (1-2 hours)      Critically ill patients:  140 - 180 mg/dL   Lab Results  Component Value Date   GLUCAP 183 (H) 10/14/2021   HGBA1C 10.0 (H) 10/11/2021    Review of Glycemic Control  Latest Reference Range & Units 10/13/21 23:42 10/14/21 03:33 10/14/21 07:52  Glucose-Capillary 70 - 99 mg/dL 415 (H) 830 (H) 940 (H)  (H): Data is abnormally high Diabetes history: Type 2 DM Outpatient Diabetes medications: Trulicity 0.75 qwk, Glyburide 5 mg BID, Metformin 1000 mg BID, actos 45 mg QD Current orders for Inpatient glycemic control: Levemir 10 units QD, Novolog 0-24 units Q4H  Inpatient Diabetes Program Recommendations:    Now that patient has diet order consider: -Changing correction to Novolog 0-9 units TID & HS -Increasing Levemir to 16 units QD -Discontinuing Actos at discharge given decreased EF  Addendum: Spoke with patient regarding outpatient diabetes management. Patient was off of Trulicity for awhile due to affordability with Trulicity.  Reviewed patient's current A1c of 10.0%. Explained what a A1c is and what it measures. Also reviewed goal A1c with patient, importance of good glucose control @ home, and blood sugar goals. Reviewed patho of DM, need for improved control, survival skills, interventions, differences between long acting and short acting insulin vs GLP, vascular changes, risk for infection and other commorbidities.  Patient has a meter and reports checking once per day. Reviewed recommendations for checking CBGS and when to call MD. Patient reports that initially price for Trulicity was $25, however recently increased to >$900. Pharmacy has assisted prior and he obtained copay cards to further assist and this has made it more manageable, but cost is still  high. He has a supply at home at this time.  We reviewed insulin as a back up option, especially if he is out of his supply of GLP or when affordability may be tenuous.   Through benefits check: -Rybelsus $25 for 30 day supply with copay card.   Secure chat sent to MD.   Thanks, Lujean Rave, MSN, RNC-OB Diabetes Coordinator [phone] (8a-5p)

## 2021-10-14 NOTE — Progress Notes (Signed)
CARDIAC REHAB PHASE I   PRE:  Rate/Rhythm: 80 NSR  BP:  Sitting: 129/73      SaO2: 93 RA  MODE:  Ambulation: 290 ft   POST:  Rate/Rhythm: 92 NSR  BP:  Sitting: 136/75      SaO2: 95 RA   Seen pt from 1310-1337, pt was received from bed with moderate assistance following sternal precautions and ambulated through hallways with RW. During ambulation, pt walked well with a nice balanced gait w/o symptoms. Pt was returned to bed w/o complaints. Pt wife will help at home following d/c. Pt has rollator and walking cane for home use.   Mario Arnold  1:42 PM 10/14/2021

## 2021-10-14 NOTE — Telephone Encounter (Signed)
Pharmacy Patient Advocate Encounter  Insurance verification completed.    The patient is insured through [employer]   The patient is currently admitted and ran test claims for the following: Ozempic, Rybelsus.  Copays and coinsurance results were relayed to Inpatient clinical team.

## 2021-10-14 NOTE — Progress Notes (Signed)
[address]       [place] [address]             [phone]                 2 Days Post-Op Procedure(s) (LRB): CORONARY ARTERY BYPASS GRAFTING (CABG) x3 USING LEFT INTERNAL MAMMARY ARTERY AND LEFT RADIAL ARM GRAFT (N/A) RADIAL ARTERY HARVEST (Left) TRANSESOPHAGEAL ECHOCARDIOGRAM (TEE) (N/A)   Events: No events  _______________________________________________________________ Vitals: BP 127/73   Pulse 75   Temp 99.3 F (37.4 C) (Oral)   Resp 16   Ht 5\' 7"  (1.702 m)   Wt 85.7 kg   SpO2 94%   BMI 29.59 kg/m  Filed Weights   10/12/21 0545 10/13/21 0600 10/14/21 0600  Weight: 81.6 kg 84.5 kg 85.7 kg     - Neuro: alert NAD  - Cardiovascular: sinus  Drips: none CVP:  [15 mmHg-23 mmHg] 23 mmHg  - Pulm: EWOB   ABG    Component Value Date/Time   PHART 7.318 (L) 10/12/2021 2030   PCO2ART 44.2 10/12/2021 2030   PO2ART 88 10/12/2021 2030   HCO3 22.7 10/12/2021 2030   TCO2 24 10/12/2021 2030   ACIDBASEDEF 3.0 (H) 10/12/2021 2030   O2SAT 96 10/12/2021 2030    - Abd: ND - Extremity: trace edema  .Intake/Output      07/19 0701 07/20 0700 07/20 0701 07/21 0700   P.Mario. 120    I.V. (mL/kg) 705.4 (8.2)    Blood     IV Piggyback 200.1    Total Intake(mL/kg) 1025.4 (12)    Urine (mL/kg/hr) 1600 (0.8)    Emesis/NG output 0    Stool 0    Blood     Chest Tube 210    Total Output 1810    Net -784.6         Urine Occurrence 0 x    Stool Occurrence 0 x    Emesis Occurrence 1 x       _______________________________________________________________ Labs:    Latest Ref Rng & Units 10/14/2021    3:40 AM 10/13/2021    3:24 AM 10/12/2021    8:30 PM  CBC  WBC 4.0 - 10.5 K/uL 9.1  9.2    Hemoglobin 13.0 - 17.0 g/dL 10/14/2021  96.2  95.2   Hematocrit 39.0 - 52.0 % 33.5  33.7  32.0   Platelets 150 - 400 K/uL 124  133        Latest Ref Rng & Units 10/14/2021    3:40 AM 10/13/2021    3:24 AM 10/12/2021    8:30 PM  CMP  Glucose 70 - 99 mg/dL 10/14/2021   324    BUN 8 - 23 mg/dL 15  20    Creatinine 401 - 1.24 mg/dL 0.27  2.53    Sodium 6.64 - 145 mmol/L 136  139  144   Potassium 3.5 - 5.1 mmol/L 3.9  4.0  3.9   Chloride 98 - 111 mmol/L 104  108    CO2 22 - 32 mmol/L 25  22    Calcium 8.9 - 10.3 mg/dL 8.4  8.6      CXR: PV congestion, small L effusion  _______________________________________________________________  Assessment and Plan: POD 2 s/p CABG  Neuro: pain controlled CV: on a/s/bb.  On plavix for LAD thrombus.   Pulm: IS ambulation Renal: diuresis today GI: on diet Heme: stable ID: [email or web address] Endo: SSI Dispo: floor   Mario Arnold Mario Arnold  10/14/2021 8:02 AM

## 2021-10-14 NOTE — TOC Benefit Eligibility Note (Signed)
Patient Product/process development scientist completed.    The patient is currently admitted and upon discharge could be taking Rybelsus 14 mg tablets.  The current 30 day co-pay is, $182.25.   The patient is currently admitted and upon discharge could be taking Ozempic Pens.  The current 30 day co-pay is, $182.25.   The patient is insured through [employer]    Mario Arnold, CPhT Pharmacy Patient Advocate Specialist [facility] Patient Advocate Team Direct Number: [phone]  Fax: [phone]

## 2021-10-14 NOTE — Progress Notes (Signed)
Patient ID: Mario Arnold, male   DOB: 30-Apr-1955, 66 y.o.   MRN: [id number] TCTS Evening Rounds:  Hemodynamically stable in sinus rhythm  Sats 92% on RA. Awaiting bed on 4E.

## 2021-10-15 LAB — BASIC METABOLIC PANEL
Anion gap: 12 (ref 5–15)
BUN: 15 mg/dL (ref 8–23)
CO2: 20 mmol/L — ABNORMAL LOW (ref 22–32)
Calcium: 8.7 mg/dL — ABNORMAL LOW (ref 8.9–10.3)
Chloride: 104 mmol/L (ref 98–111)
Creatinine, Ser: 1.24 mg/dL (ref 0.61–1.24)
GFR, Estimated: >60 mL/min (ref >60)
Glucose, Bld: 156 mg/dL — ABNORMAL HIGH (ref 70–99)
Potassium: 3.8 mmol/L (ref 3.5–5.1)
Sodium: 136 mmol/L (ref 135–145)

## 2021-10-15 LAB — GLUCOSE, CAPILLARY
Glucose-Capillary: 189 mg/dL — ABNORMAL HIGH (ref 70–99)
Glucose-Capillary: 156 mg/dL — ABNORMAL HIGH (ref 70–99)
Glucose-Capillary: 236 mg/dL — ABNORMAL HIGH (ref 70–99)
Glucose-Capillary: 175 mg/dL — ABNORMAL HIGH (ref 70–99)

## 2021-10-15 LAB — CBC
HCT: 36.8 % — ABNORMAL LOW (ref 39.0–52.0)
Hemoglobin: 12.2 g/dL — ABNORMAL LOW (ref 13.0–17.0)
MCH: 29 pg (ref 26.0–34.0)
MCHC: 33.2 g/dL (ref 30.0–36.0)
MCV: 87.4 fL (ref 80.0–100.0)
Platelets: 138 10*3/uL — ABNORMAL LOW (ref 150–400)
RBC: 4.21 MIL/uL — ABNORMAL LOW (ref 4.22–5.81)
RDW: 12.8 % (ref 11.5–15.5)
WBC: 7.9 10*3/uL (ref 4.0–10.5)
nRBC: 0 % (ref 0.0–0.2)

## 2021-10-15 MED ORDER — ORAL CARE MOUTH RINSE
15.0000 mL | OROMUCOSAL | Status: DC | PRN
Start: 2021-10-15 — End: 2021-10-17

## 2021-10-15 NOTE — Discharge Summary (Signed)
Physician Discharge Summary  Patient ID: Mario Arnold MRN: [id number] DOB/AGE: 64-Mar-1957 64 y.o.  Admit date: 10/12/2021 Discharge date: 10/17/2021  Admission Diagnoses:  Multivessel coronary artery disease Angina pectoris Type 2 diabetes mellitus Dyslipidemia History of TIA Bilateral carotid artery disease Hypertension  Discharge Diagnoses:   Multivessel coronary artery disease Angina pectoris Type 2 diabetes mellitus Dyslipidemia History of TIA Bilateral carotid artery disease Hypertension S/P CABG x 3 Expected acute blood loss anemia  Discharged Condition: stable  HPI: This is a 66 year old male with a history of coronary artery disease presents for surgical evaluation.   He was being worked up for spinal surgery, and on evaluation he was noted to have anginal symptoms. He recently underwent a left heart cath which showed progression of three-vessel coronary disease.  He also has in-stent restenosis of his LAD stent.  He does remain active but has dyspnea on exertion.  He denies any rest pains.  Dr. Kipp Brood discussed the need for coronary artery bypass grafting surgery. Potential risks, benefits, and complications of the surgery were discussed with the patient and he agreed to proceed with surgery. Pre operative carotid duplex US showed no significant internal carotid artery stenosis bilaterally.   Hospital Course: Patient underwent a CABG x 3. He was transported from the OR to [facility] ICU in stable condition.  He remained hemodynamically stable requiring no inotropic support.  He was weaned from the ventilator routinely and extubated at around 5:40 PM on the day of surgery.  On the first postoperative day, the nitroglycerin that had been utilized to optimize the radial graft patency was discontinued and replaced with oral amlodipine.  The insulin drip was tapered off and his glucose was monitored and managed with sliding scale insulin.  He was mobilized with the assistance of  physical therapy and progressed well with mobility.  Chest tubes and pacer wires were removed routinely.  He was started on Lopressor and this was titrated accordingly. He was also started on Plavix for thrombus in the native LAD. He was ready for transfer to [facility]E Progressive [facility] on postop day 2 but remained in the ICU unit due to bed availability. He regained independent mobility.  He had return of appropriate bowel bladder function.  Surgical wounds were healing with no sign of complication.  He has been ambulating on room air. He has been tolerating a diet and has had a bowel movement.He was felt to be ready for discharge to home on this date.  Consults: None  Significant Diagnostic Studies:   CLINICAL DATA:  Shortness of breath, recent cardiac surgery   EXAM: PORTABLE CHEST 1 VIEW   COMPARISON:  Previous studies including the examination of 10/13/2021   FINDINGS: Transverse diameter of heart is increased. Small bilateral pleural effusions are seen, more so on the left side. Left chest tube is noted in place. Increased density in the lower lung fields may be due to pleural effusions and possibly underlying atelectasis. There are no signs of alveolar pulmonary edema. Tip of right IJ central venous catheter is seen in superior vena cava. Stomach is less distended.   IMPRESSION: Small bilateral pleural effusions, more so on the left side. Atelectasis is seen in the lower lung fields, more so on the left side. Overall, no significant interval changes are noted in the lung fields.     Electronically Signed   By: Elmer Picker M.D.   On: 10/14/2021 08:44    Treatments: Surgery  10/12/2021 Patient:  Mario Arnold Pre-Op Dx:  3V CAD DM HTN HLP CHF   Post-op Dx:  same Procedure: CABG X 3.  LIMA LAD, Left radial artery to ramus (Y graft off vein), RSVG to PDA   Endoscopic greater saphenous vein harvest on the right Open L radial artery harvest     Surgeon and Role:       * Lightfoot, Lucile Crater, MD - Primary    * Josie Saunders, PA-C - assisting An experienced assistant was required given the complexity of this surgery and the standard of surgical care. The assistant was needed for exposure, dissection, suctioning, retraction of delicate tissues and sutures, instrument exchange and for overall help during this procedure.     Anesthesia  general EBL:  582ml Blood Administration: none Xclamp Time:  48 min Pump Time:  59min   Drains: 22 F blake drain: L, mediastinal  Wires: ventricular Counts: correct     Indications: 66 year old male with three-vessel coronary artery disease.  We discussed risks and benefits of surgical vascularization, and he is agreeable to proceed.  He will require grafting to the LAD, PDA and ramus intermedius.  The circumflex is a small vessel, and likely graftable.  We will also use his left radial artery as one of the conduits.   Findings: Thrombus in LAD.  Good radial, LIMA, and vein.  Calcified LAD.  Good PDA, and RI  Discharge Exam: Blood pressure (!) 147/88, pulse 85, temperature 98.4 F (36.9 C), temperature source Oral, resp. rate 16, height $RemoveBe'5\' 7"'kUoYpmkRo$  (1.702 m), weight 72.5 kg, SpO2 98 %. Cardiovascular: RRR Pulmonary:Slightly diminished bibasilar breath sounds Abdomen: Soft, non tender, bowel sounds present. Extremities: Mild bilateral lower extremity edema. LUE motor/sensory intact Wounds: Sternal, LUE, and RLE wounds are clean and dry.  No erythema or signs of infection.    Disposition:  Discharge disposition: 01-Home or Self Care       Discharge Instructions     Amb Referral to Cardiac Rehabilitation   Complete by: As directed    Diagnosis: CABG   CABG X ___: 3   After initial evaluation and assessments completed: Virtual Based Care may be provided alone or in conjunction with Phase 2 Cardiac Rehab based on patient barriers.: Yes      Allergies as of 10/17/2021       Reactions   Nsaids Other (See Comments)    Aleve* bad on Kidneys    Dilaudid [hydromorphone] Nausea And Vomiting        Medication List     STOP taking these medications    diclofenac 50 MG tablet Commonly known as: CATAFLAM   isosorbide mononitrate 60 MG 24 hr tablet Commonly known as: IMDUR   losartan 25 MG tablet Commonly known as: COZAAR   nitroGLYCERIN 0.4 MG SL tablet Commonly known as: NITROSTAT   omega-3 acid ethyl esters 1 g capsule Commonly known as: LOVAZA   traMADol 50 MG tablet Commonly known as: ULTRAM       TAKE these medications    acetaminophen 500 MG tablet Commonly known as: TYLENOL Take 1,000 mg by mouth every 8 (eight) hours as needed for mild pain.   amLODipine 2.5 MG tablet Commonly known as: NORVASC Take 1 tablet (2.5 mg total) by mouth daily. For one month then stop. Start taking on: October 18, 2021   aspirin EC 81 MG tablet Take 1 tablet (81 mg total) by mouth daily. Swallow whole.   atorvastatin 80 MG tablet Commonly known as: LIPITOR Take 1 tablet by mouth once daily  blood glucose meter kit and supplies Dispense based on patient and insurance preference. Use up to four times daily as directed. (FOR ICD-10 E10.9, E11.9).   clopidogrel 75 MG tablet Commonly known as: PLAVIX Take 1 tablet (75 mg total) by mouth daily. Start taking on: October 18, 2021   ezetimibe 10 MG tablet Commonly known as: ZETIA Take 1 tablet (10 mg total) by mouth every evening.   fenofibrate 160 MG tablet Take 1 tablet (160 mg total) by mouth daily.   furosemide 40 MG tablet Commonly known as: LASIX Take 1 tablet (40 mg total) by mouth daily. For 5 days then stop Start taking on: October 18, 2021   glyBURIDE 5 MG tablet Commonly known as: DIABETA TAKE 2 TABLETS BY MOUTH TWICE DAILY WITH A MEAL   metFORMIN 500 MG tablet Commonly known as: GLUCOPHAGE TAKE 2 TABLETS BY MOUTH IN THE MORNING AND 2 IN THE EVENING   metoprolol tartrate 25 MG tablet Commonly known as: LOPRESSOR Take 1 tablet  (25 mg total) by mouth 2 (two) times daily.   oxyCODONE 5 MG immediate release tablet Commonly known as: Oxy IR/ROXICODONE Take 1 tablet (5 mg total) by mouth every 6 (six) hours as needed for severe pain.   pioglitazone 45 MG tablet Commonly known as: ACTOS Take 1 tablet (45 mg total) by mouth daily.   potassium chloride 10 MEQ tablet Commonly known as: KLOR-CON M Take 1 tablet (10 mEq total) by mouth daily. For 5 days then stop   Trulicity 3.22 GU/5.4YH Sopn Generic drug: Dulaglutide Inject 0.75 mg into the skin once a week. What changed: when to take this         The patient has been discharged on:   1.Beta Blocker:  Yes [ x  ]                              No   [   ]                              If No, reason:  2.Ace Inhibitor/ARB: Yes [   ]                                     No  [  x  ]                                     If No, reason:Hope to start once BP allows after discharge  3.Statin:   Yes x[   ]                  No  [   ]                  If No, reason:  4.Ecasa:  Yes  [x   ]                  No   [   ]                  If No, reason:  Patient had ACS upon admission:No  Plavix/P2Y12 inhibitor: Yes [  x ]. Thrombus in native LAD  No  [   ]   Follow-up Information     Lajuana Matte, MD Follow up on 10/22/2021.   Specialty: Cardiothoracic Surgery Why: Your appointment is at 3:30 PM.  This will be a virtual visit.  Please do not go to the office.  Dr. Kipp Brood will call you at around 3:30 PM. Contact information: [address] [place] [address] [place], [place], NP. Call.   Specialty: Family Medicine Why: for a follow up regarding diabetes management and further surveillance of HGA1C 10 Contact information: [place] [address] [phone]         Deberah Pelton, NP. Go on 11/01/2021.   Specialty: Cardiology Why: Appointment  time is at 10:55 am Contact information: [place] [address] [phone]                 Signed: Arnoldo Lenis 10/17/2021, 10:15 AM

## 2021-10-15 NOTE — Progress Notes (Signed)
      [address]       [place] [address]             [phone]                 3 Days Post-Op Procedure(s) (LRB): CORONARY ARTERY BYPASS GRAFTING (CABG) x3 USING LEFT INTERNAL MAMMARY ARTERY AND LEFT RADIAL ARM GRAFT (N/A) RADIAL ARTERY HARVEST (Left) TRANSESOPHAGEAL ECHOCARDIOGRAM (TEE) (N/A)   Events: No events  _______________________________________________________________ Vitals: BP (!) 143/80 (BP Location: Right Arm)   Pulse 87   Temp 98.7 F (37.1 C) (Oral)   Resp (!) 23   Ht 5\' 7"  (1.702 m)   Wt 82.5 kg   SpO2 98%   BMI 28.49 kg/m  Filed Weights   10/13/21 0600 10/14/21 0600 10/15/21 0500  Weight: 84.5 kg 85.7 kg 82.5 kg     - Neuro: alert NAD  - Cardiovascular: sinus  Drips: none    - Pulm: EWOB   ABG    Component Value Date/Time   PHART 7.318 (L) 10/12/2021 2030   PCO2ART 44.2 10/12/2021 2030   PO2ART 88 10/12/2021 2030   HCO3 22.7 10/12/2021 2030   TCO2 24 10/12/2021 2030   ACIDBASEDEF 3.0 (H) 10/12/2021 2030   O2SAT 96 10/12/2021 2030    - Abd: ND - Extremity: trace edema  .Intake/Output      07/20 0701 07/21 0700 07/21 0701 07/22 0700   P.O. 360    I.V. (mL/kg) 7 (0.1)    IV Piggyback 200    Total Intake(mL/kg) 567 (6.9)    Urine (mL/kg/hr) 800 (0.4)    Emesis/NG output     Stool     Chest Tube     Total Output 800    Net -233.1         Urine Occurrence 851 x       _______________________________________________________________ Labs:    Latest Ref Rng & Units 10/14/2021    3:40 AM 10/13/2021    3:24 AM 10/12/2021    8:30 PM  CBC  WBC 4.0 - 10.5 K/uL 9.1  9.2    Hemoglobin 13.0 - 17.0 g/dL 10/14/2021  91.4  78.2   Hematocrit 39.0 - 52.0 % 33.5  33.7  32.0   Platelets 150 - 400 K/uL 124  133        Latest Ref Rng & Units 10/14/2021    3:40 AM 10/13/2021    3:24 AM 10/12/2021    8:30 PM  CMP  Glucose 70 - 99 mg/dL 10/14/2021  213    BUN 8 - 23 mg/dL 15  20    Creatinine 086 - 1.24 mg/dL 5.78  4.69     Sodium 6.29 - 145 mmol/L 136  139  144   Potassium 3.5 - 5.1 mmol/L 3.9  4.0  3.9   Chloride 98 - 111 mmol/L 104  108    CO2 22 - 32 mmol/L 25  22    Calcium 8.9 - 10.3 mg/dL 8.4  8.6      CXR: - _______________________________________________________________  Assessment and Plan: POD 3 s/p CABG  Neuro: pain controlled CV: on a/s/bb.  On plavix for LAD thrombus.   Pulm: IS ambulation Renal: diuresis today GI: on diet Heme: stable ID: afebrile Endo: SSI Dispo: floor.  Awaiting bed   528 10/15/2021 7:45 AM

## 2021-10-15 NOTE — Plan of Care (Signed)
  Problem: Activity: Goal: Ability to return to baseline activity level will improve Outcome: Progressing   Problem: Cardiovascular: Goal: Ability to achieve and maintain adequate cardiovascular perfusion will improve Outcome: Progressing   Problem: Health Behavior/Discharge Planning: Goal: Ability to safely manage health-related needs after discharge will improve Outcome: Progressing   Problem: Education: Goal: Knowledge of General Education information will improve Description: Including pain rating scale, medication(s)/side effects and non-pharmacologic comfort measures Outcome: Progressing   Problem: Clinical Measurements: Goal: Will remain free from infection Outcome: Progressing

## 2021-10-15 NOTE — Plan of Care (Signed)
Pt a/o x4. Ambulates with 1 assist, IS progressing. Cough and deep breathing done. Education was provided, emotional support given. Family updated. Transfer orders are in.  Bed in low position, alarms are on, call bell in reach, continue to monitor

## 2021-10-15 NOTE — Progress Notes (Addendum)
CARDIAC REHAB PHASE I   PRE:  Rate/Rhythm: 88 NSR  BP:  Sitting: 111/76      SaO2: 96 RA  MODE:  Ambulation: 290 ft   POST:  Rate/Rhythm: 100 ST   BP:  Sitting: 139/90      SaO2: 96 RA   Seen pt from [phone] pt was received from chair following sternal precautions and ambulated through hallways with RW and standby assistance. During ambulation pt complained of chest soreness 2/10 and was returned to bed w/o further complaints. Pt was educated on restrictions, heart healthy and diabetic diet, ex guidelines, importance of IS use, and CRPII. Pt is being referred to CRPII at [facility].    Faustino Congress  8:48 AM 10/15/2021

## 2021-10-16 ENCOUNTER — Inpatient Hospital Stay (HOSPITAL_COMMUNITY): Payer: 59

## 2021-10-16 LAB — GLUCOSE, CAPILLARY
Glucose-Capillary: 159 mg/dL — ABNORMAL HIGH (ref 70–99)
Glucose-Capillary: 160 mg/dL — ABNORMAL HIGH (ref 70–99)
Glucose-Capillary: 173 mg/dL — ABNORMAL HIGH (ref 70–99)
Glucose-Capillary: 186 mg/dL — ABNORMAL HIGH (ref 70–99)

## 2021-10-16 MED ORDER — GLYBURIDE 5 MG PO TABS
5.0000 mg | ORAL_TABLET | Freq: Two times a day (BID) | ORAL | Status: DC
  Administered 2021-10-16 – 2021-10-17 (×3): 5 mg via ORAL
  Filled 2021-10-16 (×3): qty 1

## 2021-10-16 MED ORDER — METOPROLOL TARTRATE 25 MG PO TABS
25.0000 mg | ORAL_TABLET | Freq: Two times a day (BID) | ORAL | Status: DC
  Administered 2021-10-16 – 2021-10-17 (×3): 25 mg via ORAL
  Filled 2021-10-16 (×3): qty 1

## 2021-10-16 NOTE — Progress Notes (Signed)
Mobility Specialist Progress Note    10/16/21 1717  Mobility  Activity Ambulated independently in hallway  Level of Assistance Modified independent, requires aide device or extra time  Assistive Device Front wheel walker  Distance Ambulated (ft) 400 ft  Activity Response Tolerated well  $Mobility charge 1 Mobility   Pre-Mobility: 103 HR During Mobility: 116 HR Post-Mobility: 110 HR  Pt received in bed and agreeable. No complaints on walk. Returned to sitting EOB with call bell in reach and RN present.    [place] Mobility Specialist

## 2021-10-16 NOTE — Progress Notes (Signed)
   10/15/21 2031  Vitals  Temp 98.4 F (36.9 C)  Temp Source Oral  BP (!) 130/91  MAP (mmHg) 102  BP Location Right Arm  BP Method Automatic  Patient Position (if appropriate) Sitting  Pulse Rate 100  Pulse Rate Source Monitor  ECG Heart Rate 100  Resp 18  Level of Consciousness  Level of Consciousness Alert  MEWS COLOR  MEWS Score Color Green  Oxygen Therapy  SpO2 97 %  O2 Device Room Air  MEWS Score  MEWS Temp 0  MEWS Systolic 0  MEWS Pulse 0  MEWS RR 0  MEWS LOC 0  MEWS Score 0   Pt transferred from 2H to 4E. Pt oriented to unit. CCMD called.Pt denies pain. CHG bath given. Call light by pt's side. RN will continue to monitor pt.

## 2021-10-16 NOTE — Progress Notes (Signed)
CARDIAC REHAB PHASE I   PRE:  Rate/Rhythm: NSR/93  BP:  Sitting: 134/86        SaO2: 95  MODE:  Ambulation: 470 ft   POST:  Rate/Rhythm: NSR/96  BP:  Sitting: 136/90      SaO2: 96%  Pt received in bed. Pt agrees to ambulate. Pt pulled 1500 mL on I/S. Encouraged 10 x/hr. Sternal precautions observed. Pt ambulated with steady gait using rolling walker; stand by assistance. Pt had no c/o SOB or dizziness. Pt did voice 2/10 sternal pain. Pt back to bedside with wife in room. Pt and spouse have no questions regarding the OHS/CABG  d/c education. Pt still has not had a BM.   Lorin Picket, MS, ACSM EP-C, [facility] 10/16/2021 [phone] - [phone]

## 2021-10-16 NOTE — Progress Notes (Addendum)
      [address]       [place] [address]             [phone]        4 Days Post-Op Procedure(s) (LRB): CORONARY ARTERY BYPASS GRAFTING (CABG) x3 USING LEFT INTERNAL MAMMARY ARTERY AND LEFT RADIAL ARM GRAFT (N/A) RADIAL ARTERY HARVEST (Left) TRANSESOPHAGEAL ECHOCARDIOGRAM (TEE) (N/A)  Subjective: Patient passing flatus but no bowel movement yet  Objective: Vital signs in last 24 hours: Temp:  [97.8 F (36.6 C)-98.6 F (37 C)] 98.6 F (37 C) (07/22 0423) Pulse Rate:  [83-100] 84 (07/22 0423) Cardiac Rhythm: Normal sinus rhythm;Other (Comment) (07/21 2043) Resp:  [13-21] 16 (07/22 0423) BP: (108-149)/(66-91) 110/66 (07/22 0423) SpO2:  [93 %-100 %] 99 % (07/22 0423) Weight:  [79.5 kg] 79.5 kg (07/22 0500)  Pre op weight 81.6 kg Current Weight  10/16/21 79.5 kg       Intake/Output from previous day: 07/21 0701 - 07/22 0700 In: 120 [P.O.:120] Out: 750 [Urine:750]   Physical Exam:  Cardiovascular: RRR Pulmonary:Slightly diminished bibasilar breath sounds Abdomen: Soft, non tender, bowel sounds present. Extremities: Mild bilateral lower extremity edema. LUE motor/sensory intact Wounds: Sternal, LUE, and RLE wounds are clean and dry.  No erythema or signs of infection.  Lab Results: CBC: Recent Labs    10/14/21 0340 10/15/21 0639  WBC 9.1 7.9  HGB 11.0* 12.2*  HCT 33.5* 36.8*  PLT 124* 138*   BMET:  Recent Labs    10/14/21 0340 10/15/21 0639  NA 136 136  K 3.9 3.8  CL 104 104  CO2 25 20*  GLUCOSE 167* 156*  BUN 15 15  CREATININE 1.16 1.24  CALCIUM 8.4* 8.7*    PT/INR:  Lab Results  Component Value Date   INR 1.3 (H) 10/12/2021   INR 1.1 10/11/2021   INR 0.9 12/04/2019   ABG:  INR: Will add last result for INR, ABG once components are confirmed Will add last 4 CBG results once components are confirmed  Assessment/Plan:  1. CV - SR. On Lopressor 12.5 mg bid, Amlodipine 2.5 mg daily (RA conduit), and Plavix (LAD  thrombus). Will increase Lopressor  2.  Pulmonary - On room air. Check PA/LAT CXR. Encourage incentive spirometer. 3. Volume Overload - On Lasix 40 mg daily 4.  Expected post op acute blood loss anemia - Last H and H stable at 12.2 and 36.8 5. DM-CBGs 236/175/159. On Insulin and will restart Glyburide today. Will then restart Trulicity, Actos and Metformin at discharge. Pre op HGA1C 10. He will need close medical follow up after discharge 6. Mild thrombocytopenia-last platelets up to 138,000 7. Hopefully, home in am  Donielle M ZimmermanPA-C 7:40 AM   DC instructions reviewed withpatient patient examined and medical record reviewed,agree with above note. Lovett Sox 10/16/2021

## 2021-10-17 LAB — BASIC METABOLIC PANEL
Anion gap: 8 (ref 5–15)
BUN: 22 mg/dL (ref 8–23)
CO2: 23 mmol/L (ref 22–32)
Calcium: 9.1 mg/dL (ref 8.9–10.3)
Chloride: 105 mmol/L (ref 98–111)
Creatinine, Ser: 1.29 mg/dL — ABNORMAL HIGH (ref 0.61–1.24)
GFR, Estimated: >60 mL/min (ref >60)
Glucose, Bld: 157 mg/dL — ABNORMAL HIGH (ref 70–99)
Potassium: 3.7 mmol/L (ref 3.5–5.1)
Sodium: 136 mmol/L (ref 135–145)

## 2021-10-17 LAB — GLUCOSE, CAPILLARY: Glucose-Capillary: 132 mg/dL — ABNORMAL HIGH (ref 70–99)

## 2021-10-17 MED ORDER — AMLODIPINE BESYLATE 2.5 MG PO TABS: 2.5000 mg | ORAL_TABLET | Freq: Every day | ORAL | 0 refills | Status: DC

## 2021-10-17 MED ORDER — POTASSIUM CHLORIDE CRYS ER 20 MEQ PO TBCR
30.0000 meq | EXTENDED_RELEASE_TABLET | Freq: Two times a day (BID) | ORAL | Status: DC
  Administered 2021-10-17: 30 meq via ORAL
  Filled 2021-10-17: qty 1

## 2021-10-17 MED ORDER — POTASSIUM CHLORIDE CRYS ER 10 MEQ PO TBCR: 10.0000 meq | EXTENDED_RELEASE_TABLET | Freq: Every day | ORAL | 0 refills | Status: DC

## 2021-10-17 MED ORDER — CLOPIDOGREL BISULFATE 75 MG PO TABS: 75.0000 mg | ORAL_TABLET | Freq: Every day | ORAL | 1 refills | Status: DC

## 2021-10-17 MED ORDER — OXYCODONE HCL 5 MG PO TABS
5.0000 mg | ORAL_TABLET | Freq: Four times a day (QID) | ORAL | 0 refills | Status: DC | PRN
Start: 2021-10-17 — End: 2022-01-31

## 2021-10-17 MED ORDER — FUROSEMIDE 40 MG PO TABS: 40.0000 mg | ORAL_TABLET | Freq: Every day | ORAL | 0 refills | Status: DC

## 2021-10-17 NOTE — Plan of Care (Signed)
  Problem: Education: Goal: Understanding of CV disease, CV risk reduction, and recovery process will improve Outcome: Adequate for Discharge Goal: Individualized Educational Video(s) Outcome: Adequate for Discharge   Problem: Activity: Goal: Ability to return to baseline activity level will improve Outcome: Adequate for Discharge   Problem: Cardiovascular: Goal: Ability to achieve and maintain adequate cardiovascular perfusion will improve Outcome: Adequate for Discharge Goal: Vascular access site(s) Level 0-1 will be maintained Outcome: Adequate for Discharge   Problem: Health Behavior/Discharge Planning: Goal: Ability to safely manage health-related needs after discharge will improve Outcome: Adequate for Discharge   Problem: Education: Goal: Knowledge of General Education information will improve Description: Including pain rating scale, medication(s)/side effects and non-pharmacologic comfort measures Outcome: Adequate for Discharge   Problem: Health Behavior/Discharge Planning: Goal: Ability to manage health-related needs will improve Outcome: Adequate for Discharge   Problem: Clinical Measurements: Goal: Ability to maintain clinical measurements within normal limits will improve Outcome: Adequate for Discharge Goal: Will remain free from infection Outcome: Adequate for Discharge Goal: Diagnostic test results will improve Outcome: Adequate for Discharge Goal: Respiratory complications will improve Outcome: Adequate for Discharge Goal: Cardiovascular complication will be avoided Outcome: Adequate for Discharge   Problem: Activity: Goal: Risk for activity intolerance will decrease Outcome: Adequate for Discharge   Problem: Nutrition: Goal: Adequate nutrition will be maintained Outcome: Adequate for Discharge   Problem: Coping: Goal: Level of anxiety will decrease Outcome: Adequate for Discharge   Problem: Elimination: Goal: Will not experience complications  related to bowel motility Outcome: Adequate for Discharge Goal: Will not experience complications related to urinary retention Outcome: Adequate for Discharge   Problem: Pain Managment: Goal: General experience of comfort will improve Outcome: Adequate for Discharge   Problem: Safety: Goal: Ability to remain free from injury will improve Outcome: Adequate for Discharge   Problem: Skin Integrity: Goal: Risk for impaired skin integrity will decrease Outcome: Adequate for Discharge   Problem: Education: Goal: Will demonstrate proper wound care and an understanding of methods to prevent future damage Outcome: Adequate for Discharge Goal: Knowledge of disease or condition will improve Outcome: Adequate for Discharge Goal: Knowledge of the prescribed therapeutic regimen will improve Outcome: Adequate for Discharge Goal: Individualized Educational Video(s) Outcome: Adequate for Discharge   Problem: Activity: Goal: Risk for activity intolerance will decrease Outcome: Adequate for Discharge   Problem: Cardiac: Goal: Will achieve and/or maintain hemodynamic stability Outcome: Adequate for Discharge   Problem: Clinical Measurements: Goal: Postoperative complications will be avoided or minimized Outcome: Adequate for Discharge   Problem: Respiratory: Goal: Respiratory status will improve Outcome: Adequate for Discharge   Problem: Skin Integrity: Goal: Wound healing without signs and symptoms of infection Outcome: Adequate for Discharge Goal: Risk for impaired skin integrity will decrease Outcome: Adequate for Discharge   Problem: Urinary Elimination: Goal: Ability to achieve and maintain adequate renal perfusion and functioning will improve Outcome: Adequate for Discharge   

## 2021-10-17 NOTE — Progress Notes (Addendum)
      [address]       [place] [address]             [phone]        5 Days Post-Op Procedure(s) (LRB): CORONARY ARTERY BYPASS GRAFTING (CABG) x3 USING LEFT INTERNAL MAMMARY ARTERY AND LEFT RADIAL ARM GRAFT (N/A) RADIAL ARTERY HARVEST (Left) TRANSESOPHAGEAL ECHOCARDIOGRAM (TEE) (N/A)  Subjective: Patient has moved bowels. He has no complaint this am and wants to go home.  Objective: Vital signs in last 24 hours: Temp:  [98.2 F (36.8 C)-98.5 F (36.9 C)] 98.2 F (36.8 C) (07/23 0411) Pulse Rate:  [81-98] 81 (07/23 0411) Cardiac Rhythm: Normal sinus rhythm (07/22 1904) Resp:  [16-20] 16 (07/23 0411) BP: (111-127)/(72-86) 119/77 (07/23 0411) SpO2:  [94 %-98 %] 96 % (07/23 0411) Weight:  [72.5 kg] 72.5 kg (07/23 0411)  Pre op weight 81.6 kg Current Weight  10/17/21 72.5 kg       Intake/Output from previous day: 07/22 0701 - 07/23 0700 In: 360 [P.O.:360] Out: -    Physical Exam:  Cardiovascular: RRR Pulmonary:Slightly diminished bibasilar breath sounds Abdomen: Soft, non tender, bowel sounds present. Extremities: Mild bilateral lower extremity edema. LUE motor/sensory intact Wounds: Sternal, LUE, and RLE wounds are clean and dry.  No erythema or signs of infection.  Lab Results: CBC: Recent Labs    10/15/21 0639  WBC 7.9  HGB 12.2*  HCT 36.8*  PLT 138*    BMET:  Recent Labs    10/15/21 0639 10/17/21 0116  NA 136 136  K 3.8 3.7  CL 104 105  CO2 20* 23  GLUCOSE 156* 157*  BUN 15 22  CREATININE 1.24 1.29*  CALCIUM 8.7* 9.1     PT/INR:  Lab Results  Component Value Date   INR 1.3 (H) 10/12/2021   INR 1.1 10/11/2021   INR 0.9 12/04/2019   ABG:  INR: Will add last result for INR, ABG once components are confirmed Will add last 4 CBG results once components are confirmed  Assessment/Plan:  1. CV - SR. On Lopressor 25mg  bid, Amlodipine 2.5 mg daily (RA conduit), and Plavix (thrombus in native LAD).  2.  Pulmonary  - On room air. Encourage incentive spirometer. 3. Volume Overload - On Lasix 40 mg daily 4.  Expected post op acute blood loss anemia - Last H and H stable at 12.2 and 36.8 5. DM-CBGs 173/186/132. On Insulin and will restart Glyburide today. Will then restart Trulicity, Actos and Metformin at discharge. Pre op HGA1C 10. He will need close medical follow up after discharge 6. Mild thrombocytopenia-last platelets up to 138,000 7. Creatinine slightly increased from 1.24 to 1.29. 8. Supplement potassium 9. As discussed with Dr. , discharge  Donielle M ZimmermanPA-C 8:10 AM Rhythm recordings reviewed showing sinus rhythm Patient ready for discharge-discharge instructions discussed with patient.  patient examined and medical record reviewed,agree with above note. Donata Clay 10/17/2021

## 2021-10-17 NOTE — Progress Notes (Signed)
Patient given discharge instructions and stated understanding. 

## 2021-10-22 ENCOUNTER — Encounter: Payer: Self-pay | Admitting: Thoracic Surgery (Cardiothoracic Vascular Surgery)

## 2021-10-22 NOTE — Progress Notes (Deleted)
     [address]       [place] [address]             [phone]       Patient: Home Provider: Office Consent for Telemedicine visit obtained.  Today's visit was completed via a real-time telehealth (see specific modality noted below). The patient/authorized person provided oral consent at the time of the visit to engage in a telemedicine encounter with the present provider at [facility]. The patient/authorized person was informed of the potential benefits, limitations, and risks of telemedicine. The patient/authorized person expressed understanding that the laws that protect confidentiality also apply to telemedicine. The patient/authorized person acknowledged understanding that telemedicine does not provide emergency services and that he or she would need to call 911 or proceed to the nearest hospital for help if such a need arose.   Total time spent in the clinical discussion 10 minutes.  Telehealth Modality: Phone visit (audio only)  I had a telephone visit with  [name] who is s/p Cabg.  Overall doing well.   Pain is minimal.  Ambulating well. Vitals have been stable.  Koleman L Shinsato will see [place] back in 1 month with a chest x-ray for cardiac rehab clearance.  Maral Lampe Keane Scrape

## 2021-10-24 ENCOUNTER — Other Ambulatory Visit: Payer: Self-pay | Admitting: Nurse Practitioner

## 2021-10-24 DIAGNOSIS — I25119 Atherosclerotic heart disease of native coronary artery with unspecified angina pectoris: Secondary | ICD-10-CM

## 2021-10-29 NOTE — Progress Notes (Signed)
Cardiology Clinic Note   Patient Name: Mario Arnold Date of Encounter: 11/01/2021  Primary Care Provider:  Ronnell Freshwater, NP Primary Cardiologist:  Peter Martinique, MD  Patient Profile    Mario Arnold 66 year old male presents to the clinic today for follow-up evaluation of his coronary artery disease.  Status post CABG x3 on 10/13/2021.  Past Medical History    Past Medical History:  Diagnosis Date   Arthritis    Carotid disease, bilateral ([place]) 08/2011   less than 50% stenosis in the right and left    Chronic kidney disease 2017   stones   Coronary artery disease    STATUS POST ANGIOPLASTY OF THE LAD    Diabetes mellitus    TYPE 2   Dyslipidemia    History of kidney stones    History of transient ischemic attack (TIA)    on aggrenox   Hyperlipidemia    Myocardial infarction [facility]) 1999   stent placement- per patient has 2 stents   Stroke [facility])    Thyroid nodule 08/2011   had [place] with 4 mm small cystic nodule noted. Needs follow up US in 12 months. TSH is normal.    Past Surgical History:  Procedure Laterality Date   COLONOSCOPY     13 years ago in [facility] normal exam per pt   CORONARY ANGIOPLASTY WITH STENT PLACEMENT  03/28/2001   LAD. EJECTION FRACTION IS 45%   CORONARY ARTERY BYPASS GRAFT N/A 10/12/2021   Procedure: CORONARY ARTERY BYPASS GRAFTING (CABG) x3 USING LEFT INTERNAL MAMMARY ARTERY AND LEFT RADIAL ARM GRAFT;  Surgeon: Lajuana Matte, MD;  Location: [place];  Service: Open Heart Surgery;  Laterality: N/A;   INTRAVASCULAR PRESSURE WIRE/FFR STUDY N/A 09/24/2021   Procedure: INTRAVASCULAR PRESSURE WIRE/FFR STUDY;  Surgeon: Martinique, Peter M, MD;  Location: [place] CV LAB;  Service: Cardiovascular;  Laterality: N/A;   LEFT HEART CATH AND CORONARY ANGIOGRAPHY N/A 09/24/2021   Procedure: LEFT HEART CATH AND CORONARY ANGIOGRAPHY;  Surgeon: Martinique, Peter M, MD;  Location: [place] CV LAB;  Service: Cardiovascular;  Laterality: N/A;   RADIAL ARTERY  HARVEST Left 10/12/2021   Procedure: RADIAL ARTERY HARVEST;  Surgeon: Lajuana Matte, MD;  Location: [place];  Service: Open Heart Surgery;  Laterality: Left;   SHOULDER ARTHROSCOPY WITH OPEN ROTATOR CUFF REPAIR AND DISTAL CLAVICLE ACROMINECTOMY Right 09/05/2012   Procedure: RIGHT SHOULDER ARTHROSCOPY WITH LABRAL DEBRIDEMENT AND OPEN DISTAL CLAVICLE RESECTION, acromionectomy  AND OPEN ROTATOR CUFF REPAIR;  Surgeon: Magnus Sinning, MD;  Location: [facility];  Service: Orthopedics;  Laterality: Right;   TEE WITHOUT CARDIOVERSION N/A 10/12/2021   Procedure: TRANSESOPHAGEAL ECHOCARDIOGRAM (TEE);  Surgeon: Lajuana Matte, MD;  Location: [place];  Service: Open Heart Surgery;  Laterality: N/A;    Allergies  Allergies  Allergen Reactions   Nsaids Other (See Comments)    Aleve* bad on Kidneys    Dilaudid [Hydromorphone] Nausea And Vomiting    History of Present Illness    Mario Arnold has a PMH of coronary artery disease, bilateral carotid disease, hypertension, type 2 diabetes, thyroid nodule, hyperlipidemia, right hand joint pain, and is status post CABG x3 on 10/12/2021.  He was undergoing evaluation for spinal surgery.  He was noted to have anginal symptoms.  He underwent cardiac catheterization which showed three-vessel coronary disease.  He was noted to have in-stent restenosis of his LAD.  He was seen and evaluated by cardiothoracic surgery.  He was admitted to  the hospital on 10/12/2021 and discharged on 10/17/2021.  He received CABG x3.  He progressed well postoperatively and was scheduled for transfer to 4 E. on postop day 2.  He progressed with his mobility.  His surgical wounds were healing well without signs of complication.  He ambulated on room air.  His diet was advanced which she tolerated well.  He was discharged in stable condition on 10/17/2021.  He presents to the clinic today for follow-up evaluation states he is walking regularly.  He does notice some sternal numbness around  his incision.  He also notices some numbness along his left thumb.  We reviewed his hospitalization and current medications.  He and his wife expressed understanding.  I will have him maintain sternal precautions, continue to increase his physical activity as tolerated, and maintain a blood pressure log.  We will plan follow-up in 3 to 4 months.  His left radial incision is healing well, sternal incision is healing well, and there are no signs of infection.  Today he denies chest pain, shortness of breath, lower extremity edema, fatigue, palpitations, melena, hematuria, hemoptysis, diaphoresis, weakness, presyncope, syncope, orthopnea, and PND.    Home Medications    Prior to Admission medications   Medication Sig Start Date End Date Taking? Authorizing Provider  acetaminophen (TYLENOL) 500 MG tablet Take 1,000 mg by mouth every 8 (eight) hours as needed for mild pain.    [provider]  amLODipine (NORVASC) 2.5 MG tablet Take 1 tablet (2.5 mg total) by mouth daily. For one month then stop. 10/18/21   Nani Skillern, PA-C  aspirin EC 81 MG tablet Take 1 tablet (81 mg total) by mouth daily. Swallow whole. 10/01/20   Martinique, Peter M, MD  atorvastatin (LIPITOR) 80 MG tablet Take 1 tablet by mouth once daily 10/04/21   Ronnell Freshwater, NP  blood glucose meter kit and supplies Dispense based on patient and insurance preference. Use up to four times daily as directed. (FOR ICD-10 E10.9, E11.9). 09/29/20   Ronnell Freshwater, NP  clopidogrel (PLAVIX) 75 MG tablet Take 1 tablet (75 mg total) by mouth daily. 10/18/21   Nani Skillern, PA-C  Dulaglutide (TRULICITY) 1.57 WI/2.0BT SOPN Inject 0.75 mg into the skin once a week. Patient taking differently: Inject 0.75 mg into the skin every Friday. 09/27/21   Ronnell Freshwater, NP  ezetimibe (ZETIA) 10 MG tablet Take 1 tablet (10 mg total) by mouth every evening. 09/29/20   Ronnell Freshwater, NP  fenofibrate 160 MG tablet Take 1 tablet (160 mg  total) by mouth daily. 09/29/20   Ronnell Freshwater, NP  furosemide (LASIX) 40 MG tablet Take 1 tablet (40 mg total) by mouth daily. For 5 days then stop 10/18/21   Lars Pinks M, PA-C  glyBURIDE (DIABETA) 5 MG tablet TAKE 2 TABLETS BY MOUTH TWICE DAILY WITH A MEAL 07/15/21   Ronnell Freshwater, NP  metFORMIN (GLUCOPHAGE) 500 MG tablet TAKE 2 TABLETS BY MOUTH IN THE MORNING AND 2 IN THE EVENING 09/27/21   Martinique, Peter M, MD  metoprolol tartrate (LOPRESSOR) 25 MG tablet Take 1 tablet (25 mg total) by mouth 2 (two) times daily. 09/29/20   Ronnell Freshwater, NP  oxyCODONE (OXY IR/ROXICODONE) 5 MG immediate release tablet Take 1 tablet (5 mg total) by mouth every 6 (six) hours as needed for severe pain. 10/17/21   Nani Skillern, PA-C  pioglitazone (ACTOS) 45 MG tablet Take 1 tablet (45 mg total) by mouth  daily. 08/11/21   Ronnell Freshwater, NP  potassium chloride (KLOR-CON M) 10 MEQ tablet Take 1 tablet (10 mEq total) by mouth daily. For 5 days then stop 10/17/21   Nani Skillern, PA-C    Family History    Family History  Problem Relation Age of Onset   Coronary artery disease Mother    Heart attack Mother    Diabetes Mother    Transient ischemic attack Mother    Heart disease Mother    Cancer Father    Colon cancer Neg Hx    Esophageal cancer Neg Hx    Colon polyps Neg Hx    Rectal cancer Neg Hx    Stomach cancer Neg Hx    He indicated that his mother is deceased. He indicated that his father is deceased. He indicated that both of his sisters are alive. He indicated that both of his brothers are alive. He indicated that the status of his neg hx is unknown.  Social History    Social History   Socioeconomic History   Marital status: Married    Spouse name: Ramy Greth   Number of children: 2   Years of education: Not on file   Highest education level: Not on file  Occupational History   Occupation: Repair semi trailers.  Tobacco Use   Smoking status: Former     Packs/day: 1.50    Years: 23.00    Total pack years: 34.50    Types: Cigarettes    Quit date: 03/28/1997    Years since quitting: 24.6   Smokeless tobacco: Former    Types: Snuff, Chew   Tobacco comments:    as a teen  Vaping Use   Vaping Use: Never used  Substance and Sexual Activity   Alcohol use: Yes    Alcohol/week: 2.0 standard drinks of alcohol    Types: 2 Cans of beer per week    Comment: socially   Drug use: No   Sexual activity: Not Currently  Other Topics Concern   Not on file  Social History Narrative   Not on file   Social Determinants of Health   Financial Resource Strain: Not on file  Food Insecurity: Not on file  Transportation Needs: Not on file  Physical Activity: Not on file  Stress: Not on file  Social Connections: Not on file  Intimate Partner Violence: Not on file     Review of Systems    General:  No chills, fever, night sweats or weight changes.  Cardiovascular:  No chest pain, dyspnea on exertion, edema, orthopnea, palpitations, paroxysmal nocturnal dyspnea. Dermatological: No rash, lesions/masses Respiratory: No cough, dyspnea Urologic: No hematuria, dysuria Abdominal:   No nausea, vomiting, diarrhea, bright red blood per rectum, melena, or hematemesis Neurologic:  No visual changes, wkns, changes in mental status. All other systems reviewed and are otherwise negative except as noted above.  Physical Exam    VS:  BP 106/68 (BP Location: Right Arm, Patient Position: Sitting, Cuff Size: Normal)   Pulse 76   Ht _0  (1.702 m)   Wt 177 lb (80.3 kg)   SpO2 94%   BMI 27.72 kg/m  , BMI Body mass index is 27.72 kg/m. GEN: Well nourished, well developed, in no acute distress. HEENT: normal. Neck: Supple, no JVD, carotid bruits, or masses. Cardiac: RRR, no murmurs, rubs, or gallops. No clubbing, cyanosis, generalized right lower extremity nonpitting edema.  Radials/DP/PT 2+ and equal bilaterally.  Respiratory:  Respirations regular and  unlabored,  clear to auscultation bilaterally. GI: Soft, nontender, nondistended, BS + x 4. MS: no deformity or atrophy. Skin: warm and dry, no rash.  Left radial incision healing well, sternal incision healing well.  Right leg incision healing well.  No signs of infection. Neuro:  Strength and sensation are intact. Psych: Normal affect.  Accessory Clinical Findings    Recent Labs: 10/11/2021: ALT 31 10/13/2021: Magnesium 2.3 10/15/2021: Hemoglobin 12.2; Platelets 138 10/17/2021: BUN 22; Creatinine, Ser 1.29; Potassium 3.7; Sodium 136   Recent Lipid Panel    Component Value Date/Time   CHOL 117 09/23/2021 0915   TRIG 107 09/23/2021 0915   HDL 30 (L) 09/23/2021 0915   CHOLHDL 3.9 09/23/2021 0915   CHOLHDL 4 05/07/2013 0855   VLDL 47.0 (H) 05/07/2013 0855   LDLCALC 67 09/23/2021 0915   LDLDIRECT 55.5 05/07/2013 0855    ECG personally reviewed by me today-normal sinus rhythm left axis deviation septal infarct undetermined age possible lateral infarct undetermined age 53 bpm- No acute changes  Echocardiogram 09/24/2021 IMPRESSIONS     1. Apical, mid and basal inferior wall hypokinesis . Left ventricular  ejection fraction, by estimation, is 45 to 50%. The left ventricle has  mildly decreased function. The left ventricle demonstrates regional wall  motion abnormalities (see scoring  diagram/findings for description). Left ventricular diastolic parameters  were normal.   2. Right ventricular systolic function is normal. The right ventricular  size is normal.   3. Left atrial size was mildly dilated.   4. The mitral valve is normal in structure. No evidence of mitral valve  regurgitation. No evidence of mitral stenosis.   5. The aortic valve is tricuspid. There is mild calcification of the  aortic valve. There is mild thickening of the aortic valve. Aortic valve  regurgitation is not visualized. Aortic valve sclerosis is present, with  no evidence of aortic valve stenosis.   6.  The inferior vena cava is normal in size with greater than 50%  respiratory variability, suggesting right atrial pressure of 3 mmHg.  Cardiac catheterization 09/24/2021    Prox RCA to Mid RCA lesion is 45% stenosed.   Dist RCA lesion is 85% stenosed.   RPDA lesion is 70% stenosed.   Dist Cx lesion is 60% stenosed.   Ramus lesion is 70% stenosed.   Prox LAD to Mid LAD lesion is 70% stenosed.   Mid LAD lesion is 60% stenosed.   LV end diastolic pressure is normal.   Severe 3 vessel obstructive CAD. Long segmental disease in the proximal to mid LAD with abnormal RFR, 70% ramus intermediate, 85% distal RCA and 70% PDA. Normal LVEDP   Given diabetes and diffuse disease would recommend referral for CABG.  Diagnostic Dominance: Right  Intervention   Assessment & Plan   1.  Coronary artery disease-status post CABG x3 on 10/12/2021.  He continues to increase his physical activity slowly.  He is walking regularly.  He is maintaining sternal precautions.  Denies accelerated or irregular heartbeat. Continue amlodipine, atorvastatin, aspirin, Plavix, ezetimibe, metoprolol Heart healthy low-sodium diet-salty 6 given Increase physical activity as tolerated-maintain sternal precautions  Hyperlipidemia-LDL 67  Continue ezetimibe, atorvastatin Heart healthy low-sodium high-fiber diet Repeat fasting lipids and LFTs in 4-6 weeks.  Essential hypertension-BP today 106/68 Continue amlodipine, metoprolol Heart healthy low-sodium diet-salty 6 given   Type 2 diabetes- A1c 10 on 10/11/21 Continue metformin, Actos Follows with PCP  Disposition: Follow-up with Dr. Kipp Brood as scheduled and Dr. Martinique or APP in 3-4 months.  Jossie Ng. Elijah Phommachanh NP-C     11/01/2021, 11:21 AM [place] [address] Office [phone] Fax [phone]  Notice: This dictation was prepared with Dragon dictation along with smaller phrase technology. Any transcriptional  errors that result from this process are unintentional and may not be corrected upon review.  I spent 13 minutes examining this patient, reviewing medications, and using patient centered shared decision making involving her cardiac care.  Prior to her visit I spent greater than 20 minutes reviewing her past medical history,  medications, and prior cardiac tests.

## 2021-11-01 ENCOUNTER — Encounter: Payer: Self-pay | Admitting: General Practice

## 2021-11-01 ENCOUNTER — Ambulatory Visit (INDEPENDENT_AMBULATORY_CARE_PROVIDER_SITE_OTHER): Payer: 59 | Admitting: General Practice

## 2021-11-01 ENCOUNTER — Telehealth: Payer: Self-pay | Admitting: *Deleted

## 2021-11-01 VITALS — BP 106/68 | HR 76 | Ht 67.0 in | Wt 177.0 lb

## 2021-11-01 DIAGNOSIS — E119 Type 2 diabetes mellitus without complications: Secondary | ICD-10-CM | POA: Diagnosis not present

## 2021-11-01 DIAGNOSIS — I10 Essential (primary) hypertension: Secondary | ICD-10-CM | POA: Diagnosis not present

## 2021-11-01 DIAGNOSIS — I25118 Atherosclerotic heart disease of native coronary artery with other forms of angina pectoris: Secondary | ICD-10-CM

## 2021-11-01 DIAGNOSIS — E785 Hyperlipidemia, unspecified: Secondary | ICD-10-CM

## 2021-11-01 NOTE — Patient Instructions (Signed)
Medication Instructions:  The current medical regimen is effective;  continue present plan and medications as directed. Please refer to the Current Medication list given to you today.   *If you need a refill on your cardiac medications before your next appointment, please call your pharmacy*  Lab Work:   Testing/Procedures:  NONE    NONE  If you have labs (blood work) drawn today and your tests are completely normal, you will receive your results only by: MyChart Message (if you have MyChart) OR  A paper copy in the mail If you have any lab test that is abnormal or we need to change your treatment, we will call you to review the results.  Special Instructions PLEASE READ AND FOLLOW SALTY 6-ATTACHED-1,800mg  daily  MAINTAIN STERNAL PRECAUTIONS  Follow-Up: Your next appointment:  3-4 month(s) In Person with Peter [place], MD   At [facility], you and your health needs are our priority.  As part of our continuing mission to provide you with exceptional heart care, we have created designated Provider Care Teams.  These Care Teams include your primary Cardiologist (physician) and Advanced Practice Providers (APPs -  Physician Assistants and Nurse Practitioners) who all work together to provide you with the care you need, when you need it.  Important Information About Sugar             6 SALTY THINGS TO AVOID     1,800MG  DAILY

## 2021-11-02 ENCOUNTER — Other Ambulatory Visit: Payer: Self-pay | Admitting: Specialist

## 2021-11-11 ENCOUNTER — Ambulatory Visit: Payer: 59 | Admitting: Nurse Practitioner

## 2021-11-16 ENCOUNTER — Other Ambulatory Visit: Payer: Self-pay | Admitting: Cardiology

## 2021-11-16 ENCOUNTER — Other Ambulatory Visit: Payer: Self-pay | Admitting: Nurse Practitioner

## 2021-11-16 DIAGNOSIS — I25119 Atherosclerotic heart disease of native coronary artery with unspecified angina pectoris: Secondary | ICD-10-CM

## 2021-11-16 DIAGNOSIS — E785 Hyperlipidemia, unspecified: Secondary | ICD-10-CM

## 2021-11-18 ENCOUNTER — Other Ambulatory Visit: Payer: Self-pay | Admitting: Thoracic Surgery (Cardiothoracic Vascular Surgery)

## 2021-11-18 DIAGNOSIS — Z951 Presence of aortocoronary bypass graft: Secondary | ICD-10-CM

## 2021-11-23 NOTE — Progress Notes (Unsigned)
[address]       [place] [address]             [phone]       HPI: This is a 66 year old male with a past medical history of coronary artery disease (s/p PCI LAD), bilateral carotid artery disease, stroke, hyperlipidemia, diabetes mellitus, arthritis, and remote tobacco abuse who had a cardiac catheterization in June that showed progressive worsening of coronary artery disease. He underwent a CABG x 3 (LIMA to LAD, left radial  artery to Ramus Intermediate, and RSVG to PDA) by Dr. Kipp Brood on 10/12/2021. Patient returns for routine postoperative follow-up having undergone  Since hospital discharge the patient reports   Current Outpatient Medications  Medication Sig Dispense Refill   acetaminophen (TYLENOL) 500 MG tablet Take 1,000 mg by mouth every 8 (eight) hours as needed for mild pain.     aspirin EC 81 MG tablet Take 1 tablet (81 mg total) by mouth daily. Swallow whole. 90 tablet 3   atorvastatin (LIPITOR) 80 MG tablet Take 1 tablet by mouth once daily 90 tablet 0   blood glucose meter kit and supplies Dispense based on patient and insurance preference. Use up to four times daily as directed. (FOR ICD-10 E10.9, E11.9). 1 each 0   clopidogrel (PLAVIX) 75 MG tablet Take 1 tablet (75 mg total) by mouth daily. 30 tablet 1   diclofenac (CATAFLAM) 50 MG tablet TAKE 1 TABLET BY MOUTH THREE TIMES DAILY 90 tablet 0   Dulaglutide (TRULICITY) 1.59 EL/0.7AJ SOPN Inject 0.75 mg into the skin once a week. (Patient taking differently: Inject 0.75 mg into the skin every Friday.) 2 mL 1   ezetimibe (ZETIA) 10 MG tablet Take 1 tablet (10 mg total) by mouth every evening. 90 tablet 1   fenofibrate 160 MG tablet Take 1 tablet (160 mg total) by mouth daily. 30 tablet 6   glyBURIDE (DIABETA) 5 MG tablet TAKE 2 TABLETS BY MOUTH TWICE DAILY WITH A MEAL 180 tablet 2   metFORMIN (GLUCOPHAGE) 500 MG tablet TAKE 2 TABLETS BY MOUTH IN THE MORNING AND 2 IN THE EVENING 360 tablet 0    metoprolol tartrate (LOPRESSOR) 25 MG tablet Take 1 tablet by mouth twice daily 180 tablet 0   oxyCODONE (OXY IR/ROXICODONE) 5 MG immediate release tablet Take 1 tablet (5 mg total) by mouth every 6 (six) hours as needed for severe pain. 30 tablet 0   pioglitazone (ACTOS) 45 MG tablet Take 1 tablet (45 mg total) by mouth daily. 30 tablet 3  Vital Signs:   Physical Exam: CV- Pulmonary- Abdomen- Extremities- Wounds-  Diagnostic Tests:   Impression and Plan: He was seen by Wendelyn Breslow NP for cardiology on 11/01/2021. No changes were made to his medications at that time. Patient was instructed to continue with sternal precautions (I.e. no lifting more than 10 pounds) for the next 2 weeks.  As long as the patient is NOT taking any narcotic for pain, he/she may begin driving short distances (I.e. 30 minutes  or less during the day) and may gradually increase the frequency and duration as tolerates. We encourage the  Patient to participate in cardiac rehab;he/she does/does not wish to do so.     Nani Skillern, PA-C [facility] [phone]

## 2021-11-24 ENCOUNTER — Other Ambulatory Visit: Payer: Self-pay | Admitting: Nurse Practitioner

## 2021-11-24 DIAGNOSIS — E119 Type 2 diabetes mellitus without complications: Secondary | ICD-10-CM

## 2021-11-25 ENCOUNTER — Ambulatory Visit
Admission: RE | Admit: 2021-11-25 | Discharge: 2021-11-25 | Disposition: A | Discharge status: Home / Self Care | Payer: No Typology Code available for payment source | Source: Ambulatory Visit | Attending: Thoracic Surgery (Cardiothoracic Vascular Surgery) | Admitting: Thoracic Surgery (Cardiothoracic Vascular Surgery)

## 2021-11-25 ENCOUNTER — Ambulatory Visit (INDEPENDENT_AMBULATORY_CARE_PROVIDER_SITE_OTHER): Payer: Self-pay | Admitting: Physician Assistant

## 2021-11-25 VITALS — BP 125/70 | HR 71 | Resp 20 | Ht 67.0 in | Wt 179.0 lb

## 2021-11-25 DIAGNOSIS — Z951 Presence of aortocoronary bypass graft: Secondary | ICD-10-CM

## 2021-11-25 DIAGNOSIS — I2510 Atherosclerotic heart disease of native coronary artery without angina pectoris: Secondary | ICD-10-CM

## 2021-11-25 NOTE — Progress Notes (Addendum)
[address]       [place] [address]             [id number]       HPI:  Mr. Utsey is a 66 year old male with a past medical history of coronary artery disease (s/p PCI LAD), bilateral carotid artery disease, stroke, hyperlipidemia, diabetes mellitus, arthritis, and remote tobacco abuse who had a cardiac catheterization in June that showed progressive worsening of coronary artery disease. He underwent a CABG x 3 (LIMA to LAD, left radial artery to Ramus Intermediate, and RSVG to PDA) by Dr. Kipp Brood on 10/12/2021. He was found to have a thrombus in his native LAD and was started on Plavix. Patient returns for routine postoperative follow-up. Since hospital discharge the patient reports he feels well. His pain is well controlled and he has experienced no chest pain or shortness of breath. He states he has begun to use his riding lawn mower without soreness or pain. His only complaint is some swelling in his RLE. His CXR today shows no pleural effusion or pneumothorax.   Current Outpatient Medications  Medication Sig Dispense Refill   acetaminophen (TYLENOL) 500 MG tablet Take 1,000 mg by mouth every 8 (eight) hours as needed for mild pain.     aspirin EC 81 MG tablet Take 1 tablet (81 mg total) by mouth daily. Swallow whole. 90 tablet 3   atorvastatin (LIPITOR) 80 MG tablet Take 1 tablet by mouth once daily 90 tablet 0   blood glucose meter kit and supplies Dispense based on patient and insurance preference. Use up to four times daily as directed. (FOR ICD-10 E10.9, E11.9). 1 each 0   clopidogrel (PLAVIX) 75 MG tablet Take 1 tablet (75 mg total) by mouth daily. 30 tablet 1   diclofenac (CATAFLAM) 50 MG tablet TAKE 1 TABLET BY MOUTH THREE TIMES DAILY 90 tablet 0   Dulaglutide (TRULICITY) 7.37 TG/6.2IR SOPN Inject 0.75 mg into the skin once a week. (Patient taking differently: Inject 0.75 mg into the skin every Friday.) 2 mL 1   ezetimibe (ZETIA) 10 MG tablet Take 1 tablet  (10 mg total) by mouth every evening. 90 tablet 1   fenofibrate 160 MG tablet Take 1 tablet (160 mg total) by mouth daily. 30 tablet 6   glyBURIDE (DIABETA) 5 MG tablet TAKE 2 TABLETS BY MOUTH TWICE DAILY WITH A MEAL 180 tablet 2   metFORMIN (GLUCOPHAGE) 500 MG tablet TAKE 2 TABLETS BY MOUTH IN THE MORNING AND 2 IN THE EVENING 360 tablet 0   metoprolol tartrate (LOPRESSOR) 25 MG tablet Take 1 tablet by mouth twice daily 180 tablet 0   oxyCODONE (OXY IR/ROXICODONE) 5 MG immediate release tablet Take 1 tablet (5 mg total) by mouth every 6 (six) hours as needed for severe pain. 30 tablet 0   pioglitazone (ACTOS) 45 MG tablet Take 1 tablet (45 mg total) by mouth daily. 30 tablet 3   Vital Signs: Vitals:   11/25/21 1226  BP: 125/70  Pulse: 71  Resp: 20  SpO2: 96%    Physical Exam:  CV-NSR, no murmurs Pulmonary-Clear to auscultation bilaterally Extremities-RLE edema, neurovascularly intact Wounds-Clean, dry, intact  Diagnostic Tests: CLINICAL DATA:  Status post CABG x3 10/12/2021.   EXAM: CHEST - 2 VIEW   COMPARISON:  Chest two views 10/16/2021   FINDINGS: Status post median sternotomy and CABG. Cardiac silhouette and mediastinal contours are within normal limits. Resolution of the prior left basilar atelectasis. The  lungs are clear. No pleural effusion or pneumothorax. Mild multilevel degenerative disc changes of the thoracic spine.   IMPRESSION: No acute cardiopulmonary disease process.     Electronically Signed   By: Yvonne Kendall M.D.   On: 11/25/2021 12:27  Impression and Plan:  Mr. Thivierge is about 6 weeks weeks post op CABG x 3. He feels well and has no complaints other than an edematous RLE with no erythema or pain. I counseled him to keep the leg raised and use compression stockings. He was seen by Wendelyn Breslow NP for cardiology on 11/01/2021. No changes were made to his medications at that time. Patient was instructed to continue with sternal precautions (I.e. no  lifting more than 10 pounds) for the next 2 weeks. As long as the patient is NOT taking any narcotic for pain, he may begin driving short distances (I.e. 30 minutes or less during the day) and may gradually increase the frequency and duration as tolerates. I encouraged the patient to participate in cardiac rehab but he does not wish to do so as he feels he is progressing well. His CXR today showed no pleural effusion or acute cardiopulmonary process so we will plan to see him in the office as needed.   Magdalene River, PA-C [facility] [phone]

## 2021-11-25 NOTE — Patient Instructions (Addendum)
You are encouraged to enroll and participate in the outpatient cardiac rehab program beginning as soon as practical.  You may return to driving an automobile as long as you are no longer requiring oral narcotic pain relievers during the daytime.  It would be wise to start driving only short distances during the daylight and gradually increase from there as you feel comfortable.  Continue to avoid any heavy lifting or strenuous use of your arms or shoulders for at least a total of 3 months from the time of surgery. After 3 months, you may gradually increase how much you lift or use your chest or arms as tolerated. With limits based upon whether or not activities lead to the return of significant discomfort.

## 2021-12-01 ENCOUNTER — Ambulatory Visit: Payer: Self-pay | Admitting: Nurse Practitioner

## 2021-12-03 ENCOUNTER — Other Ambulatory Visit: Payer: Self-pay | Admitting: Nurse Practitioner

## 2021-12-03 DIAGNOSIS — E119 Type 2 diabetes mellitus without complications: Secondary | ICD-10-CM

## 2021-12-28 ENCOUNTER — Other Ambulatory Visit: Payer: Self-pay | Admitting: Nurse Practitioner

## 2022-01-06 ENCOUNTER — Other Ambulatory Visit: Payer: Self-pay | Admitting: Specialist

## 2022-01-24 ENCOUNTER — Other Ambulatory Visit: Payer: Self-pay | Admitting: Nurse Practitioner

## 2022-01-24 DIAGNOSIS — E119 Type 2 diabetes mellitus without complications: Secondary | ICD-10-CM

## 2022-01-25 ENCOUNTER — Other Ambulatory Visit: Payer: Self-pay | Admitting: Nurse Practitioner

## 2022-01-30 NOTE — Progress Notes (Unsigned)
Established patient visit   Patient: Mario Arnold   DOB: 02-19-1956   66 y.o. Male  MRN: [id number] Visit Date: 01/31/2022   No chief complaint on file.  Subjective    HPI  Routine follow up -type 2 diabetes  -most recent HgbA1c 08/10/2021 - 6.2 -today's HgbA1c -  -recently had CABG and seeing cardiology on routine basis.  -?diabetic eye exam -?foot exam   Medications: Outpatient Medications Prior to Visit  Medication Sig   acetaminophen (TYLENOL) 500 MG tablet Take 1,000 mg by mouth every 8 (eight) hours as needed for mild pain.   aspirin EC 81 MG tablet Take 1 tablet (81 mg total) by mouth daily. Swallow whole.   atorvastatin (LIPITOR) 80 MG tablet Take 1 tablet by mouth once daily   blood glucose meter kit and supplies Dispense based on patient and insurance preference. Use up to four times daily as directed. (FOR ICD-10 E10.9, E11.9).   clopidogrel (PLAVIX) 75 MG tablet Take 1 tablet (75 mg total) by mouth daily.   diclofenac (CATAFLAM) 50 MG tablet TAKE 1 TABLET BY MOUTH THREE TIMES DAILY   ezetimibe (ZETIA) 10 MG tablet Take 1 tablet (10 mg total) by mouth every evening.   fenofibrate 160 MG tablet Take 1 tablet (160 mg total) by mouth daily.   glyBURIDE (DIABETA) 5 MG tablet TAKE 2 TABLETS BY MOUTH TWICE DAILY WITH A MEAL   metFORMIN (GLUCOPHAGE) 500 MG tablet TAKE 2 TABLETS BY MOUTH IN THE MORNING AND 2 IN THE EVENING   metoprolol tartrate (LOPRESSOR) 25 MG tablet Take 1 tablet by mouth twice daily   oxyCODONE (OXY IR/ROXICODONE) 5 MG immediate release tablet Take 1 tablet (5 mg total) by mouth every 6 (six) hours as needed for severe pain.   pioglitazone (ACTOS) 45 MG tablet TAKE 1 TABLET BY MOUTH ONCE DAILY (DOSE  INCREASE)   TRULICITY 3.89 HT/3.4KA SOPN INJECT 0.75 MG SUBCUTANEOUSLY ONCE A WEEK   No facility-administered medications prior to visit.    Review of Systems  {Labs (Optional):23779}   Objective    There were no vitals filed for this visit. There is  no height or weight on file to calculate BMI.  BP Readings from Last 3 Encounters:  11/25/21 125/70  11/01/21 106/68  10/17/21 (Abnormal) 147/88    Wt Readings from Last 3 Encounters:  11/25/21 179 lb (81.2 kg)  11/01/21 177 lb (80.3 kg)  10/17/21 159 lb 13.3 oz (72.5 kg)    Physical Exam  ***  No results found for any visits on 01/31/22.  Assessment & Plan     Problem List Items Addressed This Visit   None    No follow-ups on file.         Ronnell Freshwater, NP  [facility] [phone] (phone) [phone] (fax)  [place]

## 2022-01-31 ENCOUNTER — Ambulatory Visit: Payer: Commercial Managed Care - HMO | Admitting: Nurse Practitioner

## 2022-01-31 ENCOUNTER — Encounter: Payer: Self-pay | Admitting: Nurse Practitioner

## 2022-01-31 VITALS — BP 137/72 | HR 61 | Ht 67.0 in | Wt 184.4 lb

## 2022-01-31 DIAGNOSIS — I25119 Atherosclerotic heart disease of native coronary artery with unspecified angina pectoris: Secondary | ICD-10-CM | POA: Diagnosis not present

## 2022-01-31 DIAGNOSIS — M5116 Intervertebral disc disorders with radiculopathy, lumbar region: Secondary | ICD-10-CM | POA: Diagnosis not present

## 2022-01-31 DIAGNOSIS — I10 Essential (primary) hypertension: Secondary | ICD-10-CM

## 2022-01-31 DIAGNOSIS — E119 Type 2 diabetes mellitus without complications: Secondary | ICD-10-CM | POA: Diagnosis not present

## 2022-01-31 LAB — POCT GLYCOSYLATED HEMOGLOBIN (HGB A1C): HbA1c POC (<> result, manual entry): 6.1 % (ref 4.0–5.6)

## 2022-01-31 MED ORDER — TRULICITY 0.75 MG/0.5ML ~~LOC~~ SOAJ: 0.7500 mg | SUBCUTANEOUS | 3 refills | Status: DC

## 2022-02-01 ENCOUNTER — Other Ambulatory Visit: Payer: Self-pay | Admitting: Specialist

## 2022-02-06 ENCOUNTER — Other Ambulatory Visit: Payer: Self-pay | Admitting: Nurse Practitioner

## 2022-02-06 DIAGNOSIS — I25119 Atherosclerotic heart disease of native coronary artery with unspecified angina pectoris: Secondary | ICD-10-CM

## 2022-03-08 NOTE — Progress Notes (Signed)
Mario Arnold Date of Birth: 05-Apr-1955 Medical Record #[id number]  History of Present Illness: Mario Arnold is seen for follow up CAD.  Has known CAD with  remote PCI of the LAD in 2003. Last cath in 2013 following abnormal Myoview showed moderate 2 vessel disease with a 50% in stent stenosis of the proximal LAD and a 70% lesion in an intermediate branch. EF of 50% - managed medically. Other issues include  thyroid nodule with normal TSH, prior TIA in 2013, DM, HTN and HLD.   He was seen in June 2018 with symptoms of increased dyspnea on exertion. Myoview was repeated and felt to be low risk. Last seen in Feb 2019. Carotid dopplers in June 2020 showed 40-59% right ICA stenosis. Followed by Dr Trula Slade. States he had Covid 19 back in October.   He was seen in June for pre op assessment for extensive lumbar surgery. Had some anginal symptoms and Myoview was abnormal. Underwent cardiac cath showing 3 vessel obstructive CAD. EF 45-50%. He underwent CABG by Dr Kipp Brood on 10/12/21 with LIMA to the LAD, radial graft to IM (Y graft off SVG) and SVG to PDA. Post op course was unremarkable. Carotid and LE arterial dopplers were OK.   He is doing very well in follow up today. No chest pain or dyspnea. Feels some numbness in left sternal area and left wrist from surgery. Able to pull trash cans to top of the hill. DM has improved significantly with Trulicity. Last A1c 6.1%. He has gained 11 lbs. Planning to have complete labs done next month with PCP. Will eventually need back surgery   Current Outpatient Medications  Medication Sig Dispense Refill   acetaminophen (TYLENOL) 500 MG tablet Take 1,000 mg by mouth as needed for mild pain.      aspirin EC 81 MG tablet Take 1 tablet (81 mg total) by mouth daily. Swallow whole. 90 tablet 3   atorvastatin (LIPITOR) 80 MG tablet Take 1 tablet (80 mg total) by mouth daily. 90 tablet 1   blood glucose meter kit and supplies Dispense based on patient and insurance preference.  Use up to four times daily as directed. (FOR ICD-10 E10.9, E11.9). 1 each 0   diclofenac (CATAFLAM) 50 MG tablet Take 1 tablet (50 mg total) by mouth 3 (three) times daily. 90 tablet 2   ezetimibe (ZETIA) 10 MG tablet Take 1 tablet (10 mg total) by mouth every evening. 90 tablet 1   fenofibrate 160 MG tablet Take 1 tablet (160 mg total) by mouth daily. 90 tablet 1   glyBURIDE (DIABETA) 5 MG tablet TAKE 2 TABLETS BY MOUTH TWICE DAILY WITH A MEAL 180 tablet 2   HYDROcodone-acetaminophen (NORCO/VICODIN) 5-325 MG tablet Take 1 tablet by mouth every 6 (six) hours as needed for moderate pain. 30 tablet 0   isosorbide mononitrate (IMDUR) 60 MG 24 hr tablet Take 1 tablet by mouth once daily 90 tablet 0   losartan (COZAAR) 25 MG tablet Take 1 tablet by mouth once daily 90 tablet 0   metFORMIN (GLUCOPHAGE) 500 MG tablet TAKE 2 TABLETS BY MOUTH IN THE MORNING AND 2 IN THE EVENING 360 tablet 0   metoprolol tartrate (LOPRESSOR) 25 MG tablet Take 1 tablet (25 mg total) by mouth 2 (two) times daily. 180 tablet 1   nitroGLYCERIN (NITROSTAT) 0.4 MG SL tablet Place 1 tablet (0.4 mg total) under the tongue every 5 (five) minutes as needed. 25 tablet 6   omega-3 acid ethyl esters (LOVAZA) 1 g  capsule Take 2 capsules (2 g total) by mouth 2 (two) times daily. 120 capsule 3   pioglitazone (ACTOS) 45 MG tablet Take 1 tablet (45 mg total) by mouth daily. 30 tablet 3   No current facility-administered medications for this visit.    Allergies  Allergen Reactions   Hydromorphone Nausea And Vomiting    Past Medical History:  Diagnosis Date   Arthritis    Carotid disease, bilateral ([place]) June 2013   less than 50% stenosis in the right and left    Chronic kidney disease 2017   stones   Coronary artery disease    STATUS POST ANGIOPLASTY OF THE LAD    Diabetes mellitus    TYPE 2   Dyslipidemia    History of transient ischemic attack (TIA)    on aggrenox   Hyperlipidemia    Myocardial infarction [facility]) 1999    stent placement- per patient has 2 stents   Stroke [facility])    Thyroid nodule June 2013   had [place] with 4 mm small cystic nodule noted. Needs follow up US in 12 months. TSH is normal.     Past Surgical History:  Procedure Laterality Date   COLONOSCOPY     13 years ago in [facility] normal exam per pt   CORONARY ANGIOPLASTY WITH STENT PLACEMENT  03/28/2001   LAD. EJECTION FRACTION IS 45%   SHOULDER ARTHROSCOPY WITH OPEN ROTATOR CUFF REPAIR AND DISTAL CLAVICLE ACROMINECTOMY Right 09/05/2012   Procedure: RIGHT SHOULDER ARTHROSCOPY WITH LABRAL DEBRIDEMENT AND OPEN DISTAL CLAVICLE RESECTION, acromionectomy  AND OPEN ROTATOR CUFF REPAIR;  Surgeon: Magnus Sinning, MD;  Location: [facility];  Service: Orthopedics;  Laterality: Right;    Social History   Tobacco Use  Smoking Status Former   Packs/day: 1.50   Years: 23.00   Total pack years: 34.50   Types: Cigarettes   Quit date: 03/28/1997   Years since quitting: 24.4  Smokeless Tobacco Former   Types: Snuff, Chew  Tobacco Comments   as a teen    Social History   Substance and Sexual Activity  Alcohol Use Yes   Alcohol/week: 2.0 standard drinks of alcohol   Types: 2 Cans of beer per week   Comment: socially    Family History  Problem Relation Age of Onset   Coronary artery disease Mother    Heart attack Mother    Diabetes Mother    Transient ischemic attack Mother    Heart disease Mother    Cancer Father    Colon cancer Neg Hx    Esophageal cancer Neg Hx    Colon polyps Neg Hx    Rectal cancer Neg Hx    Stomach cancer Neg Hx     Review of Systems: The review of systems is per the HPI.  All other systems were reviewed and are negative.  Physical Exam: BP 100/70 (BP Location: Left Arm, Patient Position: Sitting, Cuff Size: Normal)   Pulse (!) 52   Ht _0  (1.702 m)   Wt 182 lb (82.6 kg)   SpO2 96%   BMI 28.51 kg/m  GENERAL:  Well appearing WM in NAD HEENT:  PERRL, EOMI, sclera are clear. Oropharynx is  clear. NECK:  No jugular venous distention, carotid upstroke brisk and symmetric, no bruits, no thyromegaly or adenopathy LUNGS:  Clear to auscultation bilaterally CHEST:  Unremarkable HEART:  RRR,  PMI not displaced or sustained,S1 and S2 within normal limits, no S3, no S4: no clicks, no rubs, no murmurs ABD:  Soft, nontender. BS +, no masses or bruits. No hepatomegaly, no splenomegaly EXT:  2 + pulses throughout, no edema, no cyanosis no clubbing SKIN:  Warm and dry.  No rashes NEURO:  Alert and oriented x 3. Cranial nerves II through XII intact. PSYCH:  Cognitively intact    LABORATORY DATA:    Lab Results  Component Value Date   WBC 4.8 09/29/2020   HGB 16.3 09/29/2020   HCT 48.9 09/29/2020   PLT 177 09/29/2020   GLUCOSE 273 (H) 09/29/2020   CHOL 282 (H) 09/29/2020   TRIG 743 (HH) 09/29/2020   HDL 28 (L) 09/29/2020   LDLDIRECT 55.5 05/07/2013   LDLCALC 118 (H) 09/29/2020   ALT 40 09/29/2020   AST 35 09/29/2020   NA 133 (L) 09/29/2020   K 4.4 09/29/2020   CL 101 09/29/2020   CREATININE 1.01 09/29/2020   BUN 16 09/29/2020   CO2 20 09/29/2020   TSH 2.520 09/29/2020   INR 0.9 12/04/2019   HGBA1C 9.5 (H) 08/10/2021   MICROALBUR 30 08/10/2021   Carotid dopplers June 2020: 40-59% RICA stenosis.   Labs reviewed from primary care on 12/04/15: cholesterol 139, triglycerides 628, LDL 40, HDL 24. A1c 12. Glucose 250. BUN 19, creatinine 1.23. Other chemistries and CBC normal.  Dated 09/23/16: glucose 237, creatinine 1.3. A1c 10.4. Cholesterol 110, triglycerides 124. HDL 27, LDL 59.  Dated 04/21/17: A1c 7.5. Creatinine 1.39. Other chemistries normal. Dated 05/29/19: cholesterol 133, triglycerides 190, HDL 29, LDL 72. A1c 7.7%. creatinine 1.31. glucose 500. Bicarb 18. Other chemistries normal. CBC and TSH normal.  Dated 08/29/19: A1c 9%.  Dated 01/31/22: A1c 6.1%.   Ecg not done today    Myoview 09/30/16: Study Highlights   Clincally negative, electrically positive for ischemia  Excellent exercise tolerance Basal inferior, inferolateral defect that normalizes in recovery period. May reflect soft tissue attenuation, cannot exclude very mild ischemia LVEF 50% Low to intermediate risk study    Myoview 09/10/21: Study Highlights      Findings are consistent with prior myocardial infarction with peri-infarct ischemia. The study is intermediate risk.   No ST deviation was noted.   LV perfusion is abnormal. There is evidence of ischemia. Defect 1: There is a medium defect with moderate reduction in uptake present in the apical to mid anterior, anteroseptal and inferoseptal location(s) that is partially reversible. There is abnormal wall motion in the defect area. Consistent with peri-infarct ischemia.   Left ventricular function is abnormal. Global function is mildly reduced. Nuclear stress EF: 50 %. The left ventricular ejection fraction is mildly decreased (45-54%). End diastolic cavity size is mildly enlarged. End systolic cavity size is normal.   Prior study available for comparison from 09/30/2016. There are changes compared to prior study.   Mild apical defect at rest, with mild hypokinesis at the apex, consistent with prior infarct. At stress, mild-moderate defects in mid to apical anterior, anteroseptal, and inferoseptal walls consistent with ischemia.  Cardiac cath 09/24/21:  INTRAVASCULAR PRESSURE WIRE/FFR STUDY  LEFT HEART CATH AND CORONARY ANGIOGRAPHY   Conclusion      Prox RCA to Mid RCA lesion is 45% stenosed.   Dist RCA lesion is 85% stenosed.   RPDA lesion is 70% stenosed.   Dist Cx lesion is 60% stenosed.   Ramus lesion is 70% stenosed.   Prox LAD to Mid LAD lesion is 70% stenosed.   Mid LAD lesion is 60% stenosed.   LV end diastolic pressure is normal.   Severe 3 vessel  obstructive CAD. Long segmental disease in the proximal to mid LAD with abnormal RFR, 70% ramus intermediate, 85% distal RCA and 70% PDA. Normal LVEDP   Given diabetes and diffuse  disease would recommend referral for CABG. Coronary Diagrams  Diagnostic Dominance: Right  Inte   Echo 09/24/21: IMPRESSIONS     1. Apical, mid and basal inferior wall hypokinesis . Left ventricular  ejection fraction, by estimation, is 45 to 50%. The left ventricle has  mildly decreased function. The left ventricle demonstrates regional wall  motion abnormalities (see scoring  diagram/findings for description). Left ventricular diastolic parameters  were normal.   2. Right ventricular systolic function is normal. The right ventricular  size is normal.   3. Left atrial size was mildly dilated.   4. The mitral valve is normal in structure. No evidence of mitral valve  regurgitation. No evidence of mitral stenosis.   5. The aortic valve is tricuspid. There is mild calcification of the  aortic valve. There is mild thickening of the aortic valve. Aortic valve  regurgitation is not visualized. Aortic valve sclerosis is present, with  no evidence of aortic valve stenosis.   6. The inferior vena cava is normal in size with greater than 50%  respiratory variability, suggesting right atrial pressure of 3 mmHg.    Assessment / Plan: 1. CAD - remote PCI of the LAD in 2003 . Low risk Myoview in July 2018. Abnormal Myoview in June 2023 with symptoms. Repeat cath showed progressive 3 v CAD. Now s/p CABG on 10/12/21 x 3. Excellent recovery. No significant angina. Continue ASA, metoprolol, statin  2. HTN - BP is not at goal < 130/80. Was on losartan pre op. Will resume 25 mg daily.  3. HLD - on Lipitor, Zetia, fish oil, and fenofibrate. Plan repeat labs next month.  4. DM type 2. Last A1c 9.5%. much better on Trulicity with L3Y 1.0%.   5. Lumbar disc disease with radiculopathy. Anticipate surgery in future.     Follow up in 6 months.

## 2022-03-14 ENCOUNTER — Ambulatory Visit: Payer: Commercial Managed Care - HMO | Attending: Cardiology | Admitting: Cardiology

## 2022-03-14 ENCOUNTER — Encounter: Payer: Self-pay | Admitting: Cardiology

## 2022-03-14 VITALS — BP 146/88 | HR 71 | Ht 67.0 in | Wt 190.8 lb

## 2022-03-14 DIAGNOSIS — E119 Type 2 diabetes mellitus without complications: Secondary | ICD-10-CM

## 2022-03-14 DIAGNOSIS — I25118 Atherosclerotic heart disease of native coronary artery with other forms of angina pectoris: Secondary | ICD-10-CM | POA: Diagnosis not present

## 2022-03-14 DIAGNOSIS — I10 Essential (primary) hypertension: Secondary | ICD-10-CM | POA: Diagnosis not present

## 2022-03-14 DIAGNOSIS — E785 Hyperlipidemia, unspecified: Secondary | ICD-10-CM

## 2022-03-14 MED ORDER — LOSARTAN POTASSIUM 25 MG PO TABS: 25.0000 mg | ORAL_TABLET | Freq: Every day | ORAL | 3 refills | Status: DC

## 2022-03-14 NOTE — Patient Instructions (Signed)
Medication Instructions:  Start Losartan 25 mg daily Continue all other medications *If you need a refill on your cardiac medications before your next appointment, please call your pharmacy*   Lab Work: None ordered   Testing/Procedures: None ordered   Follow-Up: At [facility], you and your health needs are our priority.  As part of our continuing mission to provide you with exceptional heart care, we have created designated Provider Care Teams.  These Care Teams include your primary Cardiologist (physician) and Advanced Practice Providers (APPs -  Physician Assistants and Nurse Practitioners) who all work together to provide you with the care you need, when you need it.  We recommend signing up for the patient portal called "MyChart".  Sign up information is provided on this After Visit Summary.  MyChart is used to connect with patients for Virtual Visits (Telemedicine).  Patients are able to view lab/test results, encounter notes, upcoming appointments, etc.  Non-urgent messages can be sent to your provider as well.   To learn more about what you can do with MyChart, go to [email or web address].    Your next appointment:  6 months   Call in March to schedule June appointment     The format for your next appointment: Office   Provider:  Dr.Jordan   Important Information About Sugar

## 2022-03-18 ENCOUNTER — Other Ambulatory Visit: Payer: Self-pay | Admitting: Nurse Practitioner

## 2022-03-18 DIAGNOSIS — E119 Type 2 diabetes mellitus without complications: Secondary | ICD-10-CM

## 2022-03-31 ENCOUNTER — Other Ambulatory Visit: Payer: Self-pay | Admitting: Nurse Practitioner

## 2022-05-02 NOTE — Progress Notes (Signed)
Complete physical exam   Patient: Mario Arnold   DOB: [birth year]   67 y.o. Male  MRN: [id number] Visit Date: 05/03/2022    Chief Complaint  Patient presents with   Annual Exam   Subjective    Mario Arnold is a 67 y.o. male who presents today for a complete physical exam.  He reports consuming a low sodium diet.  He is physically active everyday  He generally feels well. He does not have additional problems to discuss today.   HPI  Annual physical -due for routine, fasting labs today -diabetic eye exam  -hypertension and CAD.  --had triple bypass surgery back in July 2023.  --sees cardiology  denies chest pain, chest pressure, or shortness of breath. He denies headaches or visual disturbances. He denies abdominal pain, nausea, vomiting, or changes in bowel or bladder habits.    Past Medical History:  Diagnosis Date   Arthritis    Carotid disease, bilateral ([place]) 08/2011   less than 50% stenosis in the right and left    Chronic kidney disease 2017   stones   Coronary artery disease    STATUS POST ANGIOPLASTY OF THE LAD    Diabetes mellitus    TYPE 2   Dyslipidemia    History of kidney stones    History of transient ischemic attack (TIA)    on aggrenox   Hyperlipidemia    Myocardial infarction [facility]) 1999   stent placement- per patient has 2 stents   Stroke [facility])    Thyroid nodule 08/2011   had [place] with 4 mm small cystic nodule noted. Needs follow up US in 12 months. TSH is normal.    Past Surgical History:  Procedure Laterality Date   COLONOSCOPY     13 years ago in [facility] normal exam per pt   CORONARY ANGIOPLASTY WITH STENT PLACEMENT  03/28/2001   LAD. EJECTION FRACTION IS 45%   CORONARY ARTERY BYPASS GRAFT N/A 10/12/2021   Procedure: CORONARY ARTERY BYPASS GRAFTING (CABG) x3 USING LEFT INTERNAL MAMMARY ARTERY AND LEFT RADIAL ARM GRAFT;  Surgeon: Lajuana Matte, MD;  Location: [place];  Service: Open Heart Surgery;  Laterality: N/A;   INTRAVASCULAR  PRESSURE WIRE/FFR STUDY N/A 09/24/2021   Procedure: INTRAVASCULAR PRESSURE WIRE/FFR STUDY;  Surgeon: Martinique, Peter M, MD;  Location: [place] CV LAB;  Service: Cardiovascular;  Laterality: N/A;   LEFT HEART CATH AND CORONARY ANGIOGRAPHY N/A 09/24/2021   Procedure: LEFT HEART CATH AND CORONARY ANGIOGRAPHY;  Surgeon: Martinique, Peter M, MD;  Location: [place] CV LAB;  Service: Cardiovascular;  Laterality: N/A;   RADIAL ARTERY HARVEST Left 10/12/2021   Procedure: RADIAL ARTERY HARVEST;  Surgeon: Lajuana Matte, MD;  Location: [place];  Service: Open Heart Surgery;  Laterality: Left;   SHOULDER ARTHROSCOPY WITH OPEN ROTATOR CUFF REPAIR AND DISTAL CLAVICLE ACROMINECTOMY Right 09/05/2012   Procedure: RIGHT SHOULDER ARTHROSCOPY WITH LABRAL DEBRIDEMENT AND OPEN DISTAL CLAVICLE RESECTION, acromionectomy  AND OPEN ROTATOR CUFF REPAIR;  Surgeon: Magnus Sinning, MD;  [facility];  Service: Orthopedics;  Laterality: Right;   TEE WITHOUT CARDIOVERSION N/A 10/12/2021   Procedure: TRANSESOPHAGEAL ECHOCARDIOGRAM (TEE);  Surgeon: Lajuana Matte, MD;  Location: [place];  Service: Open Heart Surgery;  Laterality: N/A;   Social History   Socioeconomic History   Marital status: Married    Spouse name: Oley Dallis   Number of children: 2   Years of education: Not on file   Highest education level: Not on file  Occupational History   Occupation: Repair semi trailers.  Tobacco Use   Smoking status: Former    Packs/day: 1.50    Years: 23.00    Total pack years: 34.50    Types: Cigarettes    Quit date: 03/28/1997    Years since quitting: 25.1   Smokeless tobacco: Former    Types: Snuff, Chew   Tobacco comments:    as a teen  Vaping Use   Vaping Use: Never used  Substance and Sexual Activity   Alcohol use: Yes    Alcohol/week: 2.0 standard drinks of alcohol    Types: 2 Cans of beer per week    Comment: socially   Drug use: No   Sexual activity: Not Currently  Other Topics Concern   Not  on file  Social History Narrative   Not on file   Social Determinants of Health   Financial Resource Strain: Not on file  Food Insecurity: Not on file  Transportation Needs: Not on file  Physical Activity: Not on file  Stress: Not on file  Social Connections: Not on file  Intimate Partner Violence: Not on file   Family Status  Relation Name Status   Mother  Deceased   Father  Deceased   Sister  Alive   Sister  Alive   Brother  Alive   Brother  Alive   Neg Hx  (Not Specified)   Family History  Problem Relation Age of Onset   Coronary artery disease Mother    Heart attack Mother    Diabetes Mother    Transient ischemic attack Mother    Heart disease Mother    Cancer Father    Colon cancer Neg Hx    Esophageal cancer Neg Hx    Colon polyps Neg Hx    Rectal cancer Neg Hx    Stomach cancer Neg Hx    Allergies  Allergen Reactions   Nsaids Other (See Comments)    Aleve* bad on Kidneys    Dilaudid [Hydromorphone] Nausea And Vomiting    Patient Care Team: Ronnell Freshwater, NP as PCP - General (Family Medicine) Martinique, Peter M, MD as PCP - Cardiology (Cardiology)   Medications: Outpatient Medications Prior to Visit  Medication Sig   acetaminophen (TYLENOL) 500 MG tablet Take 1,000 mg by mouth every 8 (eight) hours as needed for mild pain.   aspirin EC 81 MG tablet Take 1 tablet (81 mg total) by mouth daily. Swallow whole.   atorvastatin (LIPITOR) 80 MG tablet Take 1 tablet by mouth once daily   blood glucose meter kit and supplies Dispense based on patient and insurance preference. Use up to four times daily as directed. (FOR ICD-10 E10.9, E11.9).   diclofenac (CATAFLAM) 50 MG tablet TAKE 1 TABLET BY MOUTH THREE TIMES DAILY   ezetimibe (ZETIA) 10 MG tablet Take 1 tablet (10 mg total) by mouth every evening.   losartan (COZAAR) 25 MG tablet Take 1 tablet (25 mg total) by mouth daily.   metFORMIN (GLUCOPHAGE) 500 MG tablet TAKE 2 TABLETS BY MOUTH IN THE MORNING AND 2 IN  THE EVENING   [DISCONTINUED] Dulaglutide (TRULICITY) A999333 0000000 SOPN Inject 0.75 mg into the skin once a week.   [DISCONTINUED] fenofibrate 160 MG tablet Take 1 tablet (160 mg total) by mouth daily.   [DISCONTINUED] glyBURIDE (DIABETA) 5 MG tablet TAKE 2 TABLETS BY MOUTH TWICE DAILY WITH A MEAL   [DISCONTINUED] metoprolol tartrate (LOPRESSOR) 25 MG tablet Take 1 tablet by mouth twice daily  No facility-administered medications prior to visit.    Review of Systems See HPI      Objective     Today's Vitals   05/03/22 0819  BP: 124/79  Pulse: 70  SpO2: 96%  Weight: 189 lb 6.4 oz (85.9 kg)  Height: '5\' 7"'$  (1.702 m)   Body mass index is 29.66 kg/m.  BP Readings from Last 3 Encounters:  05/03/22 124/79  03/14/22 (Abnormal) 146/88  01/31/22 137/72    Wt Readings from Last 3 Encounters:  05/03/22 189 lb 6.4 oz (85.9 kg)  03/14/22 190 lb 12.8 oz (86.5 kg)  01/31/22 184 lb 6.4 oz (83.6 kg)     Physical Exam Vitals and nursing note reviewed.  Constitutional:      Appearance: Normal appearance. He is well-developed.  HENT:     Head: Normocephalic and atraumatic.     Right Ear: Tympanic membrane, ear canal and external ear normal.     Left Ear: Tympanic membrane, ear canal and external ear normal.     Nose: Nose normal.     Mouth/Throat:     Mouth: Mucous membranes are moist.     Pharynx: Oropharynx is clear.  Eyes:     Extraocular Movements: Extraocular movements intact.     Conjunctiva/sclera: Conjunctivae normal.     Pupils: Pupils are equal, round, and reactive to light.  Cardiovascular:     Rate and Rhythm: Normal rate and regular rhythm.     Pulses: Normal pulses.     Heart sounds: Normal heart sounds.  Pulmonary:     Effort: Pulmonary effort is normal.     Breath sounds: Normal breath sounds.  Abdominal:     General: Bowel sounds are normal. There is no distension.     Palpations: Abdomen is soft. There is no mass.     Tenderness: There is no abdominal  tenderness. There is no guarding or rebound.     Hernia: No hernia is present.  Musculoskeletal:        General: Normal range of motion.     Cervical back: Normal range of motion and neck supple.  Skin:    General: Skin is warm and dry.     Capillary Refill: Capillary refill takes less than 2 seconds.  Neurological:     General: No focal deficit present.     Mental Status: He is alert and oriented to person, place, and time.  Psychiatric:        Mood and Affect: Mood normal.        Behavior: Behavior normal.        Thought Content: Thought content normal.        Judgment: Judgment normal.     Last depression screening scores   Row Labels 05/03/2022    8:22 AM 01/31/2022    9:33 AM 08/10/2021    9:38 AM  PHQ 2/9 Scores   Section Header. No data exists in this row.     PHQ - 2 Score   0 0 0  PHQ- 9 Score   '2 1 2   '$ Last fall risk screening   Row Labels 05/03/2022    8:23 AM  [place]. No data exists in this row.   Falls in the past year?   0  Number falls in past yr:   0  Injury with Fall?   0  Follow up   Falls evaluation completed    Assessment & Plan    1.  Encounter for general adult medical examination with abnormal findings Annual physical today   2. Diabetes mellitus type 2 in nonobese (HCC) Check HgbA1c today with routine, fasting labs. Adjust medication as indicated.  - Hemoglobin A1c; Future - Hemoglobin A1c  3. Hypertension associated with diabetes ([place]) Stable. Continue bp medication as prescribed.  - Comprehensive metabolic panel; Future - CBC; Future - CBC - Comprehensive metabolic panel  4. Mixed diabetic hyperlipidemia associated with type 2 diabetes mellitus (HCC) Check fasting lipid panel. Adjust lipid lowering medication as indicated..  - Lipid panel; Future - Comprehensive metabolic panel; Future - CBC; Future - CBC - Comprehensive metabolic panel - Lipid panel  5. Coronary artery disease involving native coronary artery of  native heart with angina pectoris (HCC) Stable. Continue regular visits with cardiology  as scheduled.   6. Other fatigue Check labs for further evaluation.  - TSH + free T4; Future - Comprehensive metabolic panel; Future - CBC; Future - CBC - Comprehensive metabolic panel - TSH + free T4  7. Screening for prostate cancer Check PSA with routine labs.  - PSA; Future - PSA      Immunization History  Administered Date(s) Administered   Influenza Split 12/20/2013, 01/17/2015   Influenza,inj,Quad PF,6+ Mos 02/02/2017, 01/04/2018   Influenza,inj,quad, With Preservative 12/04/2015   Pneumococcal Polysaccharide-23 04/03/2015, 01/25/2019   Tdap 04/03/2015   Zoster Recombinat (Shingrix) 09/25/2019, 03/27/2020   Zoster, Live 05/29/2019, 08/29/2019    Health Maintenance  Topic Date Due   COVID-19 Vaccine (1) Never done   FOOT EXAM  Never done   OPHTHALMOLOGY EXAM  Never done   Pneumonia Vaccine 45+ Years old (2 of 2 - PCV) 05/29/2022   INFLUENZA VACCINE  06/26/2022 (Originally 10/26/2021)   Hepatitis C Screening  08/11/2022 (Originally 05/29/1973)   HIV Screening  08/11/2022 (Originally [birth year]28/1970)   Diabetic kidney evaluation - Urine ACR  08/11/2022   HEMOGLOBIN A1C  11/01/2022   Diabetic kidney evaluation - eGFR measurement  05/04/2023   Medicare Annual Wellness (AWV)  05/04/2023   DTaP/Tdap/Td (2 - Td or Tdap) 04/02/2025   COLONOSCOPY (Pts 45-38yr Insurance coverage will need to be confirmed)  12/04/2030   Zoster Vaccines- Shingrix  Completed   HPV VACCINES  Aged Out    Discussed health benefits of physical activity, and encouraged him to engage in regular exercise appropriate for his age and condition.  Problem List Items Addressed This Visit       Cardiovascular and Mediastinum   Coronary artery disease   Essential hypertension     Endocrine   Diabetes mellitus type 2 in nonobese (HCC)   Relevant Orders   Hemoglobin A1c (Completed)     Other   Dyslipidemia    Screening for prostate cancer   Relevant Orders   PSA (Completed)   Encounter for general adult medical examination with abnormal findings - Primary   Other fatigue   Relevant Orders   TSH + free T4 (Completed)   Comprehensive metabolic panel (Completed)   CBC (Completed)     Return in about 3 months (around 08/01/2022) for diabetes with HgbA1c check.        HRonnell Freshwater NP  [phone](phone) [phone](fax)  CEunice

## 2022-05-03 ENCOUNTER — Ambulatory Visit (INDEPENDENT_AMBULATORY_CARE_PROVIDER_SITE_OTHER): Payer: Commercial Managed Care - HMO | Admitting: Nurse Practitioner

## 2022-05-03 ENCOUNTER — Encounter: Payer: Self-pay | Admitting: Nurse Practitioner

## 2022-05-03 ENCOUNTER — Other Ambulatory Visit: Payer: Self-pay | Admitting: Nurse Practitioner

## 2022-05-03 VITALS — BP 124/79 | HR 70 | Ht 67.0 in | Wt 189.4 lb

## 2022-05-03 DIAGNOSIS — I25119 Atherosclerotic heart disease of native coronary artery with unspecified angina pectoris: Secondary | ICD-10-CM | POA: Diagnosis not present

## 2022-05-03 DIAGNOSIS — Z0001 Encounter for general adult medical examination with abnormal findings: Secondary | ICD-10-CM | POA: Diagnosis not present

## 2022-05-03 DIAGNOSIS — E1169 Type 2 diabetes mellitus with other specified complication: Secondary | ICD-10-CM | POA: Diagnosis not present

## 2022-05-03 DIAGNOSIS — E1159 Type 2 diabetes mellitus with other circulatory complications: Secondary | ICD-10-CM | POA: Diagnosis not present

## 2022-05-03 DIAGNOSIS — I10 Essential (primary) hypertension: Secondary | ICD-10-CM

## 2022-05-03 DIAGNOSIS — E119 Type 2 diabetes mellitus without complications: Secondary | ICD-10-CM

## 2022-05-03 DIAGNOSIS — I152 Hypertension secondary to endocrine disorders: Secondary | ICD-10-CM

## 2022-05-03 DIAGNOSIS — E782 Mixed hyperlipidemia: Secondary | ICD-10-CM

## 2022-05-03 DIAGNOSIS — Z125 Encounter for screening for malignant neoplasm of prostate: Secondary | ICD-10-CM

## 2022-05-03 DIAGNOSIS — E785 Hyperlipidemia, unspecified: Secondary | ICD-10-CM

## 2022-05-03 DIAGNOSIS — R5383 Other fatigue: Secondary | ICD-10-CM | POA: Diagnosis not present

## 2022-05-04 ENCOUNTER — Other Ambulatory Visit: Payer: Self-pay | Admitting: Nurse Practitioner

## 2022-05-04 ENCOUNTER — Other Ambulatory Visit: Payer: Self-pay

## 2022-05-04 DIAGNOSIS — E119 Type 2 diabetes mellitus without complications: Secondary | ICD-10-CM

## 2022-05-04 DIAGNOSIS — E785 Hyperlipidemia, unspecified: Secondary | ICD-10-CM

## 2022-05-04 LAB — TSH+FREE T4
Free T4: 1.22 ng/dL (ref 0.82–1.77)
TSH: 3.73 u[IU]/mL (ref 0.450–4.500)

## 2022-05-04 LAB — COMPREHENSIVE METABOLIC PANEL
ALT: 47 IU/L — ABNORMAL HIGH (ref 0–44)
AST: 35 IU/L (ref 0–40)
Albumin/Globulin Ratio: 1.8 (ref 1.2–2.2)
Albumin: 4.6 g/dL (ref 3.9–4.9)
Alkaline Phosphatase: 118 IU/L (ref 44–121)
BUN/Creatinine Ratio: 14 (ref 10–24)
BUN: 20 mg/dL (ref 8–27)
Bilirubin Total: 0.3 mg/dL (ref 0.0–1.2)
CO2: 20 mmol/L (ref 20–29)
Calcium: 9.6 mg/dL (ref 8.6–10.2)
Chloride: 100 mmol/L (ref 96–106)
Creatinine, Ser: 1.38 mg/dL — ABNORMAL HIGH (ref 0.76–1.27)
Globulin, Total: 2.5 g/dL (ref 1.5–4.5)
Glucose: 307 mg/dL — ABNORMAL HIGH (ref 70–99)
Potassium: 5.2 mmol/L (ref 3.5–5.2)
Sodium: 134 mmol/L (ref 134–144)
Total Protein: 7.1 g/dL (ref 6.0–8.5)
eGFR: 57 mL/min/{1.73_m2} — ABNORMAL LOW (ref >59)

## 2022-05-04 LAB — CBC
Hematocrit: 46.3 % (ref 37.5–51.0)
Hemoglobin: 15.3 g/dL (ref 13.0–17.7)
MCH: 28.1 pg (ref 26.6–33.0)
MCHC: 33 g/dL (ref 31.5–35.7)
MCV: 85 fL (ref 79–97)
Platelets: 168 10*3/uL (ref 150–450)
RBC: 5.44 x10E6/uL (ref 4.14–5.80)
RDW: 13.6 % (ref 11.6–15.4)
WBC: 4.8 10*3/uL (ref 3.4–10.8)

## 2022-05-04 LAB — LIPID PANEL
Chol/HDL Ratio: 6.8 ratio — ABNORMAL HIGH (ref 0.0–5.0)
Cholesterol, Total: 163 mg/dL (ref 100–199)
HDL: 24 mg/dL — ABNORMAL LOW (ref >39)
LDL Chol Calc (NIH): 58 mg/dL (ref 0–99)
Triglycerides: 538 mg/dL — ABNORMAL HIGH (ref 0–149)
VLDL Cholesterol Cal: 81 mg/dL — ABNORMAL HIGH (ref 5–40)

## 2022-05-04 LAB — HEMOGLOBIN A1C
Est. average glucose Bld gHb Est-mCnc: 243 mg/dL
Hgb A1c MFr Bld: 10.1 % — ABNORMAL HIGH (ref 4.8–5.6)

## 2022-05-04 LAB — PSA: Prostate Specific Ag, Serum: 0.7 ng/mL (ref 0.0–4.0)

## 2022-05-04 MED ORDER — TRULICITY 1.5 MG/0.5ML ~~LOC~~ SOAJ: 1.5000 mg | SUBCUTANEOUS | 2 refills | Status: DC

## 2022-05-04 MED ORDER — FENOFIBRATE 160 MG PO TABS: 160.0000 mg | ORAL_TABLET | Freq: Every day | ORAL | 2 refills | Status: DC

## 2022-05-04 NOTE — Progress Notes (Signed)
Hi Dr. Martinique.  Mr. Anguiano asked that I send his labs to you. I have increased his trulicity to 1.5 g weekly. Lipids are a bit elevated and renal functions are up.  Have a great day.  Leretha Pol, FNP-c

## 2022-05-04 NOTE — Progress Notes (Signed)
Please let the patient know that his blood sugars are very high. I have increased his trulicity to 1.5 mg weekly. I sent new prescription to [facility] drive. I will also send Dr. Martinique the lab results. Thanks so much.   -HB

## 2022-05-05 ENCOUNTER — Telehealth: Payer: Self-pay | Admitting: *Deleted

## 2022-05-05 NOTE — Telephone Encounter (Signed)
Spoke with patient aware per Dr Martinique needs to be scheduled  for visit with Dr Debara Pickett for lipid management

## 2022-05-05 NOTE — Telephone Encounter (Signed)
-----   Message from Mario M Martinique, MD sent at 05/05/2022  7:24 AM EST ----- Please make him an appointment with Dr Debara Pickett for lipid management  Mario Martinique MD, [facility]   ----- Message ----- From: Ronnell Freshwater, NP Sent: 05/04/2022  12:40 PM EST To: Mario M Martinique, MD  Hi Dr. Martinique.  Mr. Sadowski asked that I send his labs to you. I have increased his trulicity to 1.5 g weekly. Lipids are a bit elevated and renal functions are up.  Have a great day.  Leretha Pol, FNP-c

## 2022-05-15 ENCOUNTER — Other Ambulatory Visit: Payer: Self-pay | Admitting: Nurse Practitioner

## 2022-05-15 DIAGNOSIS — I25119 Atherosclerotic heart disease of native coronary artery with unspecified angina pectoris: Secondary | ICD-10-CM

## 2022-05-29 DIAGNOSIS — R5383 Other fatigue: Secondary | ICD-10-CM | POA: Insufficient documentation

## 2022-05-31 NOTE — Telephone Encounter (Signed)
Error

## 2022-06-09 ENCOUNTER — Telehealth: Payer: Self-pay | Admitting: Nurse Practitioner

## 2022-06-09 DIAGNOSIS — E119 Type 2 diabetes mellitus without complications: Secondary | ICD-10-CM

## 2022-06-10 ENCOUNTER — Other Ambulatory Visit: Payer: Self-pay | Admitting: Nurse Practitioner

## 2022-06-10 DIAGNOSIS — E119 Type 2 diabetes mellitus without complications: Secondary | ICD-10-CM

## 2022-06-10 MED ORDER — TRULICITY 3 MG/0.5ML ~~LOC~~ SOAJ: 3.0000 mg | SUBCUTANEOUS | 2 refills | Status: DC

## 2022-06-10 NOTE — Telephone Encounter (Signed)
I changed this to 3 mg weekly and sent new prescription to [place].

## 2022-06-15 NOTE — Telephone Encounter (Signed)
[facility] called stating that the pt was never on 1.5 and that he should jump from the lowest dose to 3mg    She stated that they have the option for Ozempic for a taper that's not on back order.   Please advise

## 2022-07-04 ENCOUNTER — Ambulatory Visit: Payer: Medicare Other | Attending: Internal Medicine | Admitting: Internal Medicine

## 2022-07-04 ENCOUNTER — Encounter: Payer: Self-pay | Admitting: Internal Medicine

## 2022-07-04 VITALS — BP 158/92 | HR 70 | Ht 67.0 in | Wt 188.2 lb

## 2022-07-04 DIAGNOSIS — E785 Hyperlipidemia, unspecified: Secondary | ICD-10-CM

## 2022-07-04 DIAGNOSIS — E781 Pure hyperglyceridemia: Secondary | ICD-10-CM | POA: Diagnosis not present

## 2022-07-04 DIAGNOSIS — E119 Type 2 diabetes mellitus without complications: Secondary | ICD-10-CM

## 2022-07-04 DIAGNOSIS — I25118 Atherosclerotic heart disease of native coronary artery with other forms of angina pectoris: Secondary | ICD-10-CM | POA: Diagnosis not present

## 2022-07-04 NOTE — Patient Instructions (Signed)
Medication Instructions:  NO CHANGES  *If you need a refill on your cardiac medications before your next appointment, please call your pharmacy*   Lab Work: FASTING lab work to check cholesterol in 6 months  If you have labs (blood work) drawn today and your tests are completely normal, you will receive your results only by: MyChart Message (if you have MyChart) OR A paper copy in the mail If you have any lab test that is abnormal or we need to change your treatment, we will call you to review the results.   Testing/Procedures: NONE   Follow-Up: At [facility], you and your health needs are our priority.  As part of our continuing mission to provide you with exceptional heart care, we have created designated Provider Care Teams.  These Care Teams include your primary Cardiologist (physician) and Advanced Practice Providers (APPs -  Physician Assistants and Nurse Practitioners) who all work together to provide you with the care you need, when you need it.  We recommend signing up for the patient portal called "MyChart".  Sign up information is provided on this After Visit Summary.  MyChart is used to connect with patients for Virtual Visits (Telemedicine).  Patients are able to view lab/test results, encounter notes, upcoming appointments, etc.  Non-urgent messages can be sent to your provider as well.   To learn more about what you can do with MyChart, go to [email or web address].    Your next appointment:    6 months with Dr. Hilty  

## 2022-07-04 NOTE — Progress Notes (Signed)
LIPID CLINIC CONSULT NOTE  Chief Complaint:  High triglycerides  Primary Care Physician: Carlean JewsBoscia, Heather E, NP  Primary Cardiologist:  Peter SwazilandJordan, MD  HPI:  Mario Arnold is a 67 y.o. male who is being seen today for the evaluation of high triglycerides at the request of Carlean JewsBoscia, Heather E, NP. This is a pleasant 67 year old male kindly referred for evaluation of high triglycerides.  This is a longstanding issue and I think a genetic triglyceride disorder.  He has coronary artery disease and has had prior bypass and angioplasty as well as bilateral carotid artery disease.  He also has type 2 diabetes which has been variably controlled.  Lipids 9 months ago actually were very well-controlled with total cholesterol 117, triglycerides 107, HDL 30 and LDL 67.  He is on a combination of fenofibrate, ezetimibe and atorvastatin.  Unfortunately, his lipids 2 months ago were significantly worse with total cholesterol 163, triglycerides 538, HDL 24 and LDL 58.  This also mirrors his hemoglobin A1c which was previously 6.2% and has climbed to 10.1%.  Some of the factors were that he was initially switched to Trulicity which she said worked well but he had difficulty affording the medication.  He was off of it for a while and recently was able to restart it.  On top of that, he said his diet has become significantly worse.  He has not been able to maintain what was much healthier and low carb diet back about a year ago.  He does have chronic back issues and occasionally needs a cane to help with walking which limits his exercise.  PMHx:  Past Medical History:  Diagnosis Date   Arthritis    Carotid disease, bilateral 08/2011   less than 50% stenosis in the right and left    Chronic kidney disease 2017   stones   Coronary artery disease    STATUS POST ANGIOPLASTY OF THE LAD    Diabetes mellitus    TYPE 2   Dyslipidemia    History of kidney stones    History of transient ischemic attack (TIA)     on aggrenox   Hyperlipidemia    Myocardial infarction 1999   stent placement- per patient has 2 stents   Stroke    Thyroid nodule 08/2011   had US with 4 mm small cystic nodule noted. Needs follow up US in 12 months. TSH is normal.     Past Surgical History:  Procedure Laterality Date   [facility]     [facility]   CORONARY ANGIOPLASTY WITH STENT PLACEMENT  03/28/2001   LAD. EJECTION FRACTION IS 45%   CORONARY ARTERY BYPASS GRAFT N/A 10/12/2021   Procedure: CORONARY ARTERY BYPASS GRAFTING (CABG) x3 USING LEFT INTERNAL MAMMARY ARTERY AND LEFT RADIAL ARM GRAFT;  Surgeon: Corliss SkainsLightfoot, Harrell O, MD;  [facility];  Service: Open Heart Surgery;  Laterality: N/A;   CORONARY PRESSURE/FFR STUDY N/A 09/24/2021   Procedure: INTRAVASCULAR PRESSURE WIRE/FFR STUDY;  Surgeon: SwazilandJordan, Peter M, MD;  [facility];  Service: Cardiovascular;  Laterality: N/A;   LEFT HEART CATH AND CORONARY ANGIOGRAPHY N/A 09/24/2021   Procedure: LEFT HEART CATH AND CORONARY ANGIOGRAPHY;  Surgeon: SwazilandJordan, Peter M, MD;  [facility];  Service: Cardiovascular;  Laterality: N/A;   RADIAL ARTERY HARVEST Left 10/12/2021   Procedure: RADIAL ARTERY HARVEST;  Surgeon: Corliss SkainsLightfoot, Harrell O, MD;  [facility];  Service: Open Heart Surgery;  Laterality: Left;   SHOULDER ARTHROSCOPY WITH OPEN ROTATOR CUFF REPAIR AND DISTAL CLAVICLE ACROMINECTOMY Right 09/05/2012   Procedure: RIGHT SHOULDER ARTHROSCOPY WITH LABRAL DEBRIDEMENT AND OPEN DISTAL CLAVICLE RESECTION, acromionectomy  AND OPEN ROTATOR CUFF REPAIR;  Surgeon: Drucilla Schmidt, MD;  [facility];  Service: Orthopedics;  Laterality: Right;   TEE WITHOUT CARDIOVERSION N/A 10/12/2021   Procedure: TRANSESOPHAGEAL ECHOCARDIOGRAM (TEE);  Surgeon: Corliss Skains, MD;  [facility];  Service: Open Heart Surgery;  Laterality: N/A;    FAMHx:  Family History  Problem Relation Age of Onset   Coronary artery  disease Mother    Heart attack Mother    Diabetes Mother    Transient ischemic attack Mother    Heart disease Mother    Cancer Father    Colon cancer Neg Hx    Esophageal cancer Neg Hx    Colon polyps Neg Hx    Rectal cancer Neg Hx    Stomach cancer Neg Hx     SOCHx:   reports that he quit smoking about 25 years ago. His smoking use included cigarettes. He has a 34.50 pack-year smoking history. He has quit using smokeless tobacco.  His smokeless tobacco use included snuff and chew. He reports current alcohol use of about 2.0 standard drinks of alcohol per week. He reports that he does not use drugs.  ALLERGIES:  Allergies  Allergen Reactions   Nsaids Other (See Comments)    Aleve* bad on Kidneys    Dilaudid [Hydromorphone] Nausea And Vomiting    ROS: Pertinent items noted in HPI and remainder of comprehensive ROS otherwise negative.  HOME MEDS: Current Outpatient Medications on File Prior to Visit  Medication Sig Dispense Refill   acetaminophen (TYLENOL) 500 MG tablet Take 1,000 mg by mouth every 8 (eight) hours as needed for mild pain.     aspirin EC 81 MG tablet Take 1 tablet (81 mg total) by mouth daily. Swallow whole. 90 tablet 3   atorvastatin (LIPITOR) 80 MG tablet Take 1 tablet by mouth once daily 90 tablet 0   blood glucose meter kit and supplies Dispense based on patient and insurance preference. Use up to four times daily as directed. (FOR ICD-10 E10.9, E11.9). 1 each 0   diclofenac (CATAFLAM) 50 MG tablet TAKE 1 TABLET BY MOUTH THREE TIMES DAILY 90 tablet 0   Dulaglutide (TRULICITY) 3 MG/0.5ML SOPN Inject 3 mg as directed once a week. 2 mL 2   ezetimibe (ZETIA) 10 MG tablet Take 1 tablet (10 mg total) by mouth every evening. 90 tablet 1   fenofibrate 160 MG tablet Take 1 tablet (160 mg total) by mouth daily. 90 tablet 2   glyBURIDE (DIABETA) 5 MG tablet TAKE 2 TABLETS BY MOUTH TWICE DAILY WITH A MEAL 180 tablet 0   losartan (COZAAR) 25 MG tablet Take 1 tablet (25 mg  total) by mouth daily. 90 tablet 3   metFORMIN (GLUCOPHAGE) 500 MG tablet TAKE 2 TABLETS BY MOUTH IN THE MORNING AND 2 IN THE EVENING 360 tablet 0   metoprolol tartrate (LOPRESSOR) 25 MG tablet Take 1 tablet by mouth twice daily 180 tablet 0   No current facility-administered medications on file prior to visit.    LABS/IMAGING: No results found for this or any previous visit (from the past 48 hour(s)). No results found.  LIPID PANEL:    Component Value Date/Time   CHOL 163 05/03/2022 0838   TRIG 538 (H) 05/03/2022 0838   HDL 24 (L) 05/03/2022  3570   CHOLHDL 6.8 (H) 05/03/2022 0838   CHOLHDL 4 05/07/2013 0855   VLDL 47.0 (H) 05/07/2013 0855   LDLCALC 58 05/03/2022 0838   LDLDIRECT 55.5 05/07/2013 0855    WEIGHTS: Wt Readings from Last 3 Encounters:  07/04/22 188 lb 3.2 oz (85.4 kg)  05/03/22 189 lb 6.4 oz (85.9 kg)  03/14/22 190 lb 12.8 oz (86.5 kg)    VITALS: BP (!) 158/92 (BP Location: Left Arm, Patient Position: Sitting, Cuff Size: Normal)   Pulse 70   Ht 5\' 7"  (1.702 m)   Wt 188 lb 3.2 oz (85.4 kg)   SpO2 99%   BMI 29.48 kg/m   EXAM: Deferred  EKG: Deferred  ASSESSMENT: Mixed dyslipidemia, goal LDL <70 High triglycerides DM2- A1c 10.1% CAD s/p CABG Bilateral carotid stenosis  PLAN: 1.   Mario Arnold has a mixed dyslipidemia and was at goal cholesterol about 9 months ago.  When his lipids were retested 2 months ago his triglycerides were very high and he has had an issue with this in the past.  This is mirroring his A1c which went up from 6.2 to 10.1%.  The main issue is glycemic control and diet.  He said he was doing much better but had slipped with regards to his diet.  He is not also very physically active.  He was also taking Trulicity but was off of that for a while and recently restarted it due to cost issues.  We spoke at length about the importance of dietary compliance for both his diabetes and cholesterol.  Will plan to repeat lipid in 6 months with no  changes in his meds today.  Chrystie Nose, MD, [facility], FACP  [place]  [facility]  Medical Director of the Advanced Lipid Disorders &  Cardiovascular Risk Reduction Clinic Diplomate of the American Board of Clinical Lipidology Attending Cardiologist  Direct Dial: [phone]  Fax: [phone]  Website:  [email or web address] Vaibhav [email or web address] 07/04/2022, 9:52 AM

## 2022-07-08 ENCOUNTER — Other Ambulatory Visit: Payer: Self-pay | Admitting: Nurse Practitioner

## 2022-07-08 DIAGNOSIS — E119 Type 2 diabetes mellitus without complications: Secondary | ICD-10-CM

## 2022-07-11 ENCOUNTER — Other Ambulatory Visit (INDEPENDENT_AMBULATORY_CARE_PROVIDER_SITE_OTHER): Payer: Medicare Other

## 2022-07-11 ENCOUNTER — Ambulatory Visit: Payer: Medicare Other | Admitting: Orthopedic Surgery

## 2022-07-11 ENCOUNTER — Encounter: Payer: Self-pay | Admitting: Orthopedic Surgery

## 2022-07-11 VITALS — BP 119/72 | HR 82 | Ht 67.0 in | Wt 189.0 lb

## 2022-07-11 DIAGNOSIS — R2689 Other abnormalities of gait and mobility: Secondary | ICD-10-CM | POA: Diagnosis not present

## 2022-07-11 DIAGNOSIS — M48062 Spinal stenosis, lumbar region with neurogenic claudication: Secondary | ICD-10-CM

## 2022-07-11 DIAGNOSIS — M5116 Intervertebral disc disorders with radiculopathy, lumbar region: Secondary | ICD-10-CM

## 2022-07-11 DIAGNOSIS — M545 Low back pain, unspecified: Secondary | ICD-10-CM

## 2022-07-11 DIAGNOSIS — G8929 Other chronic pain: Secondary | ICD-10-CM

## 2022-07-11 NOTE — Progress Notes (Signed)
Orthopedic Spine Surgery Office Note  Assessment: Patient is a 67 y.o. male with bilateral buttock and posterior thigh pain. Pain improves when sitting or flexing the lumbar spine   Plan: -Patient has tried activity modification, tylenol, hydrocodone, lumbar steroid injections  -Patient has tried over six weeks of conservative treatment without any relief so recommended MRI of the lumbar spine to evaluate for lumbar stenosis -Would need to be nicotine free and have an A1C of 7.5 or less before surgery would be offered as a treatment option -Patient should return to office in 6 weeks, x-rays at next visit: none   Patient expressed understanding of the plan and all questions were answered to the patient's satisfaction.   ___________________________________________________________________________   History:  Patient is a 67 y.o. male who presents today for lumbar spine. Patient has had several years of progressively worsening bilateral buttock and posterior thigh pain. Pain is felt when is standing up for awhile or walking. He states he used to be able to stand for 10-15 minutes before having to sit down but now he can only last 2-3 minutes. He does note it gets better if he leans forward. Does not have much back pain. There was no trauma or injury the preceded the onset of pain. Denies paresthesias and numbness.   Of note, he has had ABIs done on 10/11/2021 that were 1.13  on the left and 1.36 on the right   Weakness: yes, has noticed weakness with ankle dorsiflexion and toe extension Symptoms of imbalance: yes, has had issues since the foot drop started bilaterally Clumsiness with hands: denies Paresthesias and numbness: denies Bowel or bladder incontinence: denies Saddle anesthesia: denies  Treatments tried: activity modification, tylenol, hydrocodone, lumbar steroid injections   Review of systems: Denies fevers and chills, night sweats, unexplained weight loss, history of cancer.  Has had pain that wakes him at night  Past medical history: HLD CAD DM (last A1C was 10.1 on 05/03/2022) MI GERD History of kidney stones  Allergies: NSAIDs, dilaudid  Past surgical history:  Coronary stent placement CABG Right shoulder rotator cuff repair  Social history: Reports use of nicotine product (smoking, vaping, patches, smokeless) Alcohol use: rare Denies recreational drug use   Physical Exam:  BMI of 29.6  General: no acute distress, appears stated age Neurologic: alert, answering questions appropriately, following commands Respiratory: unlabored breathing on room air, symmetric chest rise Psychiatric: appropriate affect, normal cadence to speech   MSK (spine):  -Strength exam      Left  Right EHL    3/5  4/5 TA    4/5  3/5 GSC    5/5  5/5 Knee extension  5/5  5/5 Hip flexion   5/5  5/5  -Sensory exam    Sensation intact to light touch in L3-S1 nerve distributions of bilateral lower extremities  -Achilles DTR: 2/4 on the left, 2/4 on the right -Patellar tendon DTR: 2/4 on the left, 2/4 on the right  -Straight leg raise: negative bilaterally -Femoral nerve stretch test: negative bilaterally -Clonus: no beats bilaterally -Negative hoffman bilaterally -Negative grip and release test   -Left hip exam: no pain through range of motion, negative stinchfield, negative faber -Right hip exam: no pain through range of motion, negative stinchfield, negative faber  Imaging: XR of the lumbar spine from 07/11/2022 was independently reviewed and interpreted, showing disc height loss at L1/2, L4/5, L5/S1. No fracture or dislocation seen. No evidence of instability on flexion/extension views.    Patient name: Mario Arnold  Suppes Patient MRN: [id number] Date of visit: 07/11/22

## 2022-07-13 ENCOUNTER — Telehealth: Payer: Self-pay | Admitting: Nurse Practitioner

## 2022-07-13 DIAGNOSIS — E119 Type 2 diabetes mellitus without complications: Secondary | ICD-10-CM

## 2022-07-18 NOTE — Telephone Encounter (Signed)
Pt is calling wanting to know why his Rx was denied. Pharmacy advised the pt that he couldn't skip a dose so he is trying to get the next dose higher.  Please advise

## 2022-07-18 NOTE — Telephone Encounter (Signed)
Called pt he said that he will call around to pharmacies to see who has the medication and he will contact the office

## 2022-07-19 ENCOUNTER — Other Ambulatory Visit: Payer: Self-pay | Admitting: Nurse Practitioner

## 2022-07-19 DIAGNOSIS — E119 Type 2 diabetes mellitus without complications: Secondary | ICD-10-CM

## 2022-07-19 MED ORDER — TRULICITY 1.5 MG/0.5ML ~~LOC~~ SOAJ
1.5000 mg | SUBCUTANEOUS | 2 refills | Status: DC
Start: 2022-07-19 — End: 2022-07-21

## 2022-07-19 NOTE — Telephone Encounter (Signed)
Per Herbert Seta she has sent a new Rx to the pharmacy

## 2022-07-19 NOTE — Telephone Encounter (Signed)
Patient called office to inform that his pharmacy [facility] on [address] has Trulicity in stock, they's like to know if patient needs low or high dosage, patient states that they will send request via fax, thanks.

## 2022-07-21 ENCOUNTER — Ambulatory Visit (HOSPITAL_COMMUNITY)
Admission: RE | Admit: 2022-07-21 | Discharge: 2022-07-21 | Disposition: A | Discharge status: Home / Self Care | Payer: Medicare Other | Source: Ambulatory Visit | Attending: Orthopedic Surgery | Admitting: Orthopedic Surgery

## 2022-07-21 ENCOUNTER — Telehealth: Payer: Self-pay

## 2022-07-21 ENCOUNTER — Other Ambulatory Visit: Payer: Self-pay | Admitting: Nurse Practitioner

## 2022-07-21 DIAGNOSIS — M48062 Spinal stenosis, lumbar region with neurogenic claudication: Secondary | ICD-10-CM | POA: Insufficient documentation

## 2022-07-21 DIAGNOSIS — M48061 Spinal stenosis, lumbar region without neurogenic claudication: Secondary | ICD-10-CM | POA: Diagnosis not present

## 2022-07-21 DIAGNOSIS — E119 Type 2 diabetes mellitus without complications: Secondary | ICD-10-CM

## 2022-07-21 MED ORDER — SEMAGLUTIDE (1 MG/DOSE) 4 MG/3ML ~~LOC~~ SOPN
1.0000 mg | PEN_INJECTOR | SUBCUTANEOUS | 2 refills | Status: DC
Start: 2022-07-21 — End: 2022-08-15

## 2022-07-21 NOTE — Telephone Encounter (Signed)
Patient called office to inform that pharmacy [facility] only has the low and high dosages of his Trulicity they dont' have the middle dosage that patient takes, patient states that he is currently out of this medication, please advise, thanks.

## 2022-07-21 NOTE — Telephone Encounter (Signed)
Called pt LVM to contact the office °

## 2022-07-21 NOTE — Telephone Encounter (Signed)
Per Herbert Seta please call the pt and ask him to call the pharmacy and see do they have OZEMPIC  or [facility]

## 2022-07-21 NOTE — Telephone Encounter (Signed)
Please let the patient know that I changed the prescription to ozempic 1 mg weekly. [facility].  Thanks  -HB

## 2022-07-28 ENCOUNTER — Telehealth: Payer: Self-pay | Admitting: Orthopedic Surgery

## 2022-07-28 MED ORDER — HYDROCODONE-ACETAMINOPHEN 5-325 MG PO TABS: 1.0000 | ORAL_TABLET | Freq: Four times a day (QID) | ORAL | 0 refills | Status: AC | PRN

## 2022-07-28 MED ORDER — DICLOFENAC SODIUM 50 MG PO TBEC: 50.0000 mg | DELAYED_RELEASE_TABLET | Freq: Three times a day (TID) | ORAL | 1 refills | Status: DC | PRN

## 2022-07-28 NOTE — Telephone Encounter (Signed)
Pt called requesting refills of hydrocodone and diclofenac for his back pains. Please send to [facility]. Pt phone number is [phone].

## 2022-07-29 ENCOUNTER — Telehealth: Payer: Self-pay | Admitting: Orthopedic Surgery

## 2022-07-29 NOTE — Telephone Encounter (Signed)
I called and advised patient that his meds were sent in yesterday

## 2022-07-29 NOTE — Telephone Encounter (Signed)
Patient asking for a refill on his Hydrocodone and also Diclofenac please advise patient advising that he had called two days ago and he is leaving to go out of town Sunday and asking to be able to get it today. Please advise.

## 2022-08-09 ENCOUNTER — Ambulatory Visit: Payer: Medicare Other | Admitting: Nurse Practitioner

## 2022-08-14 NOTE — Progress Notes (Unsigned)
Established patient visit   Patient: Mario Arnold   DOB: 14-Apr-1955   67 y.o. Male  MRN: [id number] Visit Date: 08/15/2022   No chief complaint on file.  Subjective    HPI  Follow up  -type 2 diabetes  -HgbA1c today is -  -urine microalbumin today -     Medications: Outpatient Medications Prior to Visit  Medication Sig   Semaglutide, 1 MG/DOSE, 4 MG/3ML SOPN Inject 1 mg as directed once a week.   acetaminophen (TYLENOL) 500 MG tablet Take 1,000 mg by mouth every 8 (eight) hours as needed for mild pain.   aspirin EC 81 MG tablet Take 1 tablet (81 mg total) by mouth daily. Swallow whole.   atorvastatin (LIPITOR) 80 MG tablet Take 1 tablet by mouth once daily   blood glucose meter kit and supplies Dispense based on patient and insurance preference. Use up to four times daily as directed. (FOR ICD-10 E10.9, E11.9).   diclofenac (CATAFLAM) 50 MG tablet TAKE 1 TABLET BY MOUTH THREE TIMES DAILY   diclofenac (VOLTAREN) 50 MG EC tablet Take 1 tablet (50 mg total) by mouth 3 (three) times daily as needed for mild pain or moderate pain.   ezetimibe (ZETIA) 10 MG tablet Take 1 tablet (10 mg total) by mouth every evening.   fenofibrate 160 MG tablet Take 1 tablet (160 mg total) by mouth daily.   glyBURIDE (DIABETA) 5 MG tablet TAKE 2 TABLETS BY MOUTH TWICE DAILY WITH A MEAL   losartan (COZAAR) 25 MG tablet Take 1 tablet (25 mg total) by mouth daily.   metFORMIN (GLUCOPHAGE) 500 MG tablet TAKE 2 TABLETS BY MOUTH IN THE MORNING AND 2 IN THE EVENING   metoprolol tartrate (LOPRESSOR) 25 MG tablet Take 1 tablet by mouth twice daily   No facility-administered medications prior to visit.    Review of Systems  Last CBC Lab Results  Component Value Date   WBC 4.8 05/03/2022   HGB 15.3 05/03/2022   HCT 46.3 05/03/2022   MCV 85 05/03/2022   MCH 28.1 05/03/2022   RDW 13.6 05/03/2022   PLT 168 05/03/2022   Last metabolic panel Lab Results  Component Value Date   GLUCOSE 307 (H) 05/03/2022    NA 134 05/03/2022   K 5.2 05/03/2022   CL 100 05/03/2022   CO2 20 05/03/2022   BUN 20 05/03/2022   CREATININE 1.38 (H) 05/03/2022   EGFR 57 (L) 05/03/2022   CALCIUM 9.6 05/03/2022   PROT 7.1 05/03/2022   ALBUMIN 4.6 05/03/2022   LABGLOB 2.5 05/03/2022   AGRATIO 1.8 05/03/2022   BILITOT 0.3 05/03/2022   ALKPHOS 118 05/03/2022   AST 35 05/03/2022   ALT 47 (H) 05/03/2022   ANIONGAP 8 10/17/2021   Last lipids Lab Results  Component Value Date   CHOL 163 05/03/2022   HDL 24 (L) 05/03/2022   LDLCALC 58 05/03/2022   LDLDIRECT 55.5 05/07/2013   TRIG 538 (H) 05/03/2022   CHOLHDL 6.8 (H) 05/03/2022   Last hemoglobin A1c Lab Results  Component Value Date   HGBA1C 10.1 (H) 05/03/2022   Last thyroid functions Lab Results  Component Value Date   TSH 3.730 05/03/2022        Objective    There were no vitals filed for this visit. There is no height or weight on file to calculate BMI.  BP Readings from Last 3 Encounters:  07/11/22 119/72  07/04/22 (Abnormal) 158/92  05/03/22 124/79    Wt Readings from Last  3 Encounters:  07/11/22 189 lb (85.7 kg)  07/04/22 188 lb 3.2 oz (85.4 kg)  05/03/22 189 lb 6.4 oz (85.9 kg)    Physical Exam  ***  No results found for any visits on 08/15/22.  Assessment & Plan    There are no diagnoses linked to this encounter.   Return in about 3 months (around 11/15/2022) for diabetes with HgbA1c check.         Carlean Jews, NP  [facility] [phone] (phone) [phone] (fax)  [facility]

## 2022-08-15 ENCOUNTER — Ambulatory Visit (INDEPENDENT_AMBULATORY_CARE_PROVIDER_SITE_OTHER): Payer: Medicare Other | Admitting: Nurse Practitioner

## 2022-08-15 ENCOUNTER — Encounter: Payer: Self-pay | Admitting: Nurse Practitioner

## 2022-08-15 VITALS — BP 121/78 | HR 70 | Ht 67.0 in | Wt 179.4 lb

## 2022-08-15 DIAGNOSIS — E1159 Type 2 diabetes mellitus with other circulatory complications: Secondary | ICD-10-CM | POA: Diagnosis not present

## 2022-08-15 DIAGNOSIS — E782 Mixed hyperlipidemia: Secondary | ICD-10-CM

## 2022-08-15 DIAGNOSIS — M5116 Intervertebral disc disorders with radiculopathy, lumbar region: Secondary | ICD-10-CM | POA: Diagnosis not present

## 2022-08-15 DIAGNOSIS — I25119 Atherosclerotic heart disease of native coronary artery with unspecified angina pectoris: Secondary | ICD-10-CM | POA: Diagnosis not present

## 2022-08-15 DIAGNOSIS — E1169 Type 2 diabetes mellitus with other specified complication: Secondary | ICD-10-CM | POA: Diagnosis not present

## 2022-08-15 DIAGNOSIS — I152 Hypertension secondary to endocrine disorders: Secondary | ICD-10-CM | POA: Diagnosis not present

## 2022-08-15 DIAGNOSIS — E119 Type 2 diabetes mellitus without complications: Secondary | ICD-10-CM

## 2022-08-15 LAB — POCT UA - MICROALBUMIN
Albumin/Creatinine Ratio, Urine, POC: <30
Creatinine, POC: 200 mg/dL
Microalbumin Ur, POC: 30 mg/L

## 2022-08-15 LAB — POCT GLYCOSYLATED HEMOGLOBIN (HGB A1C): Hemoglobin A1C: 9.1 % — AB (ref 4.0–5.6)

## 2022-08-15 MED ORDER — OZEMPIC (1 MG/DOSE) 2 MG/1.5ML ~~LOC~~ SOPN
2.0000 mg | PEN_INJECTOR | SUBCUTANEOUS | 2 refills | Status: DC
Start: 2022-08-15 — End: 2022-10-03

## 2022-08-15 MED ORDER — GLYBURIDE 5 MG PO TABS
ORAL_TABLET | ORAL | 0 refills | Status: DC
Start: 2022-08-15 — End: 2022-08-23

## 2022-08-15 MED ORDER — METFORMIN HCL 500 MG PO TABS
ORAL_TABLET | ORAL | 0 refills | Status: DC
Start: 2022-08-15 — End: 2022-11-22

## 2022-08-15 NOTE — Assessment & Plan Note (Signed)
Conitnue lipid lowering medications as prescribed

## 2022-08-15 NOTE — Assessment & Plan Note (Addendum)
HgbA1c 9.1 today, down from 10.1 at last check  -normal urine microalbumin  -increase ozempic tp 2 mg weekly  -continue other diabetic medication as prescribed  -recheck Hgba1c in 3 months.

## 2022-08-15 NOTE — Assessment & Plan Note (Signed)
Stable.  Continue current medication

## 2022-08-15 NOTE — Assessment & Plan Note (Signed)
Patient is currently stable.  Continue current medication.

## 2022-08-15 NOTE — Assessment & Plan Note (Signed)
Followed per orthopedics/neurosurgery

## 2022-08-18 ENCOUNTER — Telehealth: Payer: Self-pay

## 2022-08-18 NOTE — Telephone Encounter (Signed)
Called pt he is advised that cover my meds offer an alternative of the glyburide provider is advised of this matter

## 2022-08-18 NOTE — Telephone Encounter (Signed)
Patient called concerning his medication glyBURIDE, patient states that his pharmacy [facility] on [address] did not receive his refill, please advise, thanks.

## 2022-08-19 ENCOUNTER — Other Ambulatory Visit: Payer: Self-pay | Admitting: Nurse Practitioner

## 2022-08-19 DIAGNOSIS — E119 Type 2 diabetes mellitus without complications: Secondary | ICD-10-CM

## 2022-08-23 ENCOUNTER — Other Ambulatory Visit: Payer: Self-pay | Admitting: Nurse Practitioner

## 2022-08-23 DIAGNOSIS — E119 Type 2 diabetes mellitus without complications: Secondary | ICD-10-CM

## 2022-08-23 MED ORDER — GLIPIZIDE 10 MG PO TABS
10.0000 mg | ORAL_TABLET | Freq: Two times a day (BID) | ORAL | 3 refills | Status: DC
Start: 2022-08-23 — End: 2022-12-19

## 2022-08-25 ENCOUNTER — Other Ambulatory Visit: Payer: Self-pay | Admitting: Nurse Practitioner

## 2022-08-26 NOTE — Addendum Note (Signed)
Addended by: Vincent Gros on: 08/26/2022 06:25 AM   Modules accepted: Level of Service

## 2022-08-31 ENCOUNTER — Other Ambulatory Visit: Payer: Self-pay | Admitting: Nurse Practitioner

## 2022-09-21 ENCOUNTER — Other Ambulatory Visit: Payer: Self-pay | Admitting: Orthopedic Surgery

## 2022-09-26 ENCOUNTER — Telehealth: Payer: Self-pay | Admitting: *Deleted

## 2022-09-26 NOTE — Telephone Encounter (Signed)
Pt calling to check on status of a refill.  Told him that it looks like we haven't filled it since 2022. Told him I would send to provider to review and see if indeed he needs to be taking this as well. He would like a refill if appropriate.  Please advise.   ezetimibe (ZETIA) 10 MG tablet        [id number] - [place] ([place]), [place] - [address]

## 2022-09-27 ENCOUNTER — Other Ambulatory Visit: Payer: Self-pay | Admitting: *Deleted

## 2022-09-27 DIAGNOSIS — E785 Hyperlipidemia, unspecified: Secondary | ICD-10-CM

## 2022-09-27 MED ORDER — EZETIMIBE 10 MG PO TABS
10.0000 mg | ORAL_TABLET | Freq: Every evening | ORAL | 1 refills | Status: DC
Start: 2022-09-27 — End: 2022-11-21

## 2022-09-27 NOTE — Telephone Encounter (Signed)
Refilled below medication per provider in conversation below. Sandre Kitty, MD  You2 minutes ago (10:12 AM)    30 days with a refill is ok since the appointment is 6 weeks away   You  Sandre Kitty, MD16 minutes ago (9:57 AM)    He said it has been about 2 weeks and he has appointment scheduled in August with you.   Sandre Kitty, MD  You42 minutes ago (9:32 AM)    Ask him when he last took it, but yes we can refill it.  We have recent lipid and cmp labs on him.  He just needs an appointment with [place] soon.  He can get a 30 day supply until then though.

## 2022-10-01 ENCOUNTER — Other Ambulatory Visit: Payer: Self-pay | Admitting: Nurse Practitioner

## 2022-10-01 DIAGNOSIS — E119 Type 2 diabetes mellitus without complications: Secondary | ICD-10-CM

## 2022-10-18 ENCOUNTER — Other Ambulatory Visit: Payer: Self-pay | Admitting: Orthopedic Surgery

## 2022-11-10 ENCOUNTER — Other Ambulatory Visit: Payer: Self-pay | Admitting: Nurse Practitioner

## 2022-11-10 DIAGNOSIS — E119 Type 2 diabetes mellitus without complications: Secondary | ICD-10-CM

## 2022-11-15 ENCOUNTER — Encounter: Payer: Self-pay | Admitting: Family Medicine

## 2022-11-15 ENCOUNTER — Ambulatory Visit: Payer: Medicare Other | Admitting: Family Medicine

## 2022-11-15 VITALS — BP 125/77 | HR 69 | Ht 67.0 in | Wt 178.8 lb

## 2022-11-15 DIAGNOSIS — Z7984 Long term (current) use of oral hypoglycemic drugs: Secondary | ICD-10-CM

## 2022-11-15 DIAGNOSIS — E782 Mixed hyperlipidemia: Secondary | ICD-10-CM | POA: Diagnosis not present

## 2022-11-15 DIAGNOSIS — E1169 Type 2 diabetes mellitus with other specified complication: Secondary | ICD-10-CM | POA: Diagnosis not present

## 2022-11-15 DIAGNOSIS — E119 Type 2 diabetes mellitus without complications: Secondary | ICD-10-CM | POA: Diagnosis not present

## 2022-11-15 LAB — POCT GLYCOSYLATED HEMOGLOBIN (HGB A1C): HbA1c POC (<> result, manual entry): 9.1 % (ref 4.0–5.6)

## 2022-11-15 MED ORDER — INSULIN GLARGINE 100 UNIT/ML SOLOSTAR PEN: 10.0000 [IU] | PEN_INJECTOR | Freq: Every day | SUBCUTANEOUS | 1 refills | Status: DC

## 2022-11-15 NOTE — Progress Notes (Unsigned)
   Established Patient Office Visit  Subjective   Patient ID: Mario Arnold, male    DOB: 06-Mar-1956  Age: 67 y.o. MRN: [id number]  Chief Complaint  Patient presents with   Medical Management of Chronic Issues    HPI Dm2 -patient last took his Ozempic last Friday.  He is currently out of Ozempic.  He is unable to get a refill of it because he is in the "donut hole" and cost would be prohibitive at this time.  He is open to starting insulin.  He has been prescribed Jardiance in the past but it was also too expensive at the time.  Patient checks his blood sugar routinely.  Last checked it this morning and it was "121".  Patient taking metformin and glipizide.  Patient states his diet is meat and vegetables.  Will sometimes eat fresh fruit for snacking but tries to avoid excess sugar.  Does not drink any sugary drinks.   The ASCVD Risk score (Arnett DK, et al., 2019) failed to calculate for the following reasons:   The patient has a prior MI or stroke diagnosis  Health Maintenance Due  Topic Date Due   FOOT EXAM  Never done   OPHTHALMOLOGY EXAM  Never done   HIV Screening  Never done   Hepatitis C Screening  Never done   COVID-19 Vaccine (1 - 2023-24 season) Never done   Pneumonia Vaccine 70+ Years old (2 of 2 - PCV) 05/29/2022   INFLUENZA VACCINE  10/27/2022      Objective:     BP 125/77   Pulse 69   Ht 5\' 7"  (1.702 m)   Wt 178 lb 12.8 oz (81.1 kg)   SpO2 98%   BMI 28.00 kg/m  {Vitals History (Optional):23777}  Physical Exam General: Alert, oriented Pulmonary: No respiratory distress Psych: Pleasant affect MSK: Antalgic gait.   Results for orders placed or performed in visit on 11/15/22  POCT HgB A1C  Result Value Ref Range   Hemoglobin A1C     HbA1c POC (<> result, manual entry) 9.1 4.0 - 5.6 %   HbA1c, POC (prediabetic range)     HbA1c, POC (controlled diabetic range)          Assessment & Plan:   Mixed diabetic hyperlipidemia associated with type 2  diabetes mellitus (HCC)  Diabetes mellitus type 2 in nonobese [facility]) Assessment & Plan: A1c 9.1, same as 3 months ago.  Has been taking the Ozempic 1 mg dose up until a week ago so lack of improvement not due to not taking the Ozempic.  Will need to go up on this when he can afford it again.  Will work on talking to someone from [employer] regarding pricing options.  May be eligible for Medicare assistance programs based on his income as well. - Prescribing insulin glargine 10 units every morning. - Discussed checking his blood sugar levels fasting in the morning before giving insulin and holding if less than 100.  Did not discuss titrating up dose at this time. - Follow-up in 3 months  Orders: -     POCT glycosylated hemoglobin (Hb A1C)  Other orders -     Insulin Glargine; Inject 10 Units into the skin daily.  Dispense: 15 mL; Refill: 1     Return in about 3 months (around 02/15/2023) for DM.    Sandre Kitty, MD

## 2022-11-15 NOTE — Assessment & Plan Note (Signed)
A1c 9.1, same as 3 months ago.  Has been taking the Ozempic 1 mg dose up until a week ago so lack of improvement not due to not taking the Ozempic.  Will need to go up on this when he can afford it again.  Will work on talking to someone from [employer] regarding pricing options.  May be eligible for Medicare assistance programs based on his income as well. - Prescribing insulin glargine 10 units every morning. - Discussed checking his blood sugar levels fasting in the morning before giving insulin and holding if less than 100.  Did not discuss titrating up dose at this time. - Follow-up in 3 months

## 2022-11-15 NOTE — Patient Instructions (Signed)
It was nice to see you today,  We addressed the following topics today: - I have ordered insulin for you while we figure out what to do with your ozempic costs.   - inject 10 Units of insulin in the morning after you have checked your fasting blood sugar and confirmed it is > 100.  If it is below 100 do not give yourself insulin that day.  - I will call someone from the manufacturer to see if there are other options for your ozempic costs.    Have a great day,  Frederic Jericho, MD

## 2022-11-20 ENCOUNTER — Other Ambulatory Visit: Payer: Self-pay | Admitting: Family Medicine

## 2022-11-20 DIAGNOSIS — E785 Hyperlipidemia, unspecified: Secondary | ICD-10-CM

## 2022-11-22 ENCOUNTER — Other Ambulatory Visit: Payer: Self-pay | Admitting: Family Medicine

## 2022-11-22 ENCOUNTER — Other Ambulatory Visit: Payer: Self-pay | Admitting: Nurse Practitioner

## 2022-11-22 DIAGNOSIS — E119 Type 2 diabetes mellitus without complications: Secondary | ICD-10-CM

## 2022-11-22 MED ORDER — METFORMIN HCL 500 MG PO TABS
ORAL_TABLET | ORAL | 3 refills | Status: DC
Start: 2022-11-22 — End: 2024-01-29

## 2022-12-13 ENCOUNTER — Other Ambulatory Visit: Payer: Self-pay | Admitting: Family Medicine

## 2022-12-17 ENCOUNTER — Other Ambulatory Visit: Payer: Self-pay | Admitting: Nurse Practitioner

## 2022-12-17 DIAGNOSIS — E119 Type 2 diabetes mellitus without complications: Secondary | ICD-10-CM

## 2022-12-18 ENCOUNTER — Other Ambulatory Visit: Payer: Self-pay | Admitting: Family Medicine

## 2022-12-18 DIAGNOSIS — E785 Hyperlipidemia, unspecified: Secondary | ICD-10-CM

## 2023-01-15 ENCOUNTER — Other Ambulatory Visit: Payer: Self-pay | Admitting: Family Medicine

## 2023-01-15 DIAGNOSIS — E119 Type 2 diabetes mellitus without complications: Secondary | ICD-10-CM

## 2023-01-17 ENCOUNTER — Telehealth: Payer: Self-pay | Admitting: *Deleted

## 2023-01-17 ENCOUNTER — Other Ambulatory Visit: Payer: Self-pay | Admitting: Family Medicine

## 2023-01-17 MED ORDER — PEN NEEDLES 31G X 5 MM MISC: 3 refills | Status: DC

## 2023-01-17 NOTE — Telephone Encounter (Addendum)
Pt calling stating that he needs refills on his pen needles.  He said pharmacy had reached out to [place] and has not heard anything, I did not see any communication on this. He would like this to go to [facility] on [address]

## 2023-01-17 NOTE — Telephone Encounter (Signed)
I do not see anything regarding size of pen needles anywhere on his chart.  I ordered 31-gauge by 5 mm pen needles.  If he needs a different size they will need to let us know.

## 2023-01-17 NOTE — Telephone Encounter (Signed)
Pt informed of below.  

## 2023-01-28 ENCOUNTER — Other Ambulatory Visit: Payer: Self-pay | Admitting: Cardiology

## 2023-01-28 DIAGNOSIS — E785 Hyperlipidemia, unspecified: Secondary | ICD-10-CM

## 2023-02-02 ENCOUNTER — Other Ambulatory Visit: Payer: Self-pay | Admitting: Nurse Practitioner

## 2023-02-02 DIAGNOSIS — I25119 Atherosclerotic heart disease of native coronary artery with unspecified angina pectoris: Secondary | ICD-10-CM

## 2023-02-02 NOTE — Telephone Encounter (Signed)
Good morning

## 2023-02-14 ENCOUNTER — Other Ambulatory Visit: Payer: Self-pay | Admitting: Family Medicine

## 2023-02-14 DIAGNOSIS — E785 Hyperlipidemia, unspecified: Secondary | ICD-10-CM

## 2023-02-15 ENCOUNTER — Other Ambulatory Visit: Payer: Self-pay | Admitting: Family Medicine

## 2023-02-15 DIAGNOSIS — E119 Type 2 diabetes mellitus without complications: Secondary | ICD-10-CM

## 2023-02-17 ENCOUNTER — Encounter: Payer: Self-pay | Admitting: Family Medicine

## 2023-02-17 ENCOUNTER — Ambulatory Visit (INDEPENDENT_AMBULATORY_CARE_PROVIDER_SITE_OTHER): Payer: Medicare Other | Admitting: Family Medicine

## 2023-02-17 ENCOUNTER — Telehealth: Payer: Self-pay

## 2023-02-17 VITALS — BP 116/77 | HR 64 | Temp 98.1°F | Ht 67.0 in | Wt 177.8 lb

## 2023-02-17 DIAGNOSIS — E119 Type 2 diabetes mellitus without complications: Secondary | ICD-10-CM

## 2023-02-17 MED ORDER — FREESTYLE LIBRE 3 SENSOR MISC: 3 refills | Status: DC

## 2023-02-17 MED ORDER — INSULIN GLARGINE 100 UNIT/ML SOLOSTAR PEN: 16.0000 [IU] | PEN_INJECTOR | Freq: Every day | SUBCUTANEOUS | 3 refills | Status: DC

## 2023-02-17 NOTE — Patient Instructions (Addendum)
It was nice to see you today,  We addressed the following topics today: -I would like you to increase your insulin to 16 units in the morning.   - Check your blood sugar while fasting first thing in the morning.  If that number is greater than 140 increase your insulin dosing by 2 units.  If it is between 100-140 keep it at the same level.  If it is less than 100 decrease it by 2 units.  If it is less than 70 do not give yourself insulin that day and let us know. - If you feel you are getting hypoglycemic during the day you can always divide your insulin down to 2 equal doses (40 units in the morning can be switched to 20 units twice a day) - Your A1c was 12.4. - I will send in a referral to case management.  A pharmacist or social worker will call you to help you with finding affordable options for your medications while you are in the donut hole. - Make sure you are eating within an hour of taking your glipizide.  Have a great day,  Frederic Jericho, MD

## 2023-02-17 NOTE — Progress Notes (Signed)
   Care Guide Note  02/17/2023 Name: Mario Arnold MRN: [id number] DOB: [birth year]  Referred by: Sandre Kitty, MD Reason for referral : Care Coordination (Outreach to schedule with Pharm d )   Mario Arnold is a 67 y.o. year old male who is a primary care patient of Sandre Kitty, MD. [name] was referred to the pharmacist for assistance related to DM.    Successful contact was made with the patient to discuss pharmacy services including being ready for the pharmacist to call at least 5 minutes before the scheduled appointment time, to have medication bottles and any blood sugar or blood pressure readings ready for review. The patient agreed to meet with the pharmacist via with the pharmacist via telephone visit on (date/time).  03/01/2023  Penne Lash , RMA     [place]  [facility] Guide  Direct Dial: [phone]  Website: [email or web address]

## 2023-02-17 NOTE — Progress Notes (Signed)
   Established Patient Office Visit  Subjective   Patient ID: SHONTE KUMP, male    DOB: 1955-04-26  Age: 67 y.o. MRN: [id number]  Chief Complaint  Patient presents with   Diabetes    HPI  DM2-patient has been taking his insulin in the morning.  Blood sugars have been elevated.  Has not adjusted his dose of insulin at all.  Still taking metformin and glipizide.  Does not really have any low blood sugars unless he skips meals.  Patient okay with referral to pharmacist for medication assistance.  We discussed titrating up his insulin dose on its own and we discussed continuous glucose monitoring.    The ASCVD Risk score (Arnett DK, et al., 2019) failed to calculate for the following reasons:   The patient has a prior MI or stroke diagnosis  Health Maintenance Due  Topic Date Due   OPHTHALMOLOGY EXAM  Never done   HIV Screening  Never done   Hepatitis C Screening  Never done   Pneumonia Vaccine 106+ Years old (2 of 2 - PCV) 05/29/2022   COVID-19 Vaccine (1 - 2023-24 season) Never done      Objective:     BP 116/77   Pulse 64   Temp 98.1 F (36.7 C) (Oral)   Ht 5\' 7"  (1.702 m)   Wt 177 lb 12 oz (80.6 kg)   SpO2 97%   BMI 27.84 kg/m    Physical Exam General: Alert, oriented Pulmonary: No respiratory distress Extremities: Normal DP pulses bilaterally.  Normal sensation in the feet bilaterally.  No ulcerations or deformities.   No results found for any visits on 02/17/23.      Assessment & Plan:   Diabetes mellitus type 2 in nonobese [facility]) Assessment & Plan: A1c predictably shot up after insurance stopped paying for his Ozempic. - Normal foot exam - Advised patient on how to titrate up his morning insulin with a goal of between 100 and 140 fasting glucose. - Will send in CGM device order - Will send in referral to pharmacy for help with affordability of his medication including GLP-1 and SGLT2. - Follow-up 3 months or sooner if needed.  Orders: -     POCT  glycosylated hemoglobin (Hb A1C) -     AMB Referral [facility] Care Management  Other orders -     Insulin Glargine; Inject 16 Units into the skin daily.  Dispense: 15 mL; Refill: 3     Return in about 3 months (around 05/20/2023) for DM.    Sandre Kitty, MD

## 2023-02-17 NOTE — Assessment & Plan Note (Signed)
A1c predictably shot up after insurance stopped paying for his Ozempic. - Normal foot exam - Advised patient on how to titrate up his morning insulin with a goal of between 100 and 140 fasting glucose. - Will send in CGM device order - Will send in referral to pharmacy for help with affordability of his medication including GLP-1 and SGLT2. - Follow-up 3 months or sooner if needed.

## 2023-02-24 ENCOUNTER — Other Ambulatory Visit: Payer: Self-pay | Admitting: Cardiology

## 2023-03-01 ENCOUNTER — Other Ambulatory Visit: Payer: Self-pay | Admitting: Pharmacist

## 2023-03-01 DIAGNOSIS — E119 Type 2 diabetes mellitus without complications: Secondary | ICD-10-CM

## 2023-03-01 DIAGNOSIS — E1169 Type 2 diabetes mellitus with other specified complication: Secondary | ICD-10-CM

## 2023-03-01 NOTE — Progress Notes (Signed)
03/01/2023 Name: Mario Arnold MRN: [id number] DOB: 07-25-1955  Chief Complaint  Patient presents with   Diabetes    Mario Arnold is a 67 y.o. year old male who presented for a telephone visit.   They were referred to the pharmacist by their PCP for assistance in managing diabetes.    Subjective:  Care Team: Primary Care Provider: Sandre Kitty, MD ; Next Scheduled Visit: 05/22/23 Clinical Pharmacist: Marlowe Aschoff, PharmD  Medication Access/Adherence  Current Pharmacy:  [facility] [address] ([address] [place] - [address] [place] ([place]) [place] [address] Phone: [phone] Fax: [phone]   Patient reports affordability concerns with their medications: Yes - Ozempic Patient reports access/transportation concerns to their pharmacy: No  Patient reports adherence concerns with their medications:  No     Diabetes:  Current medications: Glipizide 10mg  twice a day, Lantus 16 U in AM, Metformin 500mg  4 tablets per day (max dose) Medications tried in the past: Trulicity 0.75mg  (backorder issues), Actos 45mg , Ozempic 1mg  (donut hole), Glyburide 10mg  twice a day (switch to Glipizide)  Freestyle Libre 3- uses Libreview app % Time CGM is active: 12%- newly placed on 02/27/23 in PM Average Glucose: 323 mg/dL Glucose Management Indicator: N/A  Glucose Variability: 14.1% (goal <36%) Time in Goal:  - Very high: 90% - High: 10% - In target: 0% - Below: 0%  Patient denies hypoglycemic s/sx including dizziness, shakiness, sweating. Patient denies hyperglycemic symptoms including polyuria, polydipsia, polyphagia, nocturia, neuropathy, blurred vision.  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  Current medication access support: [employer] Advantage  Hyperlipidemia/ASCVD Risk Reduction  Current lipid lowering medications: Atorvastatin 80mg , Zetia 10mg , Fenofibrate 160mg  Medications tried in the past: None  Antiplatelet regimen: Aspirin 81 mg  ASCVD History: TIA in 2013, CABG x3 in 2023 Family History: Mother MI + TIA Risk Factors: Diet, lifestyle   The ASCVD Risk score (Arnett DK, et al., 2019) failed to calculate for the following reasons:   The patient has a prior MI or stroke diagnosis    Objective:  Lab Results  Component Value Date   HGBA1C 9.1 11/15/2022    Lab Results  Component Value Date   CREATININE 1.38 (H) 05/03/2022   BUN 20 05/03/2022   NA 134 05/03/2022   K 5.2 05/03/2022   CL 100 05/03/2022   CO2 20 05/03/2022    Lab Results  Component Value Date   CHOL 163 05/03/2022   HDL 24 (L) 05/03/2022   LDLCALC 58 05/03/2022   LDLDIRECT 55.5 05/07/2013   TRIG 538 (H) 05/03/2022   CHOLHDL 6.8 (H) 05/03/2022    Medications Reviewed Today     Reviewed by Pollie Friar, RPH (Pharmacist) on 03/01/23 at 1015  Med List Status: <None>   Medication Order Taking? Sig Documenting Provider Last Dose Status Informant  acetaminophen (TYLENOL) 500 MG tablet [id number] Yes Take 1,000 mg by mouth every 8 (eight) hours as needed for mild pain. [provider] Taking Active Self  aspirin EC 81 MG tablet [id number] Yes Take 1 tablet (81 mg total) by mouth daily. Swallow whole. [place], Peter M, MD Taking Active Self  atorvastatin (LIPITOR) 80 MG tablet [id number] Yes Take 1 tablet by mouth once daily Sandre Kitty, MD Taking Active   blood glucose meter kit and supplies [id number] Yes Dispense based on patient and insurance preference. Use  up to four times daily as directed. (FOR ICD-10 E10.9, E11.9). Carlean Jews, NP Taking Active Self  Continuous Glucose Sensor (FREESTYLE LIBRE 3 SENSOR) [place] [id number] Yes Place 1 sensor on the skin every 14 days. Use to check glucose continuously Sandre Kitty, MD Taking Active   diclofenac (CATAFLAM) 50 MG tablet [id number] No TAKE 1 TABLET BY MOUTH THREE TIMES DAILY  Patient not taking: Reported on 02/17/2023   Kerrin Champagne, MD Not Taking Active   diclofenac (VOLTAREN) 50 MG EC tablet [id number] No TAKE 1 TABLET BY MOUTH THREE TIMES DAILY AS NEEDED FOR MILD PAIN OR MODERATE PAIN  Patient not taking: Reported on 02/17/2023   London Sheer, MD Not Taking Active   ezetimibe (ZETIA) 10 MG tablet [id number] Yes TAKE 1 TABLET BY MOUTH ONCE DAILY IN THE Terrill Mohr, MD Taking Active   fenofibrate 160 MG tablet [id number] Yes Take 1 tablet by mouth once daily [place], Peter M, MD Taking Active   glipiZIDE (GLUCOTROL) 10 MG tablet [id number] Yes TAKE 1 TABLET BY MOUTH TWICE DAILY BEFORE A MEAL Sandre Kitty, MD Taking Active   insulin glargine (LANTUS) 100 UNIT/ML Solostar Pen [id number] Yes Inject 16 Units into the skin daily. Sandre Kitty, MD Taking Active   Insulin Pen Needle (PEN NEEDLES) 31G X 5 MM MISC [id number] Yes Use with injection of Solostar insulin pen. Sandre Kitty, MD Taking Active   losartan (COZAAR) 25 MG tablet [id number] Yes Take 1 tablet by mouth once daily [place], Peter M, MD Taking Active   metFORMIN (GLUCOPHAGE) 500 MG tablet [id number] Yes TAKE 2 TABLETS BY MOUTH IN THE MORNING AND 2 IN THE Terrill Mohr, MD Taking Active   metoprolol tartrate (LOPRESSOR) 25 MG tablet [id number] Yes Take 1 tablet by mouth twice daily Sandre Kitty, MD Taking Active   [facility], 1 MG/DOSE, 4 MG/3ML Namon Cirri [id number] No INJECT 1 MG SUBCUTANEOUSLY  ONCE A WEEK  Patient not taking: Reported on 03/01/2023   Sandre Kitty, MD Not Taking Active                Assessment/Plan:   Diabetes: - Currently uncontrolled - Reviewed long term cardiovascular and renal outcomes of uncontrolled blood sugar - Reviewed goal A1c, goal fasting, and goal 2 hour post prandial glucose - Reviewed dietary modifications including limiting amount of toast and other carb-based foods - Patient denies personal or family history of multiple endocrine neoplasia type 2, medullary thyroid cancer; personal history of pancreatitis or gallbladder disease. - Recommend to check glucose with Freestyle Libre 3  - Meets financial criteria for [employer] patient assistance program through [employer]. Will collaborate with provider, CPhT, and patient to pursue assistance.    Hyperlipidemia/ASCVD Risk Reduction: - Currently uncontrolled.  - Reviewed long term complications of uncontrolled cholesterol - Reviewed dietary recommendations including limiting beef + bacon consumption and removing use of butter/oil in cooking - Advised if TG continues to increase, may have to consider Repatha in the future   Follow Up Plan:  - Follow-up on 03/07/23 at 11 AM to check Libreview online +  follow-up on Ozempic application - Send Accu-check Guide with strips for coverage (67 years old) - Send Ozempic PAP application to Dr. Constance Goltz to sign - MODIFY Lantus to 20 units daily starting on 03/02/23- will continue 2 unit increase every 2 days until we meet again next Tuesday for faster titration   Marlowe Aschoff, PharmD [facility] Phone Number: [phone]

## 2023-03-06 MED ORDER — ACCU-CHEK GUIDE W/DEVICE KIT: PACK | 0 refills | Status: DC

## 2023-03-06 MED ORDER — ACCU-CHEK GUIDE TEST VI STRP: ORAL_STRIP | 12 refills | Status: AC

## 2023-03-06 NOTE — Progress Notes (Signed)
I checked again, it should be in there now.  Looks like it was uploaded Friday

## 2023-03-07 ENCOUNTER — Other Ambulatory Visit: Payer: Self-pay | Admitting: Pharmacist

## 2023-03-07 NOTE — Progress Notes (Signed)
03/07/2023 Name: ORLANDER BIRDWELL MRN: [id number] DOB: 27-Jan-1956  No chief complaint on file.   Mario Arnold is a 67 y.o. year old male who presented for a telephone visit.   They were referred to the pharmacist by their PCP for assistance in managing diabetes.    Subjective:  Care Team: Primary Care Provider: Sandre Kitty, MD ; Next Scheduled Visit: 05/22/23 Clinical Pharmacist: Marlowe Aschoff, PharmD  Medication Access/Adherence  Current Pharmacy:  [facility] [address] ([address]), [place] - [address] [place] ([place]) [place] [address] Phone: [phone] Fax: [phone]   Patient reports affordability concerns with their medications: Yes - Ozempic Patient reports access/transportation concerns to their pharmacy: No  Patient reports adherence concerns with their medications:  No     Diabetes:  Current medications: Glipizide 10mg  twice a day, Lantus 22 U in AM, Metformin 500mg  4 tablets per day (max dose) Medications tried in the past: Trulicity 0.75mg  (backorder issues), Actos 45mg , Ozempic 1mg  (donut hole), Glyburide 10mg  twice a day (switch to Glipizide)  Report from 03/07/23:    Freestyle Libre 3- uses Libreview app from 12/4 visit: % Time CGM is active: 12%- newly placed on 02/27/23 in PM Average Glucose: 323 mg/dL Glucose Management Indicator: N/A  Glucose Variability: 14.1% (goal <36%) Time in Goal:  - Very high: 90% - High: 10% - In target: 0% - Below: 0%  Patient denies hypoglycemic s/sx including dizziness, shakiness, sweating. Patient denies hyperglycemic symptoms including polyuria, polydipsia, polyphagia, nocturia, neuropathy, blurred vision.  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)  Current medication access support: [facility] Advantage  Hyperlipidemia/ASCVD Risk Reduction  Current lipid lowering medications: Atorvastatin 80mg , Zetia 10mg , Fenofibrate 160mg  Medications tried in the past: None  Antiplatelet regimen: Aspirin 81 mg  ASCVD History: TIA in 2013, CABG x3 in 2023 Family History: Mother MI + TIA Risk Factors: Diet, lifestyle   The ASCVD Risk score (Arnett DK, et al., 2019) failed to calculate for the following reasons:   The patient has a prior MI or stroke diagnosis    Objective:  Lab Results  Component Value Date   HGBA1C 9.1 11/15/2022    Lab Results  Component Value Date   CREATININE 1.38 (H) 05/03/2022   BUN 20 05/03/2022   NA 134 05/03/2022   K 5.2 05/03/2022   CL 100 05/03/2022   CO2 20 05/03/2022    Lab Results  Component Value Date   CHOL 163 05/03/2022   HDL 24 (L) 05/03/2022   LDLCALC 58 05/03/2022   LDLDIRECT 55.5 05/07/2013   TRIG 538 (H) 05/03/2022   CHOLHDL 6.8 (H) 05/03/2022    Medications Reviewed Today   Medications were not reviewed in this encounter       Assessment/Plan:   Diabetes: - Currently uncontrolled - Reviewed long term cardiovascular and renal outcomes of uncontrolled blood sugar - Reviewed goal A1c, goal fasting, and goal 2 hour post prandial glucose - Reviewed dietary modifications  including limiting amount of toast and other carb-based foods - Patient denies personal or family history of multiple endocrine neoplasia type 2, medullary thyroid cancer; personal history of pancreatitis or gallbladder disease. - Recommend to check glucose with Freestyle Libre 3  - Meets financial criteria for [employer] patient assistance  program through [employer]. Will collaborate with provider, CPhT, and patient to pursue assistance.    Hyperlipidemia/ASCVD Risk Reduction: - Currently uncontrolled.  - Reviewed long term complications of uncontrolled cholesterol - Reviewed dietary recommendations including limiting beef + bacon consumption and removing use of butter/oil in cooking - Advised if TG continues to increase, may have to consider Repatha in the future   Follow Up Plan:  - Follow-up on 04/06/22 for Libreview check + Ozempic started yet- will need visit scheduled for refill form for max dose once titration complete based on how original application was sent - Briefly reviewed how Ozempic shipments will arrive to the office if approved - MODIFY Lantus to 24 U each morning  - Of note, readings have only been in goal range for the past 3 days based on insulin titration so I expect the GMI to lower further - Reviewed how each DM medication works- goal of removing Glipizide in the future  Marlowe Aschoff, PharmD [facility] Phone Number: [phone]

## 2023-03-15 ENCOUNTER — Other Ambulatory Visit: Payer: Self-pay | Admitting: Family Medicine

## 2023-03-15 DIAGNOSIS — E119 Type 2 diabetes mellitus without complications: Secondary | ICD-10-CM

## 2023-04-02 ENCOUNTER — Other Ambulatory Visit: Payer: Self-pay | Admitting: Family Medicine

## 2023-04-02 DIAGNOSIS — E1169 Type 2 diabetes mellitus with other specified complication: Secondary | ICD-10-CM

## 2023-04-07 ENCOUNTER — Other Ambulatory Visit: Payer: Self-pay | Admitting: Pharmacist

## 2023-04-07 DIAGNOSIS — E119 Type 2 diabetes mellitus without complications: Secondary | ICD-10-CM

## 2023-04-07 MED ORDER — INSULIN GLARGINE 100 UNIT/ML SOLOSTAR PEN: PEN_INJECTOR | SUBCUTANEOUS | 5 refills | Status: DC

## 2023-04-07 NOTE — Progress Notes (Addendum)
 04/07/2023 Name: Mario Arnold MRN: [id number] DOB: 05/13/1955  Chief Complaint  Patient presents with   Diabetes    Mario Arnold is a 68 y.o. year old male who presented for a telephone visit.   They were referred to the pharmacist by their PCP for assistance in managing diabetes.    Subjective:  Care Team: Primary Care Provider: Chandra Toribio POUR, MD ; Next Scheduled Visit: 05/22/23 Clinical Pharmacist: Aloysius Breeding, PharmD  Medication Access/Adherence  Current Pharmacy:  [facility] [address] ([address]), [place] - [address] [place] ([place]) [place] [address] Phone: [phone] Fax: [phone]   Patient reports affordability concerns with their medications: Yes - Ozempic  Patient reports access/transportation concerns to their pharmacy: No  Patient reports adherence concerns with their medications:  No     Diabetes:  Current medications: Glipizide  10mg  twice a day, Lantus  24 U in AM, Metformin  500mg  4 tablets per day (max dose) Medications tried in the past: Trulicity  0.75mg  (backorder issues), Actos  45mg , Ozempic  1mg  (donut hole), Glyburide  10mg  twice a day (switch to Glipizide )       Freestyle Libre 3- uses Libreview app from 12/4 visit: % Time CGM is active: 12%- newly placed on 02/27/23 in PM Average Glucose: 323 mg/dL Glucose Management Indicator: N/A  Glucose Variability: 14.1% (goal <36%) Time in Goal:  - Very high: 90% - High: 10% - In target: 0% - Below: 0%  Patient denies hypoglycemic s/sx including dizziness, shakiness, sweating. Patient denies hyperglycemic symptoms including polyuria, polydipsia, polyphagia, nocturia, neuropathy, blurred vision.  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)  Current medication access support: [facility] Advantage  Hyperlipidemia/ASCVD Risk Reduction  Current lipid lowering medications: Atorvastatin  80mg , Zetia  10mg , Fenofibrate  160mg  Medications tried in the past: None  Antiplatelet regimen: Aspirin  81 mg  ASCVD History: TIA in 2013, CABG x3 in 2023 Family History: Mother MI + TIA Risk Factors: Diet, lifestyle   The ASCVD Risk score (Arnett DK, et al., 2019) failed to calculate for the following reasons:   Risk score cannot be calculated because patient has a medical history suggesting prior/existing ASCVD    Objective:  Lab Results  Component Value Date   HGBA1C 9.1 11/15/2022    Lab Results  Component Value Date   CREATININE 1.38 (H) 05/03/2022   BUN 20 05/03/2022   NA 134 05/03/2022   K 5.2 05/03/2022   CL 100 05/03/2022   CO2 20 05/03/2022    Lab Results  Component Value Date   CHOL 163 05/03/2022   HDL 24 (L) 05/03/2022   LDLCALC 58 05/03/2022   LDLDIRECT 55.5 05/07/2013   TRIG 538 (H) 05/03/2022   CHOLHDL 6.8 (H) 05/03/2022    Medications Reviewed Today   Medications were not reviewed in this encounter       Assessment/Plan:   Diabetes: - Currently uncontrolled - Reviewed long term cardiovascular and renal outcomes of uncontrolled blood sugar - Reviewed goal A1c, goal fasting, and goal  2 hour post prandial glucose - Reviewed dietary modifications including limiting amount of toast and other carb-based foods - Patient denies personal or family history of multiple endocrine neoplasia type 2, medullary thyroid  cancer; personal history of pancreatitis or gallbladder disease. - Recommend to check glucose with Freestyle  Libre 3  - Meets financial criteria for Ozempic  patient assistance program through [employer]. Will collaborate with provider, CPhT, and patient to pursue assistance.    Hyperlipidemia/ASCVD Risk Reduction: - Currently uncontrolled.  - Reviewed long term complications of uncontrolled cholesterol - Reviewed dietary recommendations including limiting beef + bacon consumption and removing use of butter/oil in cooking - Advised if TG continues to increase, may have to consider Repatha in the future   Follow Up Plan:  - Follow-up on 05/04/22 for Libreview check + Ozempic  started yet - Will need visit scheduled for refill form for max dose once titration complete based on how original application was sent - Briefly reviewed how Ozempic  shipments will arrive to the office if approved- should arrive 1/14-1/20   - Advised to call office if shipment has NOT arrived by 04/18/23 - MODIFY Lantus  to 26 U each morning - Reviewed how each DM medication works- goal of removing Glipizide  in the future  Aloysius Breeding, PharmD [facility] Phone Number: [phone]

## 2023-04-07 NOTE — Addendum Note (Signed)
 Addended by: Pollie Friar on: 04/07/2023 11:35 AM   Modules accepted: Orders

## 2023-04-10 ENCOUNTER — Other Ambulatory Visit: Payer: Self-pay | Admitting: Family Medicine

## 2023-04-14 ENCOUNTER — Other Ambulatory Visit: Payer: Self-pay | Admitting: Family Medicine

## 2023-04-14 DIAGNOSIS — E785 Hyperlipidemia, unspecified: Secondary | ICD-10-CM

## 2023-04-18 ENCOUNTER — Other Ambulatory Visit: Payer: Self-pay | Admitting: Family Medicine

## 2023-04-18 DIAGNOSIS — E119 Type 2 diabetes mellitus without complications: Secondary | ICD-10-CM

## 2023-04-24 ENCOUNTER — Telehealth: Payer: Self-pay

## 2023-04-24 ENCOUNTER — Other Ambulatory Visit: Payer: Self-pay | Admitting: Family Medicine

## 2023-04-24 MED ORDER — FREESTYLE LIBRE 3 SENSOR MISC: 3 refills | Status: DC

## 2023-04-24 MED ORDER — LOSARTAN POTASSIUM 25 MG PO TABS: 25.0000 mg | ORAL_TABLET | Freq: Every day | ORAL | 1 refills | Status: DC

## 2023-04-24 NOTE — Telephone Encounter (Signed)
This CRM came to [facility] by mistake. Thank you.  Copied from CRM [id number]. Topic: Clinical - Medication Question >> Apr 24, 2023  1:16 PM Suzette B wrote: Reason for CRM: patient is calling to check the status of his Ozempic  he states the medication was being supplying the ozempic [id number]

## 2023-04-24 NOTE — Telephone Encounter (Signed)
Copied from CRM [id number]. Topic: Clinical - Medication Refill >> Apr 24, 2023  1:18 PM Hector Shade B wrote: Most Recent Primary Care Visit:  Provider: Sandre Kitty  Department: [facility]  Visit Type: OFFICE VISIT 20  Date: 02/17/2023  Medication:  losartan (COZAAR) 25 MG tablet  Continuous Glucose Sensor (FREESTYLE LIBRE 3 SENSOR) MISC    Has the patient contacted their pharmacy?  (Agent: If no, request that the patient contact the pharmacy for the refill. If patient does not wish to contact the pharmacy document the reason why and proceed with request.) (Agent: If yes, when and what did the pharmacy advise?)Yes and patient needed to call in to request the clinic to submit the refill   Is this the correct pharmacy for this prescription? Yes If no, delete pharmacy and type the correct one.  This is the patient's preferred pharmacy:  [facility] [address] ([address]), [place] - [address] [place] ([place]) [place] [address] Phone: [phone] Fax: [phone]   Has the prescription been filled recently? Yes  Is the patient out of the medication? Yes  Has the patient been seen for an appointment in the last year OR does the patient have an upcoming appointment? Yes  Can we respond through MyChart? Yes  Agent: Please be advised that Rx refills may take up to 3 business days. We ask that you follow-up with your pharmacy.

## 2023-04-25 NOTE — Telephone Encounter (Signed)
Hello,  We have not received any supplies of Ozempic for this patient.  Was it supposed to be sent to our clinic?  Thank you,  Dr. Constance Goltz

## 2023-04-26 ENCOUNTER — Encounter: Payer: Self-pay | Admitting: Family Medicine

## 2023-04-28 ENCOUNTER — Telehealth: Payer: Self-pay | Admitting: Pharmacist

## 2023-04-28 DIAGNOSIS — E119 Type 2 diabetes mellitus without complications: Secondary | ICD-10-CM

## 2023-04-28 MED ORDER — OZEMPIC (0.25 OR 0.5 MG/DOSE) 2 MG/3ML ~~LOC~~ SOPN: PEN_INJECTOR | SUBCUTANEOUS | 0 refills | Status: DC

## 2023-04-28 NOTE — Addendum Note (Signed)
Addended by: Pollie Friar on: 04/28/2023 02:02 PM   Modules accepted: Orders

## 2023-04-28 NOTE — Progress Notes (Signed)
   04/28/2023  Patient ID: Mario Arnold, male   DOB: December 01, 1955, 68 y.o.   MRN: [id number]  Called and spoke with the patient on the phone today. I was able to get a voucher for Ozempic 0.25mg /0.5mg  dose to get him started with the titration while the rest of the order shipments. Confirmed I will send a script for 1 box of Ozempic to [facility]. He just has to show the voucher card to get cost to $0 for now.   Confirmed understanding and voucher information sent through [employer].   Marlowe Aschoff, PharmD [facility] Phone Number: [phone]

## 2023-05-02 ENCOUNTER — Telehealth: Payer: Self-pay | Admitting: *Deleted

## 2023-05-02 ENCOUNTER — Other Ambulatory Visit: Payer: Self-pay | Admitting: Family Medicine

## 2023-05-02 DIAGNOSIS — E119 Type 2 diabetes mellitus without complications: Secondary | ICD-10-CM

## 2023-05-02 MED ORDER — INSULIN GLARGINE 100 UNIT/ML SOLOSTAR PEN: PEN_INJECTOR | SUBCUTANEOUS | 5 refills | Status: DC

## 2023-05-02 NOTE — Telephone Encounter (Signed)
It looks like the previous prescription did say 26 units.  Either way I sent a new prescription back in that says 26 units

## 2023-05-02 NOTE — Telephone Encounter (Signed)
 Copied from CRM [id number]. Topic: Clinical - Prescription Issue >> May 02, 2023  9:39 AM Powell HERO wrote: Reason for CRM: insulin  glargine (LANTUS ) 100 UNIT/ML Solostar Pen, patient states that his prescription is only for 16 units. He needs a refill thats labeled 26 units so that is lasts as long as he needs.

## 2023-05-04 NOTE — Telephone Encounter (Unsigned)
 Copied from CRM [id number]. Topic: General - Other >> May 04, 2023  3:28 PM Lynnie Saucier S wrote: Reason for CRM: Patient needs PCP to call and advise diagnosis of diabetes and heart condition. Please call [phone] with information.

## 2023-05-04 NOTE — Telephone Encounter (Signed)
I called the number provided

## 2023-05-05 ENCOUNTER — Other Ambulatory Visit: Payer: Self-pay | Admitting: Pharmacist

## 2023-05-05 NOTE — Progress Notes (Signed)
 05/05/2023 Name: Mario Arnold MRN: [id number] DOB: [birth year]  Chief Complaint  Patient presents with   Diabetes    Mario Arnold is a 68 y.o. year old male who presented for a telephone visit.   They were referred to the pharmacist by their PCP for assistance in managing diabetes.    Subjective:  Care Team: Primary Care Provider: Chandra Toribio POUR, MD ; Next Scheduled Visit: 05/22/23 Clinical Pharmacist: Aloysius Breeding, PharmD  Medication Access/Adherence  Current Pharmacy:  [facility] [address] ([address]), [place] - [address] [place] ([place]) [place] [address] Phone: [phone] Fax: [phone]   Patient reports affordability concerns with their medications: Yes - Ozempic  Patient reports access/transportation concerns to their pharmacy: No  Patient reports adherence concerns with their medications:  No     Diabetes:  Current medications: Glipizide  10mg  twice a day, Lantus  26 U in AM, Metformin  500mg  4 tablets per day (max dose), Ozempic  0.25mg  weekly- injects Tuesdays Medications tried in the past: Trulicity  0.75mg  (backorder issues), Actos  45mg , Ozempic  1mg  (donut hole), Glyburide  10mg  twice a day (switch to Glipizide )        Freestyle Libre 3- uses Libreview app from 12/4 visit: % Time CGM is active: 12%- newly placed on 02/27/23 in PM Average Glucose: 323 mg/dL Glucose Management Indicator: N/A  Glucose Variability: 14.1% (goal <36%) Time in Goal:  - Very high: 90% - High: 10% - In target: 0% - Below: 0%  Patient denies hypoglycemic s/sx including dizziness, shakiness, sweating. Patient denies hyperglycemic symptoms including polyuria, polydipsia, polyphagia, nocturia, neuropathy, blurred vision.  Current meal patterns:  - Breakfast: Grits + eggs, bacon + egg sandwich - Lunch: Pepsi/diet coke + crackers - Supper: Pork chops, vegetables, hamburger helper, collared greens, meatloaf - Snacks: Ramen pack 1-2 times per day, meat  leftovers, snacks EVERY night (chicken salad) - Drinks: Diet Pepsi or Mt Dew; 1 glass of water per day  *Reports loving food, so he would like to work around it with medications  Current physical activity: Back pain (had to retire early from work- pharmacist, community); only walks at [employer] (was walking during 12/10 call)  Current medication access support: [facility]  Hyperlipidemia/ASCVD Risk Reduction  Current lipid lowering medications: Atorvastatin  80mg , Zetia  10mg , Fenofibrate  160mg  Medications tried in the past: None  Antiplatelet regimen: Aspirin  81 mg  ASCVD History: TIA in 2013, CABG x3 in 2023 Family History: Mother MI + TIA Risk Factors: Diet, lifestyle   The ASCVD Risk score (Arnett DK, et al., 2019) failed to calculate for the following reasons:   Risk score cannot be calculated because patient has a medical history suggesting prior/existing ASCVD    Objective:  Lab Results  Component Value Date   HGBA1C 9.1 11/15/2022    Lab Results  Component Value Date   CREATININE 1.38 (H) 05/03/2022   BUN 20 05/03/2022   NA 134 05/03/2022   K 5.2 05/03/2022   CL 100 05/03/2022   CO2 20 05/03/2022    Lab Results  Component Value Date   CHOL 163 05/03/2022   HDL 24 (L) 05/03/2022   LDLCALC 58 05/03/2022   LDLDIRECT 55.5 05/07/2013   TRIG 538 (H) 05/03/2022   CHOLHDL 6.8 (H) 05/03/2022    Medications Reviewed Today   Medications were not reviewed in this encounter       Assessment/Plan:   Diabetes: - Currently uncontrolled - Reviewed long term cardiovascular and renal outcomes of uncontrolled blood sugar - Reviewed  goal A1c, goal fasting, and goal 2 hour post prandial glucose - Reviewed dietary modifications including limiting amount of toast and other carb-based foods - Recommend to check glucose with Freestyle Libre 3    Hyperlipidemia/ASCVD Risk Reduction: - Currently uncontrolled.  - Reviewed long term complications of uncontrolled  cholesterol - Reviewed dietary recommendations including limiting beef + bacon consumption and removing use of butter/oil in cooking - Advised if TG continues to increase, may have to consider Repatha in the future   Follow Up Plan:  - Follow-up on 05/30/23 for Libreview check + Ozempic  titration (will be going up to 0.5mg  weekly this day) + ensuring Ozempic  shipment arrived at office - Started Ozempic  0.25mg  weekly on 05/02/23 - Will need visit scheduled for refill form for max dose once titration complete based on how original application was sent - MODIFY Lantus  to 28 U each morning (max of 30 U on script) - Reviewed how each DM medication works- goal of removing Glipizide  in the future  *Of note, reports having drug deductible for old plan but [facility] SNP plan will start at beginning of March- verified with insurance yesterday for 2 hours  Aloysius Breeding, PharmD [facility] Phone Number: [phone]

## 2023-05-09 ENCOUNTER — Encounter: Payer: Self-pay | Admitting: Cardiology

## 2023-05-09 ENCOUNTER — Ambulatory Visit: Payer: Medicare Other | Attending: Cardiology | Admitting: Cardiology

## 2023-05-09 VITALS — BP 134/77 | HR 81 | Ht 67.0 in | Wt 181.6 lb

## 2023-05-09 DIAGNOSIS — E785 Hyperlipidemia, unspecified: Secondary | ICD-10-CM

## 2023-05-09 DIAGNOSIS — I10 Essential (primary) hypertension: Secondary | ICD-10-CM | POA: Diagnosis not present

## 2023-05-09 DIAGNOSIS — I25708 Atherosclerosis of coronary artery bypass graft(s), unspecified, with other forms of angina pectoris: Secondary | ICD-10-CM | POA: Diagnosis not present

## 2023-05-09 NOTE — Patient Instructions (Signed)
Medication Instructions:  Continue same medications *If you need a refill on your cardiac medications before your next appointment, please call your pharmacy*   Lab Work: None ordered   Testing/Procedures: None ordered   Follow-Up: At [facility], you and your health needs are our priority.  As part of our continuing mission to provide you with exceptional heart care, we have created designated Provider Care Teams.  These Care Teams include your primary Cardiologist (physician) and Advanced Practice Providers (APPs -  Physician Assistants and Nurse Practitioners) who all work together to provide you with the care you need, when you need it.  We recommend signing up for the patient portal called "MyChart".  Sign up information is provided on this After Visit Summary.  MyChart is used to connect with patients for Virtual Visits (Telemedicine).  Patients are able to view lab/test results, encounter notes, upcoming appointments, etc.  Non-urgent messages can be sent to your provider as well.   To learn more about what you can do with MyChart, go to [email or web address].    Your next appointment:  1 year   Call in Oct to schedule Feb appointment  ( New Office )    Provider:  Dr.Jordan

## 2023-05-09 NOTE — Progress Notes (Signed)
Mario Arnold Date of Birth: [birth year] Medical Record #[id number]  History of Present Illness: Mario Arnold is seen for follow up CAD.  Has known CAD with  remote PCI of the LAD in 2003. Last cath in 2013 following abnormal Myoview showed moderate 2 vessel disease with a 50% in stent stenosis of the proximal LAD and a 70% lesion in an intermediate branch. EF of 50% - managed medically. Other issues include  thyroid nodule with normal TSH, prior TIA in 2013, DM, HTN and HLD.   He was seen in June 2018 with symptoms of increased dyspnea on exertion. Myoview was repeated and felt to be low risk. Last seen in Feb 2019. Carotid dopplers in June 2020 showed 40-59% right ICA stenosis. Followed by Dr Myra Gianotti. States he had Covid 19 back in October.   He was seen in June 2023 for pre op assessment for extensive lumbar surgery. Had some anginal symptoms and Myoview was abnormal. Underwent cardiac cath showing 3 vessel obstructive CAD. EF 45-50%. He underwent CABG by Dr Cliffton Asters on 10/12/21 with LIMA to the LAD, radial graft to IM (Y graft off SVG) and SVG to PDA. Post op course was unremarkable. Carotid and LE arterial dopplers were OK.   On follow up today he is doing well from a CV standpoint. Denies any chest pain. Activity is still quite limited due to his spinal issues. Followed by Dr Christell Constant with ortho. Now on insulin and Ozempic. Follow up with PCP this week.   Current Outpatient Medications  Medication Sig Dispense Refill   acetaminophen (TYLENOL) 500 MG tablet Take 1,000 mg by mouth every 8 (eight) hours as needed for mild pain.     aspirin EC 81 MG tablet Take 1 tablet (81 mg total) by mouth daily. Swallow whole. 90 tablet 3   atorvastatin (LIPITOR) 80 MG tablet Take 1 tablet by mouth once daily 90 tablet 1   blood glucose meter kit and supplies Dispense based on patient and insurance preference. Use up to four times daily as directed. (FOR ICD-10 E10.9, E11.9). 1 each 0   Blood Glucose Monitoring Suppl  (ACCU-CHEK GUIDE) w/Device KIT USE AS DIRECTED TO CHECK BLOOD SUGAR 1 kit 0   Continuous Glucose Sensor (FREESTYLE LIBRE 3 SENSOR) MISC Place 1 sensor on the skin every 14 days. Use to check glucose continuously 2 each 3   ezetimibe (ZETIA) 10 MG tablet TAKE 1 TABLET BY MOUTH ONCE DAILY IN THE EVENING 60 tablet 0   fenofibrate 160 MG tablet Take 1 tablet by mouth once daily 90 tablet 1   glipiZIDE (GLUCOTROL) 10 MG tablet TAKE 1 TABLET BY MOUTH TWICE DAILY BEFORE A MEAL 60 tablet 2   glucose blood (ACCU-CHEK GUIDE TEST) test strip Use to check BG 4 times a day as directed. Dx E11.9- insulin use 200 each 12   insulin glargine (LANTUS) 100 UNIT/ML Solostar Pen Inject 26 units into the skin daily. Titrate as directed by PCP. Max dose of 30 units daily. (Patient taking differently: Inject 28 units into the skin daily. Titrate as directed by PCP. Max dose of 30 units daily.) 15 mL 5   Insulin Pen Needle (PEN NEEDLES) 31G X 5 MM MISC Use with injection of Solostar insulin pen. 90 each 3   losartan (COZAAR) 25 MG tablet Take 1 tablet (25 mg total) by mouth daily. 90 tablet 1   metFORMIN (GLUCOPHAGE) 500 MG tablet TAKE 2 TABLETS BY MOUTH IN THE MORNING AND 2 IN THE EVENING 360  tablet 3   metoprolol tartrate (LOPRESSOR) 25 MG tablet Take 1 tablet by mouth twice daily 180 tablet 3   Semaglutide,0.25 or 0.5MG /DOS, (OZEMPIC, 0.25 OR 0.5 MG/DOSE,) 2 MG/3ML SOPN Inject 0.25mg  weekly into the skin for 4 weeks, then increase to 0.5mg  weekly. 3 mL 0   No current facility-administered medications for this visit.    Allergies  Allergen Reactions   Nsaids Other (See Comments)    Aleve* bad on Kidneys    Dilaudid [Hydromorphone] Nausea And Vomiting    Past Medical History:  Diagnosis Date   Arthritis    Carotid disease, bilateral (HCC) 08/2011   less than 50% stenosis in the right and left    Chronic kidney disease 2017   stones   Coronary artery disease    STATUS POST ANGIOPLASTY OF THE LAD    Diabetes  mellitus    TYPE 2   Dyslipidemia    History of kidney stones    History of transient ischemic attack (TIA)    on aggrenox   Hyperlipidemia    Myocardial infarction [facility]) 1999   stent placement- per patient has 2 stents   Stroke [facility])    Thyroid nodule 08/2011   had [place] with 4 mm small cystic nodule noted. Needs follow up US in 12 months. TSH is normal.     Past Surgical History:  Procedure Laterality Date   COLONOSCOPY     13 years ago in [facility] normal exam per pt   CORONARY ANGIOPLASTY WITH STENT PLACEMENT  03/28/2001   LAD. EJECTION FRACTION IS 45%   CORONARY ARTERY BYPASS GRAFT N/A 10/12/2021   Procedure: CORONARY ARTERY BYPASS GRAFTING (CABG) x3 USING LEFT INTERNAL MAMMARY ARTERY AND LEFT RADIAL ARM GRAFT;  Surgeon: Corliss Skains, MD;  Location: [facility] OR;  Service: Open Heart Surgery;  Laterality: N/A;   CORONARY PRESSURE/FFR STUDY N/A 09/24/2021   Procedure: INTRAVASCULAR PRESSURE WIRE/FFR STUDY;  Surgeon: [place], Olive Motyka M, MD;  Location: [facility] INVASIVE CV LAB;  Service: Cardiovascular;  Laterality: N/A;   LEFT HEART CATH AND CORONARY ANGIOGRAPHY N/A 09/24/2021   Procedure: LEFT HEART CATH AND CORONARY ANGIOGRAPHY;  Surgeon: [place], Keiden Deskin M, MD;  Location: [facility] INVASIVE CV LAB;  Service: Cardiovascular;  Laterality: N/A;   RADIAL ARTERY HARVEST Left 10/12/2021   Procedure: RADIAL ARTERY HARVEST;  Surgeon: Corliss Skains, MD;  Location: [facility] OR;  Service: Open Heart Surgery;  Laterality: Left;   SHOULDER ARTHROSCOPY WITH OPEN ROTATOR CUFF REPAIR AND DISTAL CLAVICLE ACROMINECTOMY Right 09/05/2012   Procedure: RIGHT SHOULDER ARTHROSCOPY WITH LABRAL DEBRIDEMENT AND OPEN DISTAL CLAVICLE RESECTION, acromionectomy  AND OPEN ROTATOR CUFF REPAIR;  Surgeon: Drucilla Schmidt, MD;  Location: [facility] ORS;  Service: Orthopedics;  Laterality: Right;   TEE WITHOUT CARDIOVERSION N/A 10/12/2021   Procedure: TRANSESOPHAGEAL ECHOCARDIOGRAM (TEE);  Surgeon: Corliss Skains, MD;  Location: [facility] OR;   Service: Open Heart Surgery;  Laterality: N/A;    Social History   Tobacco Use  Smoking Status Former   Current packs/day: 0.00   Average packs/day: 1.5 packs/day for 23.0 years (34.5 ttl pk-yrs)   Types: Cigarettes   Start date: 03/28/1974   Quit date: 03/28/1997   Years since quitting: 26.1  Smokeless Tobacco Former   Types: Snuff, Chew  Tobacco Comments   as a teen    Social History   Substance and Sexual Activity  Alcohol Use Yes   Alcohol/week: 2.0 standard drinks of alcohol   Types: 2 Cans of beer per week  Comment: socially    Family History  Problem Relation Age of Onset   Coronary artery disease Mother    Heart attack Mother    Diabetes Mother    Transient ischemic attack Mother    Heart disease Mother    Cancer Father    Colon cancer Neg Hx    Esophageal cancer Neg Hx    Colon polyps Neg Hx    Rectal cancer Neg Hx    Stomach cancer Neg Hx     Review of Systems: The review of systems is per the HPI.  All other systems were reviewed and are negative.  Physical Exam: BP 134/77   Pulse 81   Ht 5\' 7"  (1.702 m)   Wt 181 lb 9.6 oz (82.4 kg)   SpO2 93%   BMI 28.44 kg/m  GENERAL:  Well appearing WM in NAD HEENT:  PERRL, EOMI, sclera are clear. Oropharynx is clear. NECK:  No jugular venous distention, carotid upstroke brisk and symmetric, no bruits, no thyromegaly or adenopathy LUNGS:  Clear to auscultation bilaterally CHEST:  Unremarkable HEART:  RRR,  PMI not displaced or sustained,S1 and S2 within normal limits, no S3, no S4: no clicks, no rubs, no murmurs ABD:  Soft, nontender. BS +, no masses or bruits. No hepatomegaly, no splenomegaly EXT:  2 + pulses throughout, no edema, no cyanosis no clubbing SKIN:  Warm and dry.  No rashes NEURO:  Alert and oriented x 3. Cranial nerves II through XII intact. PSYCH:  Cognitively intact    LABORATORY DATA:    Lab Results  Component Value Date   WBC 4.8 05/03/2022   HGB 15.3 05/03/2022   HCT 46.3  05/03/2022   PLT 168 05/03/2022   GLUCOSE 307 (H) 05/03/2022   CHOL 163 05/03/2022   TRIG 538 (H) 05/03/2022   HDL 24 (L) 05/03/2022   LDLDIRECT 55.5 05/07/2013   LDLCALC 58 05/03/2022   ALT 47 (H) 05/03/2022   AST 35 05/03/2022   NA 134 05/03/2022   K 5.2 05/03/2022   CL 100 05/03/2022   CREATININE 1.38 (H) 05/03/2022   BUN 20 05/03/2022   CO2 20 05/03/2022   TSH 3.730 05/03/2022   INR 1.3 (H) 10/12/2021   HGBA1C 9.1 11/15/2022   MICROALBUR 30 08/15/2022   Carotid dopplers June 2020: 40-59% RICA stenosis.   Labs reviewed from primary care on 12/04/15: cholesterol 139, triglycerides 628, LDL 40, HDL 24. A1c 12. Glucose 250. BUN 19, creatinine 1.23. Other chemistries and CBC normal.  Dated 09/23/16: glucose 237, creatinine 1.3. A1c 10.4. Cholesterol 110, triglycerides 124. HDL 27, LDL 59.  Dated 04/21/17: A1c 7.5. Creatinine 1.39. Other chemistries normal. Dated 05/29/19: cholesterol 133, triglycerides 190, HDL 29, LDL 72. A1c 7.7%. creatinine 1.31. glucose 500. Bicarb 18. Other chemistries normal. CBC and TSH normal.  Dated 08/29/19: A1c 9%.  Dated 01/31/22: A1c 6.1%.   EKG Interpretation Date/Time:  Tuesday May 09 2023 15:00:43 EST Ventricular Rate:  81 PR Interval:  152 QRS Duration:  88 QT Interval:  374 QTC Calculation: 434 R Axis:   -29  Text Interpretation: Sinus rhythm with occasional Premature ventricular complexes Septal infarct (cited on or before 03-May-2001) Possible Lateral infarct (cited on or before 12-Oct-2021) When compared with ECG of November 01, 2021 Premature ventricular complexes are now Present Otherwise no significant change Confirmed by [place], Lekeith Wulf [phone]) on 05/09/2023 3:11:29 PM     Myoview 09/30/16: Study Highlights   Clincally negative, electrically positive for ischemia Excellent exercise tolerance Basal  inferior, inferolateral defect that normalizes in recovery period. May reflect soft tissue attenuation, cannot exclude very mild ischemia LVEF  50% Low to intermediate risk study    Myoview 09/10/21: Study Highlights      Findings are consistent with prior myocardial infarction with peri-infarct ischemia. The study is intermediate risk.   No ST deviation was noted.   LV perfusion is abnormal. There is evidence of ischemia. Defect 1: There is a medium defect with moderate reduction in uptake present in the apical to mid anterior, anteroseptal and inferoseptal location(s) that is partially reversible. There is abnormal wall motion in the defect area. Consistent with peri-infarct ischemia.   Left ventricular function is abnormal. Global function is mildly reduced. Nuclear stress EF: 50 %. The left ventricular ejection fraction is mildly decreased (45-54%). End diastolic cavity size is mildly enlarged. End systolic cavity size is normal.   Prior study available for comparison from 09/30/2016. There are changes compared to prior study.   Mild apical defect at rest, with mild hypokinesis at the apex, consistent with prior infarct. At stress, mild-moderate defects in mid to apical anterior, anteroseptal, and inferoseptal walls consistent with ischemia.  Cardiac cath 09/24/21:  INTRAVASCULAR PRESSURE WIRE/FFR STUDY  LEFT HEART CATH AND CORONARY ANGIOGRAPHY   Conclusion      Prox RCA to Mid RCA lesion is 45% stenosed.   Dist RCA lesion is 85% stenosed.   RPDA lesion is 70% stenosed.   Dist Cx lesion is 60% stenosed.   Ramus lesion is 70% stenosed.   Prox LAD to Mid LAD lesion is 70% stenosed.   Mid LAD lesion is 60% stenosed.   LV end diastolic pressure is normal.   Severe 3 vessel obstructive CAD. Long segmental disease in the proximal to mid LAD with abnormal RFR, 70% ramus intermediate, 85% distal RCA and 70% PDA. Normal LVEDP   Given diabetes and diffuse disease would recommend referral for CABG. Coronary Diagrams  Diagnostic Dominance: Right  Inte   Echo 09/24/21: IMPRESSIONS     1. Apical, mid and basal inferior wall  hypokinesis . Left ventricular  ejection fraction, by estimation, is 45 to 50%. The left ventricle has  mildly decreased function. The left ventricle demonstrates regional wall  motion abnormalities (see scoring  diagram/findings for description). Left ventricular diastolic parameters  were normal.   2. Right ventricular systolic function is normal. The right ventricular  size is normal.   3. Left atrial size was mildly dilated.   4. The mitral valve is normal in structure. No evidence of mitral valve  regurgitation. No evidence of mitral stenosis.   5. The aortic valve is tricuspid. There is mild calcification of the  aortic valve. There is mild thickening of the aortic valve. Aortic valve  regurgitation is not visualized. Aortic valve sclerosis is present, with  no evidence of aortic valve stenosis.   6. The inferior vena cava is normal in size with greater than 50%  respiratory variability, suggesting right atrial pressure of 3 mmHg.    Assessment / Plan: 1. CAD - remote PCI of the LAD in 2003 Low risk Myoview in July 2018. Abnormal Myoview in June 2023 with symptoms. Repeat cath showed progressive 3 v CAD. Now s/p CABG on 10/12/21 x 3. He is doing well without angina. Continue ASA, metoprolol, statin. Focus on risk factor modification  2. HTN - BP looks good on metoprolol and losartan. Low sodium diet.   3. HLD - on Lipitor, Zetia, fish oil, and fenofibrate.  Plan repeat labs when he sees PCP this month. Goal LDL < 55.   4. DM type 2. Per PCP  5. Lumbar disc disease with radiculopathy. Followed by Dr Willia Craze with ortho. Considering surgery once sugars better.     Follow up in one year

## 2023-05-22 ENCOUNTER — Encounter: Payer: Self-pay | Admitting: Family Medicine

## 2023-05-22 ENCOUNTER — Ambulatory Visit (INDEPENDENT_AMBULATORY_CARE_PROVIDER_SITE_OTHER): Payer: Medicare Other | Admitting: Family Medicine

## 2023-05-22 ENCOUNTER — Other Ambulatory Visit: Payer: Self-pay | Admitting: Family Medicine

## 2023-05-22 VITALS — BP 111/73 | HR 67 | Ht 67.0 in | Wt 179.0 lb

## 2023-05-22 DIAGNOSIS — E119 Type 2 diabetes mellitus without complications: Secondary | ICD-10-CM

## 2023-05-22 DIAGNOSIS — E1169 Type 2 diabetes mellitus with other specified complication: Secondary | ICD-10-CM

## 2023-05-22 DIAGNOSIS — Z7984 Long term (current) use of oral hypoglycemic drugs: Secondary | ICD-10-CM

## 2023-05-22 DIAGNOSIS — E1159 Type 2 diabetes mellitus with other circulatory complications: Secondary | ICD-10-CM | POA: Diagnosis not present

## 2023-05-22 DIAGNOSIS — E785 Hyperlipidemia, unspecified: Secondary | ICD-10-CM

## 2023-05-22 DIAGNOSIS — E782 Mixed hyperlipidemia: Secondary | ICD-10-CM

## 2023-05-22 DIAGNOSIS — I152 Hypertension secondary to endocrine disorders: Secondary | ICD-10-CM

## 2023-05-22 DIAGNOSIS — I10 Essential (primary) hypertension: Secondary | ICD-10-CM

## 2023-05-22 DIAGNOSIS — M12812 Other specific arthropathies, not elsewhere classified, left shoulder: Secondary | ICD-10-CM

## 2023-05-22 DIAGNOSIS — M12819 Other specific arthropathies, not elsewhere classified, unspecified shoulder: Secondary | ICD-10-CM | POA: Insufficient documentation

## 2023-05-22 LAB — POCT GLYCOSYLATED HEMOGLOBIN (HGB A1C): HbA1c POC (<> result, manual entry): 8.3 % (ref 4.0–5.6)

## 2023-05-22 MED ORDER — EMPAGLIFLOZIN 10 MG PO TABS: 10.0000 mg | ORAL_TABLET | Freq: Every day | ORAL | 2 refills | Status: DC

## 2023-05-22 NOTE — Assessment & Plan Note (Signed)
 Has a history of right rotator cuff repair.  Now having pain with left rotator cuff.  Recommended topical heat and over-the-counter topical medications.  Provided patient with some home exercises he can do.  If pain persists would encourage him to discuss this with his orthopedist.

## 2023-05-22 NOTE — Patient Instructions (Addendum)
 It was nice to see you today,  We addressed the following topics today: -We do not have your Ozempic here.  You should reach out to the manufacturer at the following number:[phone].  I will also send a message to the pharmacist that has been working on this to see if there is anything else to do. --I sent in Jardiance but if this is too expensive you do not have to pick it up.  Just let us know.  Also let us know if you run out of your Ozempic before you are able to get a new shipment - Try the exercises I have given you at home for your shoulder.  Have a great day,  Frederic Jericho, MD

## 2023-05-22 NOTE — Assessment & Plan Note (Signed)
 Patient A1c 8.2 today.  Has been on Ozempic again only for 2 weeks at 0.5 mg.  Still having issues with shipping from his manufacturer.  We have not received his Ozempic yet.  He also had issues with his insurance regarding the Lantus in which she had to pay extra for the name brand instead of the generic for some reason even though the generic was prescribed. - Continue current medications: Metformin, glipizide, Lantus 28 units every morning - Adding Jardiance but if it is not affordable advised patient just to let us know. - His most recent 2-week report from his freestyle showed an estimate of his A1c for that time.  To be 7%.  If we are ever able to get his Ozempic he could likely be controlled with just the addition of this. - UACR today.

## 2023-05-22 NOTE — Progress Notes (Unsigned)
 Established Patient Office Visit  Subjective   Patient ID: Mario Arnold, male    DOB: [birth year]  Age: 68 y.o. MRN: [id number]  Chief Complaint  Patient presents with   Medical Management of Chronic Issues    HPI  DM2-patient is currently taking 28 units of Lantus in the morning.  Still has not received his shipment of Ozempic but was able to get a coupon for free 1 month supply.  Has used Ozempic twice now at 0.5 mg.  Taking his other diabetes medications.  We discussed his A1c and adding additional medication while we await his Ozempic.  He is hesitant to start but would take the Jardiance if it was affordable.  Patient has pain in his left shoulder.  Has had is right shoulder operated on in the past for rotator cuff issues.  Believes this to be a rotator cuff issue.  Patient pain is mostly if he reaches out, behind his back or up over his head.   The ASCVD Risk score (Arnett DK, et al., 2019) failed to calculate for the following reasons:   Risk score cannot be calculated because patient has a medical history suggesting prior/existing ASCVD  Health Maintenance Due  Topic Date Due   OPHTHALMOLOGY EXAM  Never done   HIV Screening  Never done   Hepatitis C Screening  Never done   Pneumonia Vaccine 36+ Years old (2 of 2 - PCV) 01/25/2020   COVID-19 Vaccine (1 - 2024-25 season) Never done   Medicare Annual Wellness (AWV)  05/04/2023      Objective:     BP 111/73   Pulse 67   Ht 5\' 7"  (1.702 m)   Wt 179 lb (81.2 kg)   SpO2 96%   BMI 28.04 kg/m    Physical Exam General: Alert, oriented Pulmonary: No respiratory stress Psych: Pleasant affect    Assessment & Plan:   Diabetes mellitus type 2 in nonobese [facility]) Assessment & Plan: Patient A1c 8.2 today.  Has been on Ozempic again only for 2 weeks at 0.5 mg.  Still having issues with shipping from his manufacturer.  We have not received his Ozempic yet.  He also had issues with his insurance regarding the Lantus in which  she had to pay extra for the name brand instead of the generic for some reason even though the generic was prescribed. - Continue current medications: Metformin, glipizide, Lantus 28 units every morning - Adding Jardiance but if it is not affordable advised patient just to let us know. - His most recent 2-week report from his freestyle showed an estimate of his A1c for that time To be 7%.  If we are ever able to get his Ozempic he could likely be controlled with just the addition of this. - UACR today.  Orders: -     POCT glycosylated hemoglobin (Hb A1C) -     Microalbumin / creatinine urine ratio -     Empagliflozin; Take 1 tablet (10 mg total) by mouth daily before breakfast.  Dispense: 30 tablet; Refill: 2  Rotator cuff arthropathy of left shoulder Assessment & Plan: Has a history of right rotator cuff repair.  Now having pain with left rotator cuff.  Recommended topical heat and over-the-counter topical medications.  Provided patient with some home exercises he can do.  If pain persists would encourage him to discuss this with his orthopedist.   Mixed diabetic hyperlipidemia associated with type 2 diabetes mellitus (HCC) Assessment & Plan: Taking atorvastatin and fenofibrate.  Will recheck cholesterol level today.  Orders: -     Lipid panel; Future -     Comprehensive metabolic panel; Future  Hypertension associated with diabetes (HCC) Assessment & Plan: Blood pressure at goal.  Continue losartan.  Orders: -     Lipid panel; Future -     Comprehensive metabolic panel; Future     Return in about 3 months (around 08/19/2023) for DM.    Sandre Kitty, MD

## 2023-05-23 ENCOUNTER — Encounter: Payer: Self-pay | Admitting: Family Medicine

## 2023-05-23 LAB — MICROALBUMIN / CREATININE URINE RATIO
Creatinine, Urine: 101.7 mg/dL
Microalb/Creat Ratio: 10 mg/g{creat} (ref 0–29)
Microalbumin, Urine: 10.6 ug/mL

## 2023-05-23 LAB — LIPID PANEL
Chol/HDL Ratio: 5 {ratio} (ref 0.0–5.0)
Cholesterol, Total: 106 mg/dL (ref 100–199)
HDL: 21 mg/dL — ABNORMAL LOW (ref >39)
LDL Chol Calc (NIH): 51 mg/dL (ref 0–99)
Triglycerides: 208 mg/dL — ABNORMAL HIGH (ref 0–149)
VLDL Cholesterol Cal: 34 mg/dL (ref 5–40)

## 2023-05-23 LAB — COMPREHENSIVE METABOLIC PANEL
ALT: 25 [IU]/L (ref 0–44)
AST: 22 [IU]/L (ref 0–40)
Albumin: 4.5 g/dL (ref 3.9–4.9)
Alkaline Phosphatase: 92 [IU]/L (ref 44–121)
BUN/Creatinine Ratio: 19 (ref 10–24)
BUN: 20 mg/dL (ref 8–27)
Bilirubin Total: 0.2 mg/dL (ref 0.0–1.2)
CO2: 18 mmol/L — ABNORMAL LOW (ref 20–29)
Calcium: 10.3 mg/dL — ABNORMAL HIGH (ref 8.6–10.2)
Chloride: 109 mmol/L — ABNORMAL HIGH (ref 96–106)
Creatinine, Ser: 1.07 mg/dL (ref 0.76–1.27)
Globulin, Total: 2.8 g/dL (ref 1.5–4.5)
Glucose: 85 mg/dL (ref 70–99)
Potassium: 4.6 mmol/L (ref 3.5–5.2)
Sodium: 142 mmol/L (ref 134–144)
Total Protein: 7.3 g/dL (ref 6.0–8.5)
eGFR: 77 mL/min/{1.73_m2} (ref >59)

## 2023-05-23 NOTE — Assessment & Plan Note (Signed)
 Blood pressure at goal.  - Continue losartan

## 2023-05-23 NOTE — Assessment & Plan Note (Signed)
 Taking atorvastatin and fenofibrate.  Will recheck cholesterol level today.

## 2023-05-24 ENCOUNTER — Telehealth: Payer: Self-pay

## 2023-05-24 NOTE — Telephone Encounter (Signed)
 I called patient to let him know that his Ozempic was delivered to the office.

## 2023-05-24 NOTE — Telephone Encounter (Signed)
 Pt wife came to pick up medication

## 2023-05-30 ENCOUNTER — Other Ambulatory Visit: Payer: Self-pay | Admitting: Pharmacist

## 2023-05-30 NOTE — Progress Notes (Signed)
 05/30/2023 Name: Mario Arnold MRN: [id number] DOB: [birth year]  Chief Complaint  Patient presents with   Medication Management   Diabetes    Mario Arnold is a 68 y.o. year old male who presented for a telephone visit.   They were referred to the pharmacist by their PCP for assistance in managing diabetes.    Subjective:  Care Team: Primary Care Provider: Sandre Kitty, MD ; Next Scheduled Visit: 05/22/23 Clinical Pharmacist: Marlowe Aschoff, PharmD  Medication Access/Adherence  Current Pharmacy:  [facility] [address] ([address]), [place] - [address] [place] ([place]) [place] [address] Phone: [phone] Fax: [phone]   Patient reports affordability concerns with their medications: Yes - Ozempic Patient reports access/transportation concerns to their pharmacy: No  Patient reports adherence concerns with their medications:  No     Diabetes:  Current medications: Glipizide 10mg  twice a day, Lantus 28 U in AM, Metformin 500mg  4 tablets per day (max dose), Ozempic 0.5mg  weekly- injects Tuesdays Medications tried in the past: Trulicity 0.75mg  (backorder issues), Actos 45mg , Ozempic 1mg  (donut hole), Glyburide 10mg  twice a day (switch to Glipizide)         Freestyle Libre 3- uses Libreview app from 12/4 visit: % Time CGM is active: 12%- newly placed on 02/27/23 in PM Average Glucose: 323 mg/dL Glucose Management Indicator: N/A  Glucose Variability: 14.1% (goal <36%) Time in Goal:  - Very high: 90% - High: 10% - In target: 0% - Below: 0%  Patient denies hypoglycemic s/sx including dizziness, shakiness, sweating. Patient denies hyperglycemic symptoms including polyuria, polydipsia, polyphagia, nocturia, neuropathy, blurred vision.  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 (was walking during 12/10 call)  Current medication access support: [facility] Advantage  Hyperlipidemia/ASCVD Risk Reduction  Current lipid lowering medications: Atorvastatin 80mg , Zetia 10mg , Fenofibrate 160mg  Medications tried in the past: None  Antiplatelet regimen: Aspirin 81 mg  ASCVD History: TIA in 2013, CABG x3 in 2023 Family History: Mother MI + TIA Risk Factors: Diet, lifestyle   The ASCVD Risk score (Arnett DK, et al., 2019) failed to calculate for the following reasons:   Risk score cannot be calculated because patient has a medical history suggesting prior/existing ASCVD    Objective:  Lab Results  Component Value Date   HGBA1C 8.3 05/22/2023    Lab Results  Component Value Date   CREATININE 1.07 05/22/2023   BUN 20 05/22/2023   NA 142 05/22/2023   K 4.6 05/22/2023   CL 109 (H) 05/22/2023   CO2 18 (L) 05/22/2023    Lab Results  Component Value Date   CHOL 106 05/22/2023   HDL 21 (L) 05/22/2023   LDLCALC 51 05/22/2023   LDLDIRECT 55.5 05/07/2013   TRIG 208 (H) 05/22/2023   CHOLHDL 5.0 05/22/2023    Medications Reviewed Today   Medications were not reviewed in this encounter       Assessment/Plan:   Diabetes: - Currently uncontrolled - Reviewed long term cardiovascular and renal outcomes of  uncontrolled blood sugar - Reviewed goal A1c, goal fasting, and goal 2 hour post prandial glucose - Reviewed dietary modifications including limiting amount of toast and other carb-based foods - Recommend to check glucose with Freestyle Libre 3    Hyperlipidemia/ASCVD Risk Reduction: - Currently uncontrolled.  - Reviewed long term  complications of uncontrolled cholesterol - Reviewed dietary recommendations including limiting beef + bacon consumption and removing use of butter/oil in cooking - Advised TG are greatly improved with levels of 538 to 208 at recent check- will hopefully continue to decrease with assistance of Ozempic and DM control   Follow Up Plan:  - Follow-up on 07/11/23 for Libreview check + Ozempic titration  - Taking Ozempic 0.5mg  weekly now  - Unable to verify how many of each colored box he received due to driving back from the beach during call  - Advised if red box, will do 0.5mg  weekly for another 4 weeks and then start the blue box of 1mg  weekly for 4 weeks; confirmed understanding - Of note, BG readings have been higher since 05/26/23 due being at the beach and forgot metformin + glipizide- plans to take and restart once getting home - Will need visit scheduled for refill form for max dose once titration complete based on how original application was sent - Reviewed how each DM medication works- goal of removing Glipizide in the future  *Of note, reports having drug deductible for old plan but [facility] SNP plan will start at beginning of March- plans to bring card to pharmacy to see cost of Jardiance (advised he is controlled without this)  Marlowe Aschoff, PharmD [facility] Phone Number: [phone]

## 2023-06-12 ENCOUNTER — Other Ambulatory Visit: Payer: Self-pay | Admitting: Family Medicine

## 2023-06-12 DIAGNOSIS — E785 Hyperlipidemia, unspecified: Secondary | ICD-10-CM

## 2023-06-30 NOTE — Telephone Encounter (Signed)
 Copied from CRM [id number]. Topic: General - Other >> Jun 28, 2023 11:24 AM Pierre Bali B wrote: Reason for CRM: [employer] rep called in to get a diagnosis of the patient for stated insurance purposes . If she calls back transfer to Nurse Triage. >> Jun 28, 2023 11:29 AM Nyra Capes wrote: Call was dropped and [facility] is calling back

## 2023-07-11 ENCOUNTER — Other Ambulatory Visit: Payer: Self-pay | Admitting: Pharmacist

## 2023-07-11 DIAGNOSIS — E119 Type 2 diabetes mellitus without complications: Secondary | ICD-10-CM

## 2023-07-11 MED ORDER — FREESTYLE LIBRE 3 PLUS SENSOR MISC: 5 refills | Status: DC

## 2023-07-11 NOTE — Progress Notes (Signed)
 07/11/2023 Name: Mario Arnold MRN: [id number] DOB: 1955/06/28  Chief Complaint  Patient presents with   Diabetes    Mario Arnold is a 68 y.o. year old male who presented for a telephone visit.   They were referred to the pharmacist by their PCP for assistance in managing diabetes.    Subjective:  Care Team: Primary Care Provider: Sandre Kitty, MD ; Next Scheduled Visit: 05/22/23 Clinical Pharmacist: Marlowe Aschoff, PharmD  Medication Access/Adherence  Current Pharmacy:  [facility] [address] ([address]), [place] - [address] [place]) [place] [address] Phone: [phone] Fax: [phone]   Patient reports affordability concerns with their medications: Yes - Ozempic Patient reports access/transportation concerns to their pharmacy: No  Patient reports adherence concerns with their medications:  No     Diabetes:  Current medications: Glipizide 10mg  twice a day, Lantus 28 U in AM, Metformin 500mg  4 tablets per day (max dose), Ozempic 0.5mg  weekly- injects Tuesdays Medications tried in the past: Trulicity 0.75mg  (backorder issues), Actos 45mg , Ozempic 1mg  (donut hole), Glyburide 10mg  twice a day (switch to Glipizide)           Freestyle Libre 3- uses Libreview app from 12/4 visit: % Time CGM is active: 12%- newly placed on 02/27/23 in PM Average Glucose: 323 mg/dL Glucose Management Indicator: N/A  Glucose Variability: 14.1% (goal <36%) Time in Goal:  - Very high: 90% - High: 10% - In target: 0% - Below: 0%  Patient denies hypoglycemic s/sx including dizziness, shakiness, sweating. Patient denies hyperglycemic symptoms including polyuria, polydipsia, polyphagia, nocturia, neuropathy, blurred vision.  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 (was walking during 12/10 call)  Current medication access support: [facility] Advantage  Hyperlipidemia/ASCVD Risk Reduction  Current lipid lowering medications: Atorvastatin 80mg , Zetia 10mg , Fenofibrate 160mg  Medications tried in the past: None  Antiplatelet regimen: Aspirin 81 mg  ASCVD History: TIA in 2013, CABG x3 in 2023 Family History: Mother MI + TIA Risk Factors: Diet, lifestyle   The ASCVD Risk score (Arnett DK, et al., 2019) failed to calculate for the following reasons:   Risk score cannot be calculated because patient has a medical history suggesting prior/existing ASCVD    Objective:  Lab Results  Component Value Date   HGBA1C 8.3 05/22/2023    Lab Results  Component Value Date   CREATININE 1.07 05/22/2023   BUN 20 05/22/2023   NA 142 05/22/2023   K 4.6 05/22/2023   CL 109 (H) 05/22/2023   CO2 18 (L) 05/22/2023    Lab Results  Component Value Date   CHOL 106 05/22/2023   HDL 21 (L) 05/22/2023   LDLCALC 51 05/22/2023   LDLDIRECT 55.5 05/07/2013   TRIG 208 (H) 05/22/2023   CHOLHDL 5.0 05/22/2023    Medications Reviewed Today   Medications were not reviewed in this encounter       Assessment/Plan:   Diabetes: - Currently uncontrolled - Reviewed long term cardiovascular and renal outcomes of uncontrolled blood  sugar - Reviewed goal A1c, goal fasting, and goal 2 hour post prandial glucose - Reviewed dietary modifications including limiting amount of toast and other carb-based foods - Recommend to check glucose with Freestyle Libre 3    Hyperlipidemia/ASCVD Risk Reduction: - Currently controlled.  - Reviewed long term complications of uncontrolled  cholesterol - Reviewed dietary recommendations including limiting beef + bacon consumption and removing use of butter/oil in cooking   Follow Up Plan:  - Follow-up on 08/01/23 for Libreview check and see how removal of morning Glipizide went - STOP Glipizide 10mg  in the morning- still take Glipizide 10mg  in the afternoon  - Having lows at 1-2PM daily with 6% lows on sensor - Needs Atorvastatin refill and will send Rx for Libre 3 Plus- will ensure PA taken care of if needed at the pharmacy  - Needs new sensors by Monday (leaving town) - Taking Ozempic 0.5mg  weekly now  - Unable to verify how many of each colored box he received due to driving back from the beach during call  - Advised if red box, will do 0.5mg  weekly for another 4 weeks and then start the blue box of 1mg  weekly for 4 weeks; confirmed understanding - Will need visit scheduled for refill form for max dose once titration complete based on how original application was sent  Update from 4/17: Called pharmacy and had Libre 3 plus sensors + Atorvastatin ready for pick-up; patient will get by Monday when e leaves   *Of note, reports having drug deductible for old plan but [facility] SNP plan will start at beginning of March- plans to bring card to pharmacy to see cost of Jardiance (advised he is controlled without this)  Kenneth Peace, PharmD [facility] Phone Number: [phone]

## 2023-07-18 ENCOUNTER — Telehealth: Payer: Self-pay | Admitting: *Deleted

## 2023-07-18 DIAGNOSIS — E119 Type 2 diabetes mellitus without complications: Secondary | ICD-10-CM

## 2023-07-18 MED ORDER — INSULIN GLARGINE 100 UNIT/ML SOLOSTAR PEN: PEN_INJECTOR | SUBCUTANEOUS | 0 refills | Status: DC

## 2023-07-18 MED ORDER — PEN NEEDLES 31G X 5 MM MISC: 1 refills | Status: AC

## 2023-07-18 NOTE — Telephone Encounter (Signed)
Ill send it in

## 2023-07-18 NOTE — Telephone Encounter (Signed)
 Copied from CRM [id number]. Topic: Clinical - Medication Question >> Jul 18, 2023  8:07 AM Mario Arnold wrote: Reason for CRM: Patient went to the beach and left his  insulin  glargine (LANTUS ) 100 UNIT/ML Solostar Pen Insulin  Pen Needle (PEN NEEDLES) 31G X 5 MM MISC  At home mistakenly.He would like to know if he could have a weeks worth sent to pharmacy there.    [id number] [address] [place], [place] - [address]  Phone: [phone] Fax: [phone]

## 2023-07-20 ENCOUNTER — Other Ambulatory Visit: Payer: Self-pay | Admitting: Family Medicine

## 2023-07-20 DIAGNOSIS — E119 Type 2 diabetes mellitus without complications: Secondary | ICD-10-CM

## 2023-08-01 ENCOUNTER — Other Ambulatory Visit: Payer: Self-pay | Admitting: Pharmacist

## 2023-08-01 NOTE — Progress Notes (Signed)
 08/01/2023 Name: Mario Arnold MRN: [id number] DOB: 02-05-1956  Chief Complaint  Patient presents with   Diabetes    Mario Arnold is a 68 y.o. year old male who presented for a telephone visit.   They were referred to the pharmacist by their PCP for assistance in managing diabetes.    Subjective:  Care Team: Primary Care Provider: Laneta Pintos, MD ; Next Scheduled Visit: 05/22/23 Clinical Pharmacist: Kenneth Peace, PharmD  Medication Access/Adherence  Current Pharmacy:  [facility] [address] ([address]), [place] - [address] [place] ([place]) [place] [address] Phone: [phone] Fax: [phone]  [id number] [place], [place] - [address] [place] [address] Phone: [phone] Fax: [phone]   Patient reports affordability concerns with their medications: Yes - Ozempic  Patient reports access/transportation concerns to their pharmacy: No  Patient reports adherence concerns with their medications:  No     Diabetes:  Current medications: Glipizide  10mg  w/ breakfast, Lantus  28 U in AM, Metformin  500mg  4 tablets per day (max dose), Ozempic  0.5mg  weekly- injects Tuesdays (starting 1mg  on 5/13) Medications tried in the past: Trulicity  0.75mg  (backorder issues), Actos  45mg , Ozempic  1mg  (donut hole), Glyburide  10mg  twice a day (switch to Glipizide )            Freestyle Libre 3- uses Libreview app from 12/4 visit: % Time CGM is active: 12%- newly placed on 02/27/23 in PM Average Glucose: 323 mg/dL Glucose Management Indicator: N/A  Glucose Variability: 14.1% (goal <36%) Time in Goal:  - Very high: 90% - High: 10% - In target: 0% - Below: 0%  Patient denies hypoglycemic s/sx including dizziness, shakiness, sweating. Patient denies hyperglycemic symptoms including polyuria, polydipsia, polyphagia, nocturia, neuropathy, blurred vision.  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 (was walking during 12/10 call)  Current medication access support: [facility] Advantage  Hyperlipidemia/ASCVD Risk Reduction  Current lipid lowering medications: Atorvastatin  80mg , Zetia  10mg , Fenofibrate  160mg  Medications tried in the past: None  Antiplatelet regimen: Aspirin  81 mg  ASCVD History: TIA in 2013, CABG x3 in 2023 Family History: Mother MI + TIA Risk Factors: Diet, lifestyle   The ASCVD Risk score (Arnett DK, et al., 2019) failed to calculate for the following reasons:   Risk score cannot be calculated because patient has a medical history suggesting prior/existing ASCVD    Objective:  Lab Results  Component Value Date   HGBA1C 8.3 05/22/2023    Lab Results  Component Value Date   CREATININE 1.07 05/22/2023   BUN 20 05/22/2023   NA 142 05/22/2023   K 4.6 05/22/2023   CL 109 (H) 05/22/2023   CO2 18 (L) 05/22/2023    Lab Results  Component Value Date   CHOL 106 05/22/2023   HDL 21 (L) 05/22/2023   LDLCALC 51 05/22/2023   LDLDIRECT 55.5 05/07/2013   TRIG 208 (H) 05/22/2023   CHOLHDL 5.0 05/22/2023    Medications Reviewed Today   Medications were not reviewed in  this encounter       Assessment/Plan:   Diabetes: - Currently uncontrolled - Reviewed long term cardiovascular and renal outcomes of uncontrolled blood sugar - Reviewed goal A1c, goal fasting, and goal 2 hour post prandial glucose - Reviewed dietary modifications including limiting amount of toast and other carb-based foods -  Recommend to check glucose with Freestyle Libre 3    Hyperlipidemia/ASCVD Risk Reduction: - Currently controlled.  - Reviewed long term complications of uncontrolled cholesterol - Reviewed dietary recommendations including limiting beef + bacon consumption and removing use of butter/oil in cooking   Follow Up Plan:  - Follow-up on 08/16/23 for Libreview check and see how Ozempic  dose increase goes for 1mg  - Advised to stopped Glipizide  if having lows with dose increase - Continue Glipizide  1 tablet in the morning- less lows at 1-2PM since switching  - Plans to start the blue box of 1mg  weekly for 4 weeks and then yellow box of 2mg  weekly for 4 weeks; confirmed understanding - Will need visit scheduled for refill form for max dose once titration complete based on how original application was sent (submit on 6/12 for official dose after check-in on 2mg  dose)   Delvin File, PharmD [facility] Phone Number: [phone]

## 2023-08-02 ENCOUNTER — Other Ambulatory Visit: Payer: Self-pay | Admitting: Family Medicine

## 2023-08-02 ENCOUNTER — Telehealth: Payer: Self-pay | Admitting: *Deleted

## 2023-08-02 DIAGNOSIS — E119 Type 2 diabetes mellitus without complications: Secondary | ICD-10-CM

## 2023-08-02 MED ORDER — INSULIN GLARGINE 100 UNIT/ML SOLOSTAR PEN: PEN_INJECTOR | SUBCUTANEOUS | 0 refills | Status: DC

## 2023-08-02 NOTE — Telephone Encounter (Signed)
 Copied from CRM [id number]. Topic: Clinical - Prescription Issue >> Aug 02, 2023 10:00 AM Oddis Bench wrote: Reason for CRM: Patient is calling bc pharmacy stated that insurance denied his refill for Semaglutide ,0.25 or 0.5MG /DOS, (OZEMPIC , 0.25 OR 0.5 MG/DOSE,) 2 MG/3ML SOPN, he was informed to let office know to reach out to the insurance.

## 2023-08-02 NOTE — Telephone Encounter (Signed)
 Contacted pts pharmacy to inquire about this and apparently the pt had a Rx on file for the ozempic  that was written in February.  Pharmacy stated that this did not need a PA but that it does have a high copay. Please inform pt if he calls back.

## 2023-08-16 ENCOUNTER — Other Ambulatory Visit: Payer: Self-pay | Admitting: Pharmacist

## 2023-08-16 NOTE — Progress Notes (Signed)
 08/16/2023 Name: Mario Arnold MRN: [id number] DOB: [birth year]  Chief Complaint  Patient presents with   Diabetes    Mario Arnold is a 68 y.o. year old male who presented for a telephone visit.   They were referred to the pharmacist by their PCP for assistance in managing diabetes.    Subjective:  Care Team: Primary Care Provider: Laneta Pintos, MD ; Next Scheduled Visit: 10/04/23 Clinical Pharmacist: Delvin File, PharmD  Medication Access/Adherence  Current Pharmacy:  [facility] [address] ([address]), [place] - [address] [place] ([place]) [place] [address] Phone: [phone] Fax: [phone]  [id number] [place],  - [address] [place] [address] Phone: [phone] Fax: [phone]   Patient reports affordability concerns with their medications: Yes - Ozempic  Patient reports access/transportation concerns to their pharmacy: No  Patient reports adherence concerns with their medications:  No     Diabetes:  Current medications: Glipizide  10mg  w/ breakfast, Lantus  28 U in AM, Metformin  500mg  4 tablets per day (max dose), Ozempic  1mg  weekly- injects Tuesdays (starting 2mg  on 6/10) Medications tried in the past: Trulicity  0.75mg  (backorder issues), Actos  45mg , Ozempic  1mg  (donut hole), Glyburide  10mg  twice a day (switch to Glipizide )         Freestyle Libre 3- uses Libreview app from 12/4 visit: % Time CGM is active: 12%- newly placed on 02/27/23 in PM Average Glucose: 323 mg/dL Glucose Management Indicator: N/A  Glucose Variability: 14.1% (goal <36%) Time in Goal:  - Very high: 90% - High: 10% - In target: 0% - Below: 0%  Patient denies hypoglycemic s/sx including dizziness, shakiness, sweating. Patient denies hyperglycemic symptoms including polyuria, polydipsia, polyphagia, nocturia, neuropathy, blurred vision.  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 (was walking during 12/10 call)  Current medication access support: [facility] Advantage  Hyperlipidemia/ASCVD Risk Reduction  Current lipid lowering medications: Atorvastatin  80mg , Zetia  10mg , Fenofibrate  160mg  Medications tried in the past: None  Antiplatelet regimen: Aspirin  81 mg  ASCVD History: TIA in 2013, CABG x3 in 2023 Family History: Mother MI + TIA Risk Factors: Diet, lifestyle   The ASCVD Risk score (Arnett DK, et al., 2019) failed to calculate for the following reasons:   Risk score cannot be calculated because patient has a medical history suggesting prior/existing ASCVD    Objective:  Lab Results  Component Value Date   HGBA1C 8.3 05/22/2023    Lab Results  Component Value Date   CREATININE 1.07 05/22/2023   BUN 20 05/22/2023   NA 142 05/22/2023   K 4.6 05/22/2023   CL 109 (H) 05/22/2023   CO2 18 (L) 05/22/2023    Lab Results  Component Value Date   CHOL 106 05/22/2023   HDL 21 (L) 05/22/2023   LDLCALC 51 05/22/2023   LDLDIRECT 55.5 05/07/2013   TRIG 208 (H) 05/22/2023   CHOLHDL 5.0 05/22/2023    Medications Reviewed Today   Medications were not reviewed in this encounter  Assessment/Plan:   Diabetes: - Currently uncontrolled - Reviewed long term cardiovascular and renal outcomes of uncontrolled blood sugar - Reviewed goal A1c, goal fasting, and goal 2 hour post prandial glucose - Reviewed dietary modifications including limiting amount of toast and other carb-based foods - Recommend to  check glucose with Freestyle Libre 3    Hyperlipidemia/ASCVD Risk Reduction: - Currently controlled.  - Reviewed long term complications of uncontrolled cholesterol - Reviewed dietary recommendations including limiting beef + bacon consumption and removing use of butter/oil in cooking   Follow Up Plan:  - Follow-up on 09/05/23 for Libreview check and see how Ozempic  dose increase goes for 2mg  (will have just taken first dose)  - Will need to submit for new PAP refill for 2mg  if doing well  - Has 6 weeks left of Ozempic  currently - Advised to stopped Glipizide  if having lows with dose increase - Continue Glipizide  1 tablet in the morning- less lows at 1-2PM since switching - Will need visit scheduled for refill form for max dose once titration complete based on how original application was sent (submit on 6/12 for official dose after check-in on 2mg  dose)    Delvin File, PharmD [facility] Phone Number: [phone]

## 2023-08-17 ENCOUNTER — Ambulatory Visit: Admitting: Family Medicine

## 2023-08-18 ENCOUNTER — Ambulatory Visit: Payer: Medicare Other | Admitting: Family Medicine

## 2023-08-25 ENCOUNTER — Other Ambulatory Visit: Payer: Self-pay | Admitting: Family Medicine

## 2023-08-25 DIAGNOSIS — E119 Type 2 diabetes mellitus without complications: Secondary | ICD-10-CM

## 2023-09-06 ENCOUNTER — Other Ambulatory Visit: Payer: Self-pay | Admitting: Pharmacist

## 2023-09-06 NOTE — Progress Notes (Signed)
 09/06/2023 Name: Mario Arnold MRN: [id number] DOB: [birth year]  Chief Complaint  Patient presents with   Diabetes    Mario Arnold is a 68 y.o. year old male who presented for a telephone visit.   They were referred to the pharmacist by their PCP for assistance in managing diabetes.    Subjective:  Care Team: Primary Care Provider: Laneta Pintos, MD ; Next Scheduled Visit: 10/04/23 Clinical Pharmacist: Delvin File, PharmD  Medication Access/Adherence  Current Pharmacy:  [facility] [address] ([address]), [place] - [address] [place] ([place]) [place] [address] Phone: [phone] Fax: [phone]  [id number] [place], [place] - [address] [place] [address] Phone: [phone] Fax: [phone]   Patient reports affordability concerns with their medications: Yes - Ozempic  Patient reports access/transportation concerns to their pharmacy: No  Patient reports adherence concerns with their medications:  No     Diabetes:  Current medications: Glipizide  10mg  w/ dinner, Lantus  28 U in AM, Metformin  500mg  4 tablets per day (max dose), Ozempic  2mg  weekly- injects Tuesdays (started 2mg  on 6/10) Medications tried in the past: Trulicity  0.75mg  (backorder issues), Actos  45mg , Ozempic  1mg  (donut hole), Glyburide  10mg  twice a day (switch to Glipizide )   6/11 visit: A1c 6.7% (looks similar to prior)        Freestyle Libre 3- uses Libreview app from 12/4 visit: % Time CGM is active: 12%- newly placed on 02/27/23 in PM Average Glucose: 323 mg/dL Glucose Management Indicator: N/A  Glucose Variability: 14.1% (goal <36%) Time in Goal:  - Very high: 90% - High: 10% - In target: 0% - Below: 0%  Patient denies hypoglycemic s/sx including dizziness, shakiness, sweating. Patient denies hyperglycemic symptoms including polyuria, polydipsia, polyphagia, nocturia, neuropathy, blurred vision.  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 (was walking during 12/10 call)  Current medication access support: [facility] Advantage  Hyperlipidemia/ASCVD Risk Reduction  Current lipid lowering medications: Atorvastatin  80mg , Zetia  10mg , Fenofibrate  160mg  Medications tried in the past: None  Antiplatelet regimen: Aspirin  81 mg  ASCVD History: TIA in 2013, CABG x3 in 2023 Family History: Mother MI + TIA Risk Factors: Diet, lifestyle   The ASCVD Risk score (Arnett DK, et al., 2019) failed to calculate for the following reasons:   Risk score cannot be calculated because patient has a medical history suggesting prior/existing ASCVD    Objective:  Lab Results  Component Value Date   HGBA1C 8.3 05/22/2023    Lab Results  Component Value Date   CREATININE 1.07 05/22/2023   BUN 20 05/22/2023   NA 142 05/22/2023   K 4.6 05/22/2023   CL 109 (H) 05/22/2023   CO2 18 (L) 05/22/2023    Lab Results  Component Value Date   CHOL 106 05/22/2023   HDL 21 (L) 05/22/2023   LDLCALC 51 05/22/2023   LDLDIRECT 55.5 05/07/2013   TRIG 208 (H) 05/22/2023   CHOLHDL 5.0 05/22/2023    Medications Reviewed Today  Medications were not reviewed in this encounter       Assessment/Plan:   Diabetes: - Currently uncontrolled - Reviewed long term cardiovascular and renal outcomes of uncontrolled blood sugar - Reviewed goal A1c, goal fasting, and goal 2 hour post prandial glucose - Reviewed dietary modifications including limiting amount of  toast and other carb-based foods - Recommend to check glucose with Freestyle Libre 3    Hyperlipidemia/ASCVD Risk Reduction: - Currently controlled.  - Reviewed long term complications of uncontrolled cholesterol - Reviewed dietary recommendations including limiting beef + bacon consumption and removing use of butter/oil in cooking   Follow Up Plan:  - Follow-up on 6/25 to ensure less lows with Ozempic  2mg  and dose decrease to Lantus  26U daily  - Submitting new Ozempic  2mg  PAP refill form today - Decrease Lantus  to 24 U daily  - Patient had persistent lows starting last night with Ozempic  dose increase - Continue Glipizide  1 tablet in the morning- less lows at 1-2PM since switching (reports taking with dinner now on 6/11 visit) - Send new Lantus  refill at next call + Glipizide  dose change    Delvin File, PharmD [facility] Phone Number: [phone]

## 2023-09-08 ENCOUNTER — Telehealth: Payer: Self-pay | Admitting: Pharmacist

## 2023-09-08 NOTE — Progress Notes (Signed)
   09/08/2023  Patient ID: Mario Arnold, male   DOB: [birth year], 68 y.o.   MRN: [id number]  Did a quick check-in with the patient as I have been following his Mario Arnold due to recent lows in early morning since increasing the Ozempic  dose. His sensor died last night so he is planning to get another today.   Advised we discussed decreasing to 24U daily, but if still having lows tonight he can decrease to 20U and so forth as needed. Critical low of 53 at 1-4 AM yesterday. Unknown if due to sensor or true lows, but will monitor will new placement later today.    Mario Arnold, PharmD [facility]  Phone Number: [phone]

## 2023-09-18 ENCOUNTER — Other Ambulatory Visit: Payer: Self-pay | Admitting: Cardiology

## 2023-09-18 DIAGNOSIS — E785 Hyperlipidemia, unspecified: Secondary | ICD-10-CM

## 2023-09-20 ENCOUNTER — Other Ambulatory Visit: Payer: Self-pay | Admitting: Pharmacist

## 2023-09-20 DIAGNOSIS — E119 Type 2 diabetes mellitus without complications: Secondary | ICD-10-CM

## 2023-09-20 NOTE — Progress Notes (Signed)
 09/20/2023 Name: Mario Arnold MRN: [id number] DOB: [birth year]  Chief Complaint  Patient presents with   Diabetes    Mario Arnold is a 68 y.o. year old male who presented for a telephone visit.   They were referred to the pharmacist by their PCP for assistance in managing diabetes.    Subjective:  Care Team: Primary Care Provider: Chandra Toribio POUR, MD ; Next Scheduled Visit: 10/04/23 Clinical Pharmacist: Aloysius Lewis, PharmD  Medication Access/Adherence  Current Pharmacy:  [facility] [address] ([address]), [place] - [address] [place] ([place]) [place] [address] Phone: [phone] Fax: [phone]  [id number] [place], [place] - [address] [place] [address] Phone: [phone] Fax: [phone]   Patient reports affordability concerns with their medications: Yes - Ozempic  Patient reports access/transportation concerns to their pharmacy: No  Patient reports adherence concerns with their medications:  No     Diabetes:  Current medications: Glipizide  10mg  w/ breakfast, Lantus  20 U in AM, Metformin  500mg  4 tablets per day (max dose), Ozempic  2mg  weekly- injects Tuesdays (started 2mg  on 6/10) Medications tried in the past: Trulicity  0.75mg  (backorder issues), Actos  45mg , Ozempic  1mg  (donut hole), Glyburide  10mg  twice a day (switch to Glipizide )   6/11 visit: A1c 6.7% (looks similar to prior)        Freestyle Libre 3- uses Libreview app from 12/4 visit: % Time CGM is active: 12%- newly placed on 02/27/23 in PM Average Glucose: 323 mg/dL Glucose Management Indicator: N/A  Glucose Variability: 14.1% (goal <36%) Time in Goal:  - Very high: 90% - High: 10% - In target: 0% - Below: 0%  Patient denies hypoglycemic s/sx including dizziness, shakiness, sweating. Patient denies hyperglycemic symptoms including polyuria, polydipsia, polyphagia, nocturia, neuropathy, blurred vision.  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 (was walking during 12/10 call)  Current medication access support: [facility] Advantage  Hyperlipidemia/ASCVD Risk Reduction  Current lipid lowering medications: Atorvastatin  80mg , Zetia  10mg , Fenofibrate  160mg  Medications tried in the past: None  Antiplatelet regimen: Aspirin  81 mg  ASCVD History: TIA in 2013, CABG x3 in 2023 Family History: Mother MI + TIA Risk Factors: Diet, lifestyle   The ASCVD Risk score (Arnett DK, et al., 2019) failed to calculate for the following reasons:   Risk score cannot be calculated because patient has a medical history suggesting prior/existing ASCVD    Objective:  Lab Results  Component Value Date   HGBA1C 8.3 05/22/2023    Lab Results  Component Value Date   CREATININE 1.07 05/22/2023   BUN 20 05/22/2023   NA 142 05/22/2023   K 4.6 05/22/2023   CL 109 (H) 05/22/2023   CO2 18 (L) 05/22/2023    Lab Results  Component Value Date   CHOL 106 05/22/2023   HDL 21 (L) 05/22/2023   LDLCALC 51 05/22/2023   LDLDIRECT 55.5 05/07/2013   TRIG 208 (H) 05/22/2023   CHOLHDL 5.0 05/22/2023    Medications Reviewed Today  Medications were not reviewed in this encounter       Assessment/Plan:   Diabetes: - Currently uncontrolled - Reviewed long term cardiovascular and renal outcomes of uncontrolled blood sugar - Reviewed goal A1c, goal fasting, and goal 2 hour post prandial glucose - Reviewed dietary modifications including limiting amount of  toast and other carb-based foods - Recommend to check glucose with Freestyle Libre 3    Hyperlipidemia/ASCVD Risk Reduction: - Currently controlled.  - Reviewed long term complications of uncontrolled cholesterol - Reviewed dietary recommendations including limiting beef + bacon consumption and removing use of butter/oil in cooking   Follow Up Plan:  - Follow-up on 7/9 to ensure less lows with Ozempic  2mg  and dose decrease of Lantus  - Ozempic  refill PAP order should arrive by 6/26 to 7/2 at the office per call yesterday  - Refill order form will need submitted on 01/05/24 - Decrease Lantus  to 14U daily, then 10U daily if still having lows - Having mostly nighttime lows since increasing Ozempic  to 2mg  weekly + occasional post-breakfast lows (from Glipizide )   Aloysius Lewis, PharmD [facility] Phone Number: [phone]

## 2023-09-27 ENCOUNTER — Telehealth: Payer: Self-pay | Admitting: *Deleted

## 2023-09-27 NOTE — Telephone Encounter (Signed)
 Contacted pt to let him know that is patient assistance medication had arrived and he could pick it up.

## 2023-10-04 ENCOUNTER — Ambulatory Visit (INDEPENDENT_AMBULATORY_CARE_PROVIDER_SITE_OTHER): Admitting: Family Medicine

## 2023-10-04 ENCOUNTER — Encounter: Payer: Self-pay | Admitting: Family Medicine

## 2023-10-04 ENCOUNTER — Other Ambulatory Visit: Payer: Self-pay | Admitting: Pharmacist

## 2023-10-04 VITALS — BP 126/81 | HR 78 | Ht 67.0 in | Wt 173.4 lb

## 2023-10-04 DIAGNOSIS — E782 Mixed hyperlipidemia: Secondary | ICD-10-CM

## 2023-10-04 DIAGNOSIS — E1169 Type 2 diabetes mellitus with other specified complication: Secondary | ICD-10-CM

## 2023-10-04 DIAGNOSIS — G5793 Unspecified mononeuropathy of bilateral lower limbs: Secondary | ICD-10-CM | POA: Insufficient documentation

## 2023-10-04 DIAGNOSIS — M779 Enthesopathy, unspecified: Secondary | ICD-10-CM | POA: Diagnosis not present

## 2023-10-04 DIAGNOSIS — E119 Type 2 diabetes mellitus without complications: Secondary | ICD-10-CM

## 2023-10-04 LAB — POCT GLYCOSYLATED HEMOGLOBIN (HGB A1C): HbA1c POC (<> result, manual entry): 6.2 % (ref 4.0–5.6)

## 2023-10-04 MED ORDER — FREESTYLE LIBRE 3 PLUS SENSOR MISC: 5 refills | Status: AC

## 2023-10-04 MED ORDER — ATORVASTATIN CALCIUM 80 MG PO TABS: 80.0000 mg | ORAL_TABLET | Freq: Every day | ORAL | 1 refills | Status: DC

## 2023-10-04 NOTE — Progress Notes (Signed)
 10/04/2023 Name: Mario Arnold MRN: [id number] DOB: February 13, 1956  Chief Complaint  Patient presents with   Diabetes    Mario Arnold is a 68 y.o. year old male who presented for a telephone visit.   They were referred to the pharmacist by their PCP for assistance in managing diabetes.    Subjective:  Care Team: Primary Care Provider: Chandra Toribio POUR, MD ; Next Scheduled Visit: 01/16/24 Clinical Pharmacist: Aloysius Lewis, PharmD  Medication Access/Adherence  Current Pharmacy:  [facility] [address] ([address] [place] - [address] [place] ([place]) [place] [address] Phone: [phone] Fax: [phone]   Patient reports affordability concerns with their medications: Yes - Ozempic  Patient reports access/transportation concerns to their pharmacy: No  Patient reports adherence concerns with their medications:  No     Diabetes:  Current medications: Glipizide  10mg  w/ breakfast, Lantus  14 U in AM, Metformin  500mg  4 tablets per day (max dose), Ozempic  2mg  weekly- injects Tuesdays (started 2mg  on 6/10) Medications tried in the past: Trulicity  0.75mg  (backorder issues), Actos  45mg , Ozempic  1mg  (donut hole), Glyburide  10mg  twice a day (switch to Glipizide )    6/11 visit: A1c 6.7% (looks similar to prior)        Freestyle Libre 3- uses Libreview app from 12/4 visit: % Time CGM is active: 12%- newly placed on 02/27/23 in PM Average Glucose: 323 mg/dL Glucose Management Indicator: N/A  Glucose Variability: 14.1% (goal <36%) Time in Goal:  - Very high: 90% - High: 10% - In target: 0% - Below: 0%  Patient denies hypoglycemic s/sx including dizziness, shakiness, sweating. Patient denies hyperglycemic symptoms including polyuria, polydipsia, polyphagia, nocturia, neuropathy, blurred vision.  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 (was walking during 12/10 call)  Current medication access support: [facility] Advantage  Hyperlipidemia/ASCVD Risk Reduction  Current lipid lowering medications: Atorvastatin  80mg , Zetia  10mg , Fenofibrate  160mg  Medications tried in the past: None  Antiplatelet regimen: Aspirin  81 mg  ASCVD History: TIA in 2013, CABG x3 in 2023 Family History: Mother MI + TIA Risk Factors: Diet, lifestyle   The ASCVD Risk score (Arnett DK, et al., 2019) failed to calculate for the following reasons:   Risk score cannot be calculated because patient has a medical history suggesting prior/existing ASCVD    Objective:  Lab Results  Component Value Date   HGBA1C 6.2 10/04/2023    Lab Results  Component Value Date   CREATININE 1.07 05/22/2023   BUN 20 05/22/2023   NA 142 05/22/2023   K 4.6 05/22/2023   CL 109 (H) 05/22/2023   CO2 18 (L) 05/22/2023    Lab Results  Component Value Date   CHOL 106 05/22/2023   HDL 21 (L) 05/22/2023   LDLCALC 51 05/22/2023   LDLDIRECT 55.5 05/07/2013   TRIG 208 (H) 05/22/2023   CHOLHDL 5.0 05/22/2023    Medications Reviewed Today   Medications were not reviewed in this encounter       Assessment/Plan:   Diabetes: - Currently uncontrolled -  Reviewed long term cardiovascular and renal outcomes of uncontrolled blood sugar - Reviewed goal A1c, goal fasting, and goal 2 hour post prandial glucose - Reviewed dietary modifications including limiting amount of toast and other carb-based foods - Recommend to check glucose with Freestyle Libre 3    Hyperlipidemia/ASCVD Risk Reduction: -  Currently controlled.  - Reviewed long term complications of uncontrolled cholesterol - Reviewed dietary recommendations including limiting beef + bacon consumption and removing use of butter/oil in cooking   Follow Up Plan:  - Follow-up on 7/22 to see how his BG's look without insulin  - Ozempic  refill PAP order arrived on 7/2 for last refill  - Refill order form will need submitted on 01/05/24 - Will do 7 units of Lantus  daily and then remove completely in 1 week to provide comparison of readings    Aloysius Lewis, PharmD [facility] Phone Number: [phone]

## 2023-10-04 NOTE — Assessment & Plan Note (Signed)
 Mild paresthesia, also with some feeling of decreased strength in the toes/imbalance.  Will do further neuropathy workup but most likely related to diabetes.

## 2023-10-04 NOTE — Progress Notes (Signed)
 Established Patient Office Visit  Subjective   Patient ID: Mario Arnold, male    DOB: 05-Jul-1955  Age: 68 y.o. MRN: [id number]  Chief Complaint  Patient presents with   Medical Management of Chronic Issues    HPI    Patient is currently taking 2 mg of Ozempic  weekly.  No issues with this.  Does endorse decreased appetite.  Has decreased his glipizide  to once a day in the morning and decreased his Lantus  to 14 units.  This was because he was having lows at night.  Not taking Jardiance  currently.  Patient complaining of right heel numbness, specifically if he puts pressure on that area.  Also complaining of imbalance affecting his toes.  Feels like he has to scrunch his toes when he walks to keep his balance.   The ASCVD Risk score (Arnett DK, et al., 2019) failed to calculate for the following reasons:   Risk score cannot be calculated because patient has a medical history suggesting prior/existing ASCVD  Health Maintenance Due  Topic Date Due   OPHTHALMOLOGY EXAM  Never done   Hepatitis C Screening  Never done   Pneumococcal Vaccine: 50+ Years (2 of 2 - PCV) 01/25/2020   COVID-19 Vaccine (1 - 2024-25 season) Never done   Medicare Annual Wellness (AWV)  05/04/2023      Objective:     BP 126/81   Pulse 78   Ht 5' 7 (1.702 m)   Wt 173 lb 6.4 oz (78.7 kg)   SpO2 96%   BMI 27.16 kg/m    Physical Exam General: Alert, oriented Pulmonary: No respiratory distress Extremities: Normal monofilament testing.  Normal DP pulses bilaterally.  Proprioception intact in the great toe.  Normal strength in the toes bilaterally.   Results for orders placed or performed in visit on 10/04/23  POCT glycosylated hemoglobin (Hb A1C)  Result Value Ref Range   Hemoglobin A1C     HbA1c POC (<> result, manual entry) 6.2 4.0 - 5.6 %   HbA1c, POC (prediabetic range)     HbA1c, POC (controlled diabetic range)          Assessment & Plan:   Bone spur Assessment & Plan: Right  posterior calcaneus numbness likely related to bone spur.  Will order right foot x-ray to further evaluate.  Then refer to podiatry if appropriate.  Orders: -     DG Foot Complete Right; Future  Diabetes mellitus type 2 in nonobese [facility]) Assessment & Plan: Currently taking 2000 mg metformin  daily, glipizide  once daily, 14 units insulin  daily, Ozempic  2 mg.  A1c much better.  Now 6.2.  Will try to wean off the insulin  first and then hopefully get off glipizide  and may be replaced with Farxiga if affordable.  Decreasing insulin  to 7 units today, monitor estimated A1c at home and if after a month still under 7 we will stop taking Lantus  completely and follow-up with me in 2 months.  Orders: -     POCT glycosylated hemoglobin (Hb A1C) -     FreeStyle Libre 3 Plus Sensor; Use to check blood sugars continuously. Change sensor every 15 days. Dx code E11.9  Dispense: 2 each; Refill: 5 -     Hemoglobin A1c -     B12 and Folate Panel  Neuropathy involving both lower extremities Assessment & Plan: Mild paresthesia, also with some feeling of decreased strength in the toes/imbalance.  Will do further neuropathy workup but most likely related to diabetes.  Orders: -  B12 and Folate Panel -     Comprehensive metabolic panel with GFR  Mixed diabetic hyperlipidemia associated with type 2 diabetes mellitus (HCC) -     Lipid panel  Other orders -     Atorvastatin  Calcium ; Take 1 tablet (80 mg total) by mouth daily.  Dispense: 90 tablet; Refill: 1     Return in about 3 months (around 01/04/2024) for DM, neuropathy.    Toribio MARLA Slain, MD

## 2023-10-04 NOTE — Patient Instructions (Addendum)
 It was nice to see you today,  We addressed the following topics today: -I would like you to decrease your Lantus  from 14 to 7 units.  Your A1c was 6.2.  Eventually I would like you to come off of the Lantus .  After a month if you are estimated A1c or AGI on your freestyle herlene indicates you are still less than 7% for your A1c then you can stop taking the Lantus  completely and continue to monitor it. - I am ordering an x-ray of your foot to look for bone spurs.  Depending on the results I may recommend going to the podiatrist He is good to go  Have a great day,  Rolan Slain, MD

## 2023-10-04 NOTE — Assessment & Plan Note (Signed)
 Right posterior calcaneus numbness likely related to bone spur.  Will order right foot x-ray to further evaluate.  Then refer to podiatry if appropriate.

## 2023-10-04 NOTE — Assessment & Plan Note (Signed)
 Currently taking 2000 mg metformin  daily, glipizide  once daily, 14 units insulin  daily, Ozempic  2 mg.  A1c much better.  Now 6.2.  Will try to wean off the insulin  first and then hopefully get off glipizide  and may be replaced with Farxiga if affordable.  Decreasing insulin  to 7 units today, monitor estimated A1c at home and if after a month still under 7 we will stop taking Lantus  completely and follow-up with me in 2 months.

## 2023-10-17 ENCOUNTER — Other Ambulatory Visit: Payer: Self-pay | Admitting: Pharmacist

## 2023-10-17 NOTE — Progress Notes (Signed)
 10/17/2023 Name: Mario Arnold MRN: [id number] DOB: [birth year]  Chief Complaint  Patient presents with   Diabetes    Mario Arnold is a 68 y.o. year old male who presented for a telephone visit.   They were referred to the pharmacist by their PCP for assistance in managing diabetes.    Subjective:  Care Team: Primary Care Provider: Chandra Toribio POUR, MD ; Next Scheduled Visit: 01/16/24 Clinical Pharmacist: Aloysius Lewis, PharmD  Medication Access/Adherence  Current Pharmacy:  [facility] [address] ([address]),  - [address] [place] ([place]) [place] [address] Phone: [phone] Fax: [phone]   Patient reports affordability concerns with their medications: Yes - Ozempic  Patient reports access/transportation concerns to their pharmacy: No  Patient reports adherence concerns with their medications:  No     Diabetes:  Current medications: Glipizide  10mg  w/ breakfast, Lantus  7 U in AM, Metformin  500mg  4 tablets per day (max dose), Ozempic  2mg  weekly- injects Tuesdays (started 2mg  on 6/10) Medications tried in the past: Trulicity  0.75mg  (backorder issues), Actos  45mg , Ozempic  1mg  (donut hole), Glyburide  10mg  twice a day (switch to Glipizide )      6/11 visit: A1c 6.7% (looks similar to prior)        Freestyle Libre 3- uses Libreview app from 12/4 visit: % Time CGM is active: 12%- newly placed on 02/27/23 in PM Average Glucose: 323 mg/dL Glucose Management Indicator: N/A  Glucose Variability: 14.1% (goal <36%) Time in Goal:  - Very high: 90% - High: 10% - In target: 0% - Below: 0%  Patient denies hypoglycemic s/sx including dizziness, shakiness, sweating. Patient denies hyperglycemic symptoms including polyuria, polydipsia, polyphagia, nocturia, neuropathy, blurred vision.  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 (was walking during 12/10 call)  Current medication access support: [facility] Advantage  Hyperlipidemia/ASCVD Risk Reduction  Current lipid lowering medications: Atorvastatin  80mg , Zetia  10mg , Fenofibrate  160mg  Medications tried in the past: None  Antiplatelet regimen: Aspirin  81 mg  ASCVD History: TIA in 2013, CABG x3 in 2023 Family History: Mother MI + TIA Risk Factors: Diet, lifestyle   The ASCVD Risk score (Arnett DK, et al., 2019) failed to calculate for the following reasons:   Risk score cannot be calculated because patient has a medical history suggesting prior/existing ASCVD    Objective:  Lab Results  Component Value Date   HGBA1C 6.2 10/04/2023    Lab Results  Component Value Date   CREATININE 1.07 05/22/2023   BUN 20 05/22/2023   NA 142 05/22/2023   K 4.6 05/22/2023   CL 109 (H) 05/22/2023   CO2 18 (L) 05/22/2023    Lab Results  Component Value Date   CHOL 106 05/22/2023   HDL 21 (L) 05/22/2023   LDLCALC 51 05/22/2023   LDLDIRECT 55.5 05/07/2013   TRIG 208 (H) 05/22/2023   CHOLHDL 5.0 05/22/2023    Medications Reviewed Today   Medications were not reviewed in this encounter       Assessment/Plan:   Diabetes: -  Currently controlled - Reviewed long term cardiovascular and renal outcomes of uncontrolled blood sugar - Reviewed goal A1c, goal fasting, and goal 2 hour post prandial glucose - Reviewed dietary modifications including limiting amount of toast and other carb-based foods - Recommend to check glucose with Freestyle Libre 3    Hyperlipidemia/ASCVD Risk Reduction: -  Currently controlled.  - Reviewed long term complications of uncontrolled cholesterol - Reviewed dietary recommendations including limiting beef + bacon consumption and removing use of butter/oil in cooking   Follow Up Plan:  - Follow-up on 8/19 to see how his BG's look without insulin - starting on 7/23 - Ozempic  refill PAP order arrived on 7/2 for last refill  - Refill order form will need submitted on 01/05/24 - Was doing 7 U daily- will not remove it completely; no lows since insulin  decrease at last call  - Advised to keep insulin  in case he needs to restart it again    Aloysius Lewis, PharmD [facility] Phone Number: [phone]

## 2023-11-14 ENCOUNTER — Other Ambulatory Visit: Payer: Self-pay | Admitting: Pharmacist

## 2023-11-14 NOTE — Progress Notes (Signed)
 11/14/2023 Name: Mario Arnold MRN: [id number] DOB: Dec 17, 1955  Chief Complaint  Patient presents with   Diabetes    Mario Arnold is a 68 y.o. year old male who presented for a telephone visit.   They were referred to the pharmacist by their PCP for assistance in managing diabetes.    Subjective:  Care Team: Primary Care Provider: Chandra Toribio POUR, MD ; Next Scheduled Visit: 01/16/24 Clinical Pharmacist: Aloysius Lewis, PharmD  Medication Access/Adherence  Current Pharmacy:  [facility] [address] ([address]), [place] - [address] [place] ([place]) [place] [address] Phone: [phone] Fax: [phone]   Patient reports affordability concerns with their medications: Yes - Ozempic  Patient reports access/transportation concerns to their pharmacy: No  Patient reports adherence concerns with their medications:  No     Diabetes:  Current medications: Glipizide  10mg  w/ breakfast, Lantus  7 U in AM (restarting at 8/19 visit), Metformin  500mg  4 tablets per day (max dose), Ozempic  2mg  weekly- injects Tuesdays (started 2mg  on 6/10) Medications tried in the past: Trulicity  0.75mg  (backorder issues), Actos  45mg , Ozempic  1mg  (donut hole), Glyburide  10mg  twice a day (switch to Glipizide )       6/11 visit: A1c 6.7% (looks similar to prior)        Freestyle Libre 3- uses Libreview app from 12/4 visit: % Time CGM is active: 12%- newly placed on 02/27/23 in PM Average Glucose: 323 mg/dL Glucose Management Indicator: N/A  Glucose Variability: 14.1% (goal <36%) Time in Goal:  - Very high: 90% - High: 10% - In target: 0% - Below: 0%  Patient denies hypoglycemic s/sx including dizziness, shakiness, sweating. Patient denies hyperglycemic symptoms including polyuria, polydipsia, polyphagia, nocturia, neuropathy, blurred vision.  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 (was walking during 12/10 call)  Current medication access support: [facility] Advantage  Hyperlipidemia/ASCVD Risk Reduction  Current lipid lowering medications: Atorvastatin  80mg , Zetia  10mg , Fenofibrate  160mg  Medications tried in the past: None  Antiplatelet regimen: Aspirin  81 mg  ASCVD History: TIA in 2013, CABG x3 in 2023 Family History: Mother MI + TIA Risk Factors: Diet, lifestyle   The ASCVD Risk score (Arnett DK, et al., 2019) failed to calculate for the following reasons:   Risk score cannot be calculated because patient has a medical history suggesting prior/existing ASCVD    Objective:  Lab Results  Component Value Date   HGBA1C 6.2 10/04/2023    Lab Results  Component Value Date   CREATININE 1.07 05/22/2023   BUN 20 05/22/2023   NA 142 05/22/2023   K 4.6 05/22/2023   CL 109 (H) 05/22/2023   CO2 18 (L) 05/22/2023    Lab Results  Component Value Date   CHOL 106 05/22/2023   HDL 21 (L) 05/22/2023   LDLCALC 51 05/22/2023   LDLDIRECT 55.5 05/07/2013   TRIG 208 (H) 05/22/2023   CHOLHDL 5.0 05/22/2023    Medications Reviewed Today   Medications were not reviewed in this encounter  Assessment/Plan:   Diabetes: - Currently controlled - Reviewed long term cardiovascular and renal outcomes of uncontrolled blood sugar - Reviewed goal A1c, goal fasting, and goal 2 hour post prandial glucose - Reviewed dietary modifications including limiting amount of toast and other carb-based foods - Recommend to check glucose with Freestyle Libre 3     Hyperlipidemia/ASCVD Risk Reduction: - Currently controlled.  - Reviewed long term complications of uncontrolled cholesterol - Reviewed dietary recommendations including limiting beef + bacon consumption and removing use of butter/oil in cooking   Follow Up Plan:  - Follow-up on 9/9 to see how Herlene looks with 7U of insulin  daily again - Ozempic  refill PAP order arrived on 7/2 for last refill  - Refill order form will need submitted on 01/05/24 - Restart Lantus  7U in the morning    Aloysius Lewis, PharmD [facility] Phone Number: [phone]

## 2023-11-16 ENCOUNTER — Other Ambulatory Visit: Payer: Self-pay | Admitting: Family Medicine

## 2023-11-16 DIAGNOSIS — E119 Type 2 diabetes mellitus without complications: Secondary | ICD-10-CM

## 2023-12-02 ENCOUNTER — Other Ambulatory Visit: Payer: Self-pay | Admitting: Family Medicine

## 2023-12-02 DIAGNOSIS — E785 Hyperlipidemia, unspecified: Secondary | ICD-10-CM

## 2023-12-05 ENCOUNTER — Telehealth: Payer: Self-pay | Admitting: Pharmacist

## 2023-12-05 ENCOUNTER — Other Ambulatory Visit: Payer: Self-pay | Admitting: Pharmacist

## 2023-12-05 NOTE — Progress Notes (Signed)
   12/05/2023  Patient ID: Dewitt LITTIE Pencil, male   DOB: September 23, 1955, 68 y.o.   MRN: [id number]  Attempted to contact patient for scheduled appointment for diabetes. Left HIPAA compliant message for patient to return my call at their convenience- went straight to voicemail both attempts.  Will try again in 1 week.    Aloysius Lewis, PharmD [facility]  Phone Number: [phone]

## 2023-12-05 NOTE — Progress Notes (Signed)
 12/05/2023 Name: Mario Arnold MRN: [id number] DOB: [birth year]  Chief Complaint  Patient presents with   Diabetes    Mario Arnold is a 68 y.o. year old male who presented for a telephone visit.   They were referred to the pharmacist by their PCP for assistance in managing diabetes.    Subjective:  Care Team: Primary Care Provider: Chandra Toribio POUR, MD ; Next Scheduled Visit: 01/16/24 Clinical Pharmacist: Aloysius Lewis, PharmD  Medication Access/Adherence  Current Pharmacy:  [facility] [address] ([address]), [place] - [address] [place] ([place]) [place] [address] Phone: [phone] Fax: [phone]   Patient reports affordability concerns with their medications: Yes - Ozempic  Patient reports access/transportation concerns to their pharmacy: No  Patient reports adherence concerns with their medications:  No     Diabetes:  Current medications: Glipizide  10mg  w/ breakfast, Lantus  7 U in AM (restarting at 8/19 visit), Metformin  500mg  4 tablets per day (max dose), Ozempic  2mg  weekly- injects Tuesdays (started 2mg  on 6/10) Medications tried in the past: Trulicity  0.75mg  (backorder issues), Actos  45mg , Ozempic  1mg  (donut hole), Glyburide  10mg  twice a day (switch to Glipizide )        6/11 visit: A1c 6.7% (looks similar to prior)        Freestyle Libre 3- uses Libreview app from 12/4 visit: % Time CGM is active: 12%- newly placed on 02/27/23 in PM Average Glucose: 323 mg/dL Glucose Management Indicator: N/A  Glucose Variability: 14.1% (goal <36%) Time in Goal:  - Very high: 90% - High: 10% - In target: 0% - Below: 0%  Patient denies hypoglycemic s/sx including dizziness, shakiness, sweating. Patient denies hyperglycemic symptoms including polyuria, polydipsia, polyphagia, nocturia, neuropathy, blurred vision.  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 (was walking during 12/10 call)  Current medication access support: [facility] Advantage  Hyperlipidemia/ASCVD Risk Reduction  Current lipid lowering medications: Atorvastatin  80mg , Zetia  10mg , Fenofibrate  160mg  Medications tried in the past: None  Antiplatelet regimen: Aspirin  81 mg  ASCVD History: TIA in 2013, CABG x3 in 2023 Family History: Mother MI + TIA Risk Factors: Diet, lifestyle   The ASCVD Risk score (Arnett DK, et al., 2019) failed to calculate for the following reasons:   Risk score cannot be calculated because patient has a medical history suggesting prior/existing ASCVD    Objective:  Lab Results  Component Value Date   HGBA1C 6.2 10/04/2023    Lab Results  Component Value Date   CREATININE 1.07 05/22/2023   BUN 20 05/22/2023   NA 142 05/22/2023   K 4.6 05/22/2023   CL 109 (H) 05/22/2023   CO2 18 (L) 05/22/2023    Lab Results  Component Value Date   CHOL 106 05/22/2023   HDL 21 (L) 05/22/2023   LDLCALC 51 05/22/2023   LDLDIRECT 55.5 05/07/2013   TRIG 208 (H) 05/22/2023   CHOLHDL 5.0 05/22/2023    Medications Reviewed Today   Medications were not reviewed in this encounter  Assessment/Plan:   Diabetes: - Currently controlled - Reviewed long term cardiovascular and renal outcomes of uncontrolled blood sugar - Reviewed goal A1c, goal fasting, and goal 2 hour post prandial glucose - Reviewed dietary modifications including limiting amount of toast and other carb-based foods - Recommend to check glucose with Freestyle Libre 3     Hyperlipidemia/ASCVD Risk Reduction: - Currently controlled.  - Reviewed long term complications of uncontrolled cholesterol - Reviewed dietary recommendations including limiting beef + bacon consumption and removing use of butter/oil in cooking   Follow Up Plan:  - No follow-ups at this time- continue regular visits with PCP alone - Ozempic  refill PAP order arrived on 7/2 for last refill  - Refill order form will need submitted on 01/05/24 - Continue Lantus  7U in the morning - Several lows that are random- advised to continue treating them as they occur  - Only symptom is hunger when it drops    Aloysius Lewis, PharmD [facility] Phone Number: [phone]

## 2023-12-18 ENCOUNTER — Other Ambulatory Visit: Payer: Self-pay | Admitting: Family Medicine

## 2023-12-18 DIAGNOSIS — E119 Type 2 diabetes mellitus without complications: Secondary | ICD-10-CM

## 2023-12-23 ENCOUNTER — Other Ambulatory Visit: Payer: Self-pay | Admitting: Family Medicine

## 2024-01-08 ENCOUNTER — Telehealth: Payer: Self-pay | Admitting: *Deleted

## 2024-01-08 NOTE — Telephone Encounter (Signed)
 Contacted pt and informed him that his patient assistance medication was shipped here and would be in the fridge.

## 2024-01-09 ENCOUNTER — Other Ambulatory Visit

## 2024-01-15 ENCOUNTER — Other Ambulatory Visit: Payer: Self-pay | Admitting: *Deleted

## 2024-01-15 NOTE — Addendum Note (Signed)
 Addended by: ZIMMERMAN RUMPLE, Nassir Neidert D on: 01/15/2024 10:06 AM   Modules accepted: Orders

## 2024-01-16 ENCOUNTER — Other Ambulatory Visit: Payer: Self-pay | Admitting: Family Medicine

## 2024-01-16 ENCOUNTER — Ambulatory Visit: Admitting: Family Medicine

## 2024-01-16 DIAGNOSIS — E119 Type 2 diabetes mellitus without complications: Secondary | ICD-10-CM

## 2024-01-19 ENCOUNTER — Other Ambulatory Visit

## 2024-01-19 DIAGNOSIS — E1169 Type 2 diabetes mellitus with other specified complication: Secondary | ICD-10-CM

## 2024-01-19 DIAGNOSIS — G5793 Unspecified mononeuropathy of bilateral lower limbs: Secondary | ICD-10-CM

## 2024-01-19 DIAGNOSIS — E119 Type 2 diabetes mellitus without complications: Secondary | ICD-10-CM

## 2024-01-22 ENCOUNTER — Other Ambulatory Visit

## 2024-01-22 DIAGNOSIS — E782 Mixed hyperlipidemia: Secondary | ICD-10-CM | POA: Diagnosis not present

## 2024-01-22 DIAGNOSIS — E1169 Type 2 diabetes mellitus with other specified complication: Secondary | ICD-10-CM | POA: Diagnosis not present

## 2024-01-22 DIAGNOSIS — G5793 Unspecified mononeuropathy of bilateral lower limbs: Secondary | ICD-10-CM | POA: Diagnosis not present

## 2024-01-22 DIAGNOSIS — E119 Type 2 diabetes mellitus without complications: Secondary | ICD-10-CM | POA: Diagnosis not present

## 2024-01-23 ENCOUNTER — Ambulatory Visit: Payer: Self-pay | Admitting: Family Medicine

## 2024-01-23 LAB — COMPREHENSIVE METABOLIC PANEL WITH GFR
ALT: 24 IU/L (ref 0–44)
AST: 22 IU/L (ref 0–40)
Albumin: 4.3 g/dL (ref 3.9–4.9)
Alkaline Phosphatase: 82 IU/L (ref 47–123)
BUN/Creatinine Ratio: 16 (ref 10–24)
BUN: 25 mg/dL (ref 8–27)
Bilirubin Total: 0.3 mg/dL (ref 0.0–1.2)
CO2: 20 mmol/L (ref 20–29)
Calcium: 9.9 mg/dL (ref 8.6–10.2)
Chloride: 104 mmol/L (ref 96–106)
Creatinine, Ser: 1.6 mg/dL — ABNORMAL HIGH (ref 0.76–1.27)
Globulin, Total: 2.4 g/dL (ref 1.5–4.5)
Glucose: 145 mg/dL — ABNORMAL HIGH (ref 70–99)
Potassium: 4.5 mmol/L (ref 3.5–5.2)
Sodium: 138 mmol/L (ref 134–144)
Total Protein: 6.7 g/dL (ref 6.0–8.5)
eGFR: 47 mL/min/1.73 — ABNORMAL LOW (ref >59)

## 2024-01-23 LAB — LIPID PANEL
Chol/HDL Ratio: 5.9 ratio — ABNORMAL HIGH (ref 0.0–5.0)
Cholesterol, Total: 117 mg/dL (ref 100–199)
HDL: 20 mg/dL — ABNORMAL LOW (ref >39)
LDL Chol Calc (NIH): 36 mg/dL (ref 0–99)
Triglycerides: 418 mg/dL — ABNORMAL HIGH (ref 0–149)
VLDL Cholesterol Cal: 61 mg/dL — ABNORMAL HIGH (ref 5–40)

## 2024-01-23 LAB — HEMOGLOBIN A1C
Est. average glucose Bld gHb Est-mCnc: 151 mg/dL
Hgb A1c MFr Bld: 6.9 % — ABNORMAL HIGH (ref 4.8–5.6)

## 2024-01-23 LAB — B12 AND FOLATE PANEL
Folate: 8.5 ng/mL (ref >3.0)
Vitamin B-12: 326 pg/mL (ref 232–1245)

## 2024-01-25 ENCOUNTER — Ambulatory Visit

## 2024-01-25 VITALS — Ht 67.0 in | Wt 173.0 lb

## 2024-01-25 DIAGNOSIS — Z0000 Encounter for general adult medical examination without abnormal findings: Secondary | ICD-10-CM

## 2024-01-25 NOTE — Patient Instructions (Signed)
 Mr. Oestreich,  Thank you for taking the time for your Medicare Wellness Visit. I appreciate your continued commitment to your health goals. Please review the care plan we discussed, and feel free to reach out if I can assist you further.  Medicare recommends these wellness visits once per year to help you and your care team stay ahead of potential health issues. These visits are designed to focus on prevention, allowing your provider to concentrate on managing your acute and chronic conditions during your regular appointments.  Please note that Annual Wellness Visits do not include a physical exam. Some assessments may be limited, especially if the visit was conducted virtually. If needed, we may recommend a separate in-person follow-up with your provider.  Ongoing Care Seeing your primary care provider every 3 to 6 months helps us  monitor your health and provide consistent, personalized care.   Referrals If a referral was made during today's visit and you haven't received any updates within two weeks, please contact the referred provider directly to check on the status.  Recommended Screenings:  Health Maintenance  Topic Date Due   Eye exam for diabetics  Never done   Hepatitis C Screening  Never done   Pneumococcal Vaccine for age over 32 (2 of 2 - PCV) 01/25/2020   Flu Shot  10/27/2023   COVID-19 Vaccine (1 - 2025-26 season) Never done   Yearly kidney health urinalysis for diabetes  05/21/2024   Hemoglobin A1C  07/22/2024   Complete foot exam   10/03/2024   Yearly kidney function blood test for diabetes  01/21/2025   Medicare Annual Wellness Visit  01/24/2025   DTaP/Tdap/Td vaccine (2 - Td or Tdap) 04/02/2025   Colon Cancer Screening  12/04/2030   Zoster (Shingles) Vaccine  Completed   Meningitis B Vaccine  Aged Out       01/25/2024    3:38 PM  Advanced Directives  Does Patient Have a Medical Advance Directive? No   Advance Care Planning is important because it: Ensures you  receive medical care that aligns with your values, goals, and preferences. Provides guidance to your family and loved ones, reducing the emotional burden of decision-making during critical moments.  Vision: Annual vision screenings are recommended for early detection of glaucoma, cataracts, and diabetic retinopathy. These exams can also reveal signs of chronic conditions such as diabetes and high blood pressure.  Dental: Annual dental screenings help detect early signs of oral cancer, gum disease, and other conditions linked to overall health, including heart disease and diabetes.  Please see the attached documents for additional preventive care recommendations.

## 2024-01-25 NOTE — Progress Notes (Signed)
 Subjective:   Mario Arnold is a 68 y.o. who presents for a Medicare Wellness preventive visit.  As a reminder, Annual Wellness Visits don't include a physical exam, and some assessments may be limited, especially if this visit is performed virtually. We may recommend an in-person follow-up visit with your provider if needed.  Visit Complete: Virtual I connected with  Mario Arnold on 01/25/24 by a video and audio enabled telemedicine application and verified that I am speaking with the correct person using two identifiers.  Patient Location: Home  Provider Location: Home Office  I discussed the limitations of evaluation and management by telemedicine. The patient expressed understanding and agreed to proceed.  Vital Signs: Because this visit was a virtual/telehealth visit, some criteria may be missing or patient reported. Any vitals not documented were not able to be obtained and vitals that have been documented are patient reported.    Persons Participating in Visit: Patient.  AWV Questionnaire: No: Patient Medicare AWV questionnaire was not completed prior to this visit.  Cardiac Risk Factors include: advanced age (>43men, >47 women);diabetes mellitus;dyslipidemia;hypertension;male gender     Objective:    Today's Vitals   01/25/24 1531  Weight: 173 lb (78.5 kg)  Height: 5' 7 (1.702 m)   Body mass index is 27.1 kg/m.     01/25/2024    3:38 PM 10/11/2021   11:28 AM 09/24/2021    8:00 AM 12/04/2019   10:04 AM 06/25/2015    6:56 AM 06/24/2015    9:04 AM 09/05/2012    5:55 PM  Advanced Directives  Does Patient Have a Medical Advance Directive? No No No No No  No  Patient does not have advance directive;Patient would not like information   Copy of Healthcare Power of Attorney in Chart?     No - copy requested  No - copy requested    Would patient like information on creating a medical advance directive?  No - Patient declined No - Patient declined No - Patient declined No -  patient declined information  No - patient declined information    Pre-existing out of facility DNR order (yellow form or pink MOST form)       No      Data saved with a previous flowsheet row definition    Current Medications (verified) Outpatient Encounter Medications as of 01/25/2024  Medication Sig   acetaminophen  (TYLENOL ) 500 MG tablet Take 1,000 mg by mouth every 8 (eight) hours as needed for mild pain.   aspirin  EC 81 MG tablet Take 1 tablet (81 mg total) by mouth daily. Swallow whole.   atorvastatin  (LIPITOR ) 80 MG tablet Take 1 tablet (80 mg total) by mouth daily.   blood glucose meter kit and supplies Dispense based on patient and insurance preference. Use up to four times daily as directed. (FOR ICD-10 E10.9, E11.9).   Blood Glucose Monitoring Suppl (ACCU-CHEK GUIDE) w/Device KIT USE AS DIRECTED TO CHECK BLOOD SUGAR   Continuous Glucose Sensor (FREESTYLE LIBRE 3 PLUS SENSOR) MISC Use to check blood sugars continuously. Change sensor every 15 days. Dx code E11.9   ezetimibe  (ZETIA ) 10 MG tablet TAKE 1 TABLET BY MOUTH ONCE DAILY IN THE EVENING   fenofibrate  160 MG tablet Take 1 tablet by mouth once daily   glipiZIDE  (GLUCOTROL ) 10 MG tablet Take 1 tablet (10 mg total) by mouth daily before breakfast.   glucose blood (ACCU-CHEK GUIDE TEST) test strip Use to check BG 4 times a day as directed. Dx  E11.9- insulin  use   insulin  glargine (LANTUS ) 100 UNIT/ML Solostar Pen Inject 28 units into the skin daily. Titrate as directed by PCP. Max dose of 30 units daily.   Insulin  Pen Needle (PEN NEEDLES) 31G X 5 MM MISC Use with injection of Solostar insulin  pen.   Insulin  Pen Needle (RELION PEN NEEDLES) 31G X 6 MM MISC USE WITH SOLOSTAR PEN   losartan  (COZAAR ) 25 MG tablet Take 1 tablet (25 mg total) by mouth daily.   metFORMIN  (GLUCOPHAGE ) 500 MG tablet TAKE 2 TABLETS BY MOUTH IN THE MORNING AND 2 IN THE EVENING   metoprolol  tartrate (LOPRESSOR ) 25 MG tablet Take 1 tablet by mouth twice daily    Semaglutide , 2 MG/DOSE, (OZEMPIC , 2 MG/DOSE,) 8 MG/3ML SOPN Inject 2 mg into the skin once a week.   No facility-administered encounter medications on file as of 01/25/2024.    Allergies (verified) Nsaids and Dilaudid  [hydromorphone ]   History: Past Medical History:  Diagnosis Date   Arthritis    Carotid disease, bilateral 08/2011   less than 50% stenosis in the right and left    Chronic kidney disease 2017   stones   Coronary artery disease    STATUS POST ANGIOPLASTY OF THE LAD    Diabetes mellitus    TYPE 2   Dyslipidemia    History of kidney stones    History of transient ischemic attack (TIA)    on aggrenox    Hyperlipidemia    Myocardial infarction (HCC) 1999   stent placement- per patient has 2 stents   Stroke (HCC)    Thyroid  nodule 08/2011   had US  with 4 mm small cystic nodule noted. Needs follow up US  in 12 months. TSH is normal.    Past Surgical History:  Procedure Laterality Date   COLONOSCOPY     13 years ago in [facility] normal exam per pt   CORONARY ANGIOPLASTY WITH STENT PLACEMENT  03/28/2001   LAD. EJECTION FRACTION IS 45%   CORONARY ARTERY BYPASS GRAFT N/A 10/12/2021   Procedure: CORONARY ARTERY BYPASS GRAFTING (CABG) x3 USING LEFT INTERNAL MAMMARY ARTERY AND LEFT RADIAL ARM GRAFT;  Surgeon: Shyrl Linnie KIDD, MD;  Location: [facility] OR;  Service: Open Heart Surgery;  Laterality: N/A;   CORONARY PRESSURE/FFR STUDY N/A 09/24/2021   Procedure: INTRAVASCULAR PRESSURE WIRE/FFR STUDY;  Surgeon: Jordan, Peter M, MD;  Location: [facility] INVASIVE CV LAB;  Service: Cardiovascular;  Laterality: N/A;   LEFT HEART CATH AND CORONARY ANGIOGRAPHY N/A 09/24/2021   Procedure: LEFT HEART CATH AND CORONARY ANGIOGRAPHY;  Surgeon: Jordan, Peter M, MD;  Location: [facility] INVASIVE CV LAB;  Service: Cardiovascular;  Laterality: N/A;   RADIAL ARTERY HARVEST Left 10/12/2021   Procedure: RADIAL ARTERY HARVEST;  Surgeon: Shyrl Linnie KIDD, MD;  Location: [facility] OR;  Service: Open Heart Surgery;   Laterality: Left;   SHOULDER ARTHROSCOPY WITH OPEN ROTATOR CUFF REPAIR AND DISTAL CLAVICLE ACROMINECTOMY Right 09/05/2012   Procedure: RIGHT SHOULDER ARTHROSCOPY WITH LABRAL DEBRIDEMENT AND OPEN DISTAL CLAVICLE RESECTION, acromionectomy  AND OPEN ROTATOR CUFF REPAIR;  Surgeon: Lynwood SHAUNNA Bern, MD;  Location: [facility] ORS;  Service: Orthopedics;  Laterality: Right;   TEE WITHOUT CARDIOVERSION N/A 10/12/2021   Procedure: TRANSESOPHAGEAL ECHOCARDIOGRAM (TEE);  Surgeon: Shyrl Linnie KIDD, MD;  Location: [facility] OR;  Service: Open Heart Surgery;  Laterality: N/A;   Family History  Problem Relation Age of Onset   Coronary artery disease Mother    Heart attack Mother    Diabetes Mother    Transient ischemic attack  Mother    Heart disease Mother    Cancer Father    Colon cancer Neg Hx    Esophageal cancer Neg Hx    Colon polyps Neg Hx    Rectal cancer Neg Hx    Stomach cancer Neg Hx    Social History   Socioeconomic History   Marital status: Married    Spouse name: Kiam Bransfield   Number of children: 2   Years of education: Not on file   Highest education level: 10th grade  Occupational History   Occupation: Repair semi trailers.  Tobacco Use   Smoking status: Former    Current packs/day: 0.00    Average packs/day: 1.5 packs/day for 23.0 years (34.5 ttl pk-yrs)    Types: Cigarettes    Start date: 03/28/1974    Quit date: 03/28/1997    Years since quitting: 26.8   Smokeless tobacco: Former    Types: Snuff, Chew   Tobacco comments:    as a teen  Vaping Use   Vaping status: Never Used  Substance and Sexual Activity   Alcohol use: Yes    Alcohol/week: 2.0 standard drinks of alcohol    Types: 2 Cans of beer per week    Comment: socially   Drug use: No   Sexual activity: Not Currently    Partners: Female    Comment: MARRIED  Other Topics Concern   Not on file  Social History Narrative   Not on file   Social Drivers of Health   Financial Resource Strain: Low Risk  (01/25/2024)    Overall Financial Resource Strain (CARDIA)    Difficulty of Paying Living Expenses: Not hard at all  Food Insecurity: No Food Insecurity (01/25/2024)   Hunger Vital Sign    Worried About Running Out of Food in the Last Year: Never true    Ran Out of Food in the Last Year: Never true  Transportation Needs: No Transportation Needs (01/25/2024)   PRAPARE - Administrator, Civil Service (Medical): No    Lack of Transportation (Non-Medical): No  Physical Activity: Inactive (01/25/2024)   Exercise Vital Sign    Days of Exercise per Week: 0 days    Minutes of Exercise per Session: 0 min  Stress: No Stress Concern Present (01/25/2024)   [employer] of Occupational Health - Occupational Stress Questionnaire    Feeling of Stress: Not at all  Social Connections: Moderately Integrated (01/25/2024)   Social Connection and Isolation Panel    Frequency of Communication with Friends and Family: More than three times a week    Frequency of Social Gatherings with Friends and Family: Not on file    Attends Religious Services: 1 to 4 times per year    Active Member of [employer] or Organizations: No    Attends Engineer, Structural: Never    Marital Status: Married    Tobacco Counseling Counseling given: Not Answered Tobacco comments: as a teen    Clinical Intake:  Pre-visit preparation completed: Yes  Pain : No/denies pain     Nutritional Risks: None Diabetes: Yes CBG done?: No Did pt. bring in CBG monitor from home?: No  Lab Results  Component Value Date   HGBA1C 6.9 (H) 01/22/2024   HGBA1C 6.2 10/04/2023   HGBA1C 8.3 05/22/2023     How often do you need to have someone help you when you read instructions, pamphlets, or other written materials from your doctor or pharmacy?: 1 - Never  Interpreter Needed?: No  Information entered by :: NAllen LPN   Activities of Daily Living     01/25/2024    3:33 PM  In your present state of health, do you have any  difficulty performing the following activities:  Hearing? 0  Vision? 0  Difficulty concentrating or making decisions? 0  Walking or climbing stairs? 1  Dressing or bathing? 0  Doing errands, shopping? 0  Preparing Food and eating ? N  Using the Toilet? N  In the past six months, have you accidently leaked urine? N  Do you have problems with loss of bowel control? N  Managing your Medications? N  Managing your Finances? N  Housekeeping or managing your Housekeeping? N    Patient Care Team: Chandra Toribio POUR, MD as PCP - General (Family Medicine) Jordan, Peter M, MD as PCP - Cardiology (Cardiology)  I have updated your Care Teams any recent Medical Services you may have received from other providers in the past year.     Assessment:   This is a routine wellness examination for Mario Arnold.  Hearing/Vision screen Hearing Screening - Comments:: Denies hearing issues Vision Screening - Comments:: No regular eye exams   Goals Addressed             This Visit's Progress    Patient Stated       01/25/2024, denies goals       Depression Screen     01/25/2024    3:40 PM 10/04/2023    8:15 AM 05/22/2023    8:43 AM 02/17/2023    8:32 AM 11/15/2022    8:15 AM 08/15/2022   10:31 AM 05/03/2022    8:22 AM  PHQ 2/9 Scores  PHQ - 2 Score 0 0 0 0 0 3 0  PHQ- 9 Score 3 3 3 4  5 2     Fall Risk     01/25/2024    3:39 PM 02/17/2023    8:32 AM 05/03/2022    8:23 AM 05/11/2021    9:08 AM 02/11/2021   10:29 AM  Fall Risk   Falls in the past year? 0 0 0 0 0  Number falls in past yr: 0 0 0 0 0  Injury with Fall? 0 0 0 0 0  Risk for fall due to : Impaired balance/gait No Fall Risks     Follow up Falls evaluation completed;Falls prevention discussed Falls evaluation completed Falls evaluation completed Falls evaluation completed  Falls evaluation completed      Data saved with a previous flowsheet row definition    MEDICARE RISK AT HOME:  Medicare Risk at Home Any stairs in or around the  home?: No If so, are there any without handrails?: No Home free of loose throw rugs in walkways, pet beds, electrical cords, etc?: Yes Adequate lighting in your home to reduce risk of falls?: Yes Life alert?: No Use of a cane, walker or w/c?: Yes Grab bars in the bathroom?: No Shower chair or bench in shower?: No Elevated toilet seat or a handicapped toilet?: Yes  TIMED UP AND GO:  Was the test performed?  No  Cognitive Function: 6CIT completed        01/25/2024    3:42 PM  6CIT Screen  What Year? 0 points  What month? 0 points  What time? 0 points  Count back from 20 0 points  Months in reverse 2 points  Repeat phrase 4 points  Total Score 6 points    Immunizations Immunization History  Administered  Date(s) Administered   Fluzone Influenza virus vaccine,trivalent (IIV3), split virus 12/20/2013, 01/17/2015   Influenza,inj,Quad PF,6+ Mos 02/02/2017, 01/04/2018   Influenza,inj,quad, With Preservative 12/04/2015   Pneumococcal Polysaccharide-23 04/03/2015, 01/25/2019   Tdap 04/03/2015   Zoster Recombinant(Shingrix) 09/25/2019, 03/27/2020   Zoster, Live 05/29/2019, 08/29/2019    Screening Tests Health Maintenance  Topic Date Due   OPHTHALMOLOGY EXAM  Never done   Hepatitis C Screening  Never done   Pneumococcal Vaccine: 50+ Years (2 of 2 - PCV) 01/25/2020   Influenza Vaccine  10/27/2023   COVID-19 Vaccine (1 - 2025-26 season) Never done   Diabetic kidney evaluation - Urine ACR  05/21/2024   HEMOGLOBIN A1C  07/22/2024   FOOT EXAM  10/03/2024   Diabetic kidney evaluation - eGFR measurement  01/21/2025   Medicare Annual Wellness (AWV)  01/24/2025   DTaP/Tdap/Td (2 - Td or Tdap) 04/02/2025   Colonoscopy  12/04/2030   Zoster Vaccines- Shingrix  Completed   Meningococcal B Vaccine  Aged Out    Health Maintenance Items Addressed: Declines vaccines. States will make an eye appointment.  Additional Screening:  Vision Screening: Recommended annual ophthalmology  exams for early detection of glaucoma and other disorders of the eye. Is the patient up to date with their annual eye exam?  No  Who is the provider or what is the name of the office in which the patient attends annual eye exams? none  Dental Screening: Recommended annual dental exams for proper oral hygiene  Community Resource Referral / Chronic Care Management: CRR required this visit?  No   CCM required this visit?  No   Plan:    I have personally reviewed and noted the following in the patient's chart:   Medical and social history Use of alcohol, tobacco or illicit drugs  Current medications and supplements including opioid prescriptions. Patient is not currently taking opioid prescriptions. Functional ability and status Nutritional status Physical activity Advanced directives List of other physicians Hospitalizations, surgeries, and ER visits in previous 12 months Vitals Screenings to include cognitive, depression, and falls Referrals and appointments  In addition, I have reviewed and discussed with patient certain preventive protocols, quality metrics, and best practice recommendations. A written personalized care plan for preventive services as well as general preventive health recommendations were provided to patient.   Ardella FORBES Dawn, LPN   [id number]/7974   After Visit Summary: (MyChart) Due to this being a telephonic visit, the after visit summary with patients personalized plan was offered to patient via MyChart   Notes: Nothing significant to report at this time.

## 2024-01-28 ENCOUNTER — Other Ambulatory Visit: Payer: Self-pay | Admitting: Family Medicine

## 2024-01-28 DIAGNOSIS — E119 Type 2 diabetes mellitus without complications: Secondary | ICD-10-CM

## 2024-02-12 ENCOUNTER — Ambulatory Visit (INDEPENDENT_AMBULATORY_CARE_PROVIDER_SITE_OTHER): Admitting: Family Medicine

## 2024-02-12 ENCOUNTER — Encounter: Payer: Self-pay | Admitting: Family Medicine

## 2024-02-12 VITALS — BP 118/74 | HR 72 | Temp 98.4°F | Ht 67.0 in | Wt 176.0 lb

## 2024-02-12 DIAGNOSIS — E781 Pure hyperglyceridemia: Secondary | ICD-10-CM | POA: Diagnosis not present

## 2024-02-12 DIAGNOSIS — E119 Type 2 diabetes mellitus without complications: Secondary | ICD-10-CM

## 2024-02-12 DIAGNOSIS — G4700 Insomnia, unspecified: Secondary | ICD-10-CM | POA: Insufficient documentation

## 2024-02-12 MED ORDER — OMEGA-3-ACID ETHYL ESTERS 1 G PO CAPS: 2.0000 g | ORAL_CAPSULE | Freq: Two times a day (BID) | ORAL | 2 refills | Status: AC

## 2024-02-12 NOTE — Progress Notes (Signed)
 Established Patient Office Visit  Subjective   Patient ID: Mario Arnold, male    DOB: 1956/01/29  Age: 68 y.o. MRN: [id number]  Chief Complaint  Patient presents with   Medical Management of Chronic Issues    HPI  Subjective - Reports issues with Freestyle Libre 3 Plus continuous glucose monitor. States the last couple of sensors have stopped working 2-3 days early. The current one lost signal for about an hour before reconnecting. Denies using a separate reader, uses phone app. Will check for app updates. If issues persist, may consider switching to Dexcom.  - Reports difficulty staying asleep. Tosses and turns frequently, wakes up easily when turning over. If awake at 2 or 3 AM, finds it difficult to fall back asleep. Typically gets 4-5 hours of sleep per night. Denies issues falling asleep. Denies taking any current sleep aids. Discussed OTC options including melatonin and magnesium , noting they are more effective for sleep onset. Discussed Trazodone and Doxepin as prescription options. Patient prefers to try OTC options first. Counseled on risks of Benadryl , including anticholinergic side effects (confusion, drowsiness, falls, dry mouth/eyes), especially with advanced age.  - Reports occasional upset stomach but denies it is bothersome or painful.  Medications Current medications include insulin  8 units every morning, Ozempic  on Tuesday mornings, glipizide , metformin , atorvastatin , Zetia , and a fibrate.  PMH, PSH, FH, Social Hx PMHx: Type 2 Diabetes Mellitus, Hypertriglyceridemia. Prior use of Lovaza  (omega-3 fish oil), stopped for unknown reasons.  ROS Constitutional: Denies feeling tired. Sleep: Reports difficulty with sleep maintenance, frequent awakenings. GI: Reports occasional upset stomach. Denies pain.    The ASCVD Risk score (Arnett DK, et al., 2019) failed to calculate for the following reasons:   Risk score cannot be calculated because patient has a medical history  suggesting prior/existing ASCVD  Health Maintenance Due  Topic Date Due   OPHTHALMOLOGY EXAM  Never done   Hepatitis C Screening  Never done   Pneumococcal Vaccine: 50+ Years (2 of 2 - PCV) 01/25/2020   COVID-19 Vaccine (1 - 2025-26 season) Never done      Objective:     BP 118/74   Pulse 72   Temp 98.4 F (36.9 C) (Oral)   Ht 5' 7 (1.702 m)   Wt 176 lb 0.6 oz (79.9 kg)   SpO2 96%   BMI 27.57 kg/m    Physical Exam Gen: alert, oriented Pulm: no respiratory distress Psych: pleasant affect   No results found for any visits on 02/12/24.      Assessment & Plan:   Hypertriglyceridemia Assessment & Plan: - Elevated triglycerides in the 400s despite treatment with a fibrate, atorvastatin , and Zetia . LDL is well-controlled. - Restart Lovaza  to be taken in addition to current lipid-lowering therapy. Counseled that Lovaza  may slightly increase LDL, but this is not a concern given the very low baseline LDL.   Early awakening Assessment & Plan: - Reports chronic difficulty with sleep maintenance. Prefers to try OTC options before starting a prescription. - Discussed Trazodone as a potential future prescription option if OTC remedies are not effective.   Diabetes mellitus type 2 in nonobese [facility]) Assessment & Plan: - History of T2DM, with recent A1c of 6.9% after a decrease in insulin . Currently on insulin , Ozempic , glipizide , and metformin . - Continue current insulin  dose of 8 units daily. - Defer further insulin  adjustments at this time. - Plan to add Farxiga in January when it becomes a psychologist, forensic. This may allow for discontinuation of insulin  and  glipizide  in the future. - Will send a prescription for Farxiga to be filled starting 03/28/2024.   Other orders -     Omega-3-acid  Ethyl Esters; Take 2 capsules (2 g total) by mouth 2 (two) times daily.  Dispense: 360 capsule; Refill: 2     Return in about 3 months (around 05/14/2024) for DM.    Mario MARLA Slain, MD

## 2024-02-12 NOTE — Patient Instructions (Signed)
 It was nice to see you today,  We addressed the following topics today:  - Please check to see if your [facility] phone app needs to be updated. - You can try over-the-counter melatonin (up to 5mg ) or magnesium  (400 mg) for sleep. You can use these as needed. - If the over-the-counter options do not help your sleep, please let me know and I can send in a prescription for Trazodone. - I am sending in a prescription for Lovaza  for your triglycerides. Please add this to your current medications. - I am sending in a prescription for Farxiga for your diabetes. You will be able to pick this up and start taking it in January. - We will schedule your next visit for early next year, likely February or March.  Have a great day,  Rolan Slain, MD

## 2024-02-12 NOTE — Assessment & Plan Note (Signed)
-   History of T2DM, with recent A1c of 6.9% after a decrease in insulin . Currently on insulin , Ozempic , glipizide , and metformin . - Continue current insulin  dose of 8 units daily. - Defer further insulin  adjustments at this time. - Plan to add Farxiga in January when it becomes a psychologist, forensic. This may allow for discontinuation of insulin  and glipizide  in the future. - Will send a prescription for Farxiga to be filled starting 03/28/2024.

## 2024-02-12 NOTE — Assessment & Plan Note (Signed)
-   Reports chronic difficulty with sleep maintenance. Prefers to try OTC options before starting a prescription. - Discussed Trazodone as a potential future prescription option if OTC remedies are not effective.

## 2024-02-12 NOTE — Assessment & Plan Note (Signed)
-   Elevated triglycerides in the 400s despite treatment with a fibrate, atorvastatin , and Zetia . LDL is well-controlled. - Restart Lovaza  to be taken in addition to current lipid-lowering therapy. Counseled that Lovaza  may slightly increase LDL, but this is not a concern given the very low baseline LDL.

## 2024-02-16 ENCOUNTER — Other Ambulatory Visit: Payer: Self-pay | Admitting: Family Medicine

## 2024-02-16 DIAGNOSIS — I25119 Atherosclerotic heart disease of native coronary artery with unspecified angina pectoris: Secondary | ICD-10-CM

## 2024-02-21 ENCOUNTER — Other Ambulatory Visit: Payer: Self-pay | Admitting: Cardiology

## 2024-03-31 ENCOUNTER — Other Ambulatory Visit: Payer: Self-pay | Admitting: Family Medicine

## 2024-04-30 ENCOUNTER — Other Ambulatory Visit: Payer: Self-pay

## 2024-04-30 DIAGNOSIS — E119 Type 2 diabetes mellitus without complications: Secondary | ICD-10-CM

## 2024-05-02 ENCOUNTER — Other Ambulatory Visit: Payer: Self-pay | Admitting: Family Medicine

## 2024-05-02 ENCOUNTER — Telehealth: Payer: Self-pay

## 2024-05-02 DIAGNOSIS — E119 Type 2 diabetes mellitus without complications: Secondary | ICD-10-CM

## 2024-05-02 MED ORDER — INSULIN GLARGINE 100 UNIT/ML SOLOSTAR PEN: PEN_INJECTOR | SUBCUTANEOUS | 2 refills | Status: AC

## 2024-05-02 NOTE — Telephone Encounter (Signed)
 Lantus was sent in

## 2024-05-02 NOTE — Telephone Encounter (Signed)
 Copied from CRM [id number]. Topic: Clinical - Medication Refill >> May 02, 2024  9:49 AM Darshell M wrote: Medication: insulin  glargine (LANTUS ) 100 UNIT/ML Solostar Pen  Has the patient contacted their pharmacy? Yes (Agent: If no, request that the patient contact the pharmacy for the refill. If patient does not wish to contact the pharmacy document the reason why and proceed with request.) (Agent: If yes, when and what did the pharmacy advise?)  This is the patient's preferred pharmacy:  [facility] [address] ([address] [place] - [address] [place] ([place]) [place] [address] Phone: [phone] Fax: [phone]  Is this the correct pharmacy for this prescription? Yes If no, delete pharmacy and type the correct one.   Has the prescription been filled recently? No  Is the patient out of the medication? Yes  Has the patient been seen for an appointment in the last year OR does the patient have an upcoming appointment? Yes  Can we respond through MyChart? No  Agent: Please be advised that Rx refills may take up to 3 business days. We ask that you follow-up with your pharmacy.

## 2024-05-02 NOTE — Telephone Encounter (Signed)
 Copied from CRM [id number]. Topic: Clinical - Prescription Issue >> May 02, 2024  9:52 AM Darshell M wrote: Reason for CRM: Patient reporting that he picks up Ozempic  from the office and he took his last dose today. Patient following up if provider still wants him to take the Ozempic  and if he should pick up his next does. Semaglutide , 2 MG/DOSE, (OZEMPIC , 2 MG/DOSE,) 8 MG/3ML SOPN. Patient CB# [phone]   -

## 2024-05-02 NOTE — Telephone Encounter (Signed)
 It looks like he received ozempic  through a patient assistance program and picked his medication up from us .  Do you know how I would go about reordering it?

## 2024-05-03 NOTE — Telephone Encounter (Signed)
 Yes, I would appreciate that.  Thank you. Do I need to put in the Trulicity  order first?

## 2024-05-07 ENCOUNTER — Other Ambulatory Visit

## 2024-05-13 ENCOUNTER — Other Ambulatory Visit

## 2024-05-14 ENCOUNTER — Ambulatory Visit: Admitting: Family Medicine

## 2024-05-21 ENCOUNTER — Ambulatory Visit: Admitting: Family Medicine
